# Patient Record
Sex: Female | Born: 1950
Health system: Southern US, Community
[De-identification: ages and names within clinical notes are randomized; demographics above are authoritative.]

## PROBLEM LIST (undated history)

## (undated) DIAGNOSIS — R112 Nausea with vomiting, unspecified: Secondary | ICD-10-CM

## (undated) DIAGNOSIS — N924 Excessive bleeding in the premenopausal period: Secondary | ICD-10-CM

## (undated) DIAGNOSIS — R0600 Dyspnea, unspecified: Secondary | ICD-10-CM

## (undated) DIAGNOSIS — E119 Type 2 diabetes mellitus without complications: Secondary | ICD-10-CM

## (undated) DIAGNOSIS — I10 Essential (primary) hypertension: Secondary | ICD-10-CM

## (undated) DIAGNOSIS — J45909 Unspecified asthma, uncomplicated: Secondary | ICD-10-CM

## (undated) DIAGNOSIS — R600 Localized edema: Secondary | ICD-10-CM

## (undated) DIAGNOSIS — K219 Gastro-esophageal reflux disease without esophagitis: Secondary | ICD-10-CM

## (undated) DIAGNOSIS — E782 Mixed hyperlipidemia: Secondary | ICD-10-CM

## (undated) DIAGNOSIS — G4730 Sleep apnea, unspecified: Secondary | ICD-10-CM

## (undated) DIAGNOSIS — M797 Fibromyalgia: Secondary | ICD-10-CM

## (undated) DIAGNOSIS — Z9889 Other specified postprocedural states: Secondary | ICD-10-CM

## (undated) DIAGNOSIS — R079 Chest pain, unspecified: Secondary | ICD-10-CM

## (undated) HISTORY — DX: Essential (primary) hypertension: I10

## (undated) HISTORY — DX: Excessive bleeding in the premenopausal period: N92.4

## (undated) HISTORY — DX: Fibromyalgia: M79.7

## (undated) HISTORY — PX: CHOLECYSTECTOMY: SHX55

## (undated) HISTORY — DX: Chest pain, unspecified: R07.9

## (undated) HISTORY — DX: Dyspnea, unspecified: R06.00

## (undated) HISTORY — PX: ABDOMINAL HYSTERECTOMY: SHX81

## (undated) HISTORY — DX: Mixed hyperlipidemia: E78.2

## (undated) HISTORY — PX: DILATION AND CURETTAGE OF UTERUS: SHX78

## (undated) HISTORY — DX: Gastro-esophageal reflux disease without esophagitis: K21.9

## (undated) HISTORY — PX: TONSILLECTOMY: SUR1361

## (undated) HISTORY — DX: Localized edema: R60.0

## (undated) HISTORY — DX: Unspecified asthma, uncomplicated: J45.909

## (undated) HISTORY — DX: Type 2 diabetes mellitus without complications: E11.9

---

## 2007-12-17 ENCOUNTER — Emergency Department (HOSPITAL_COMMUNITY): Admission: EM | Admit: 2007-12-17 | Discharge: 2007-12-17 | Payer: Self-pay | Admitting: Internal Medicine

## 2011-07-06 LAB — CREATININE, SERUM
Creatinine, Ser: 1.23 — ABNORMAL HIGH
GFR calc Af Amer: 55 — ABNORMAL LOW
GFR calc non Af Amer: 46 — ABNORMAL LOW

## 2014-12-31 ENCOUNTER — Emergency Department (INDEPENDENT_AMBULATORY_CARE_PROVIDER_SITE_OTHER): Payer: 59

## 2014-12-31 ENCOUNTER — Encounter (HOSPITAL_COMMUNITY): Payer: Self-pay | Admitting: Emergency Medicine

## 2014-12-31 ENCOUNTER — Emergency Department (HOSPITAL_COMMUNITY)
Admission: EM | Admit: 2014-12-31 | Discharge: 2014-12-31 | Disposition: A | Discharge status: Home / Self Care | Payer: 59 | Source: Home / Self Care | Attending: Emergency Medicine | Admitting: Emergency Medicine

## 2014-12-31 DIAGNOSIS — S52511A Displaced fracture of right radial styloid process, initial encounter for closed fracture: Secondary | ICD-10-CM

## 2014-12-31 HISTORY — DX: Essential (primary) hypertension: I10

## 2014-12-31 HISTORY — DX: Type 2 diabetes mellitus without complications: E11.9

## 2014-12-31 NOTE — Discharge Instructions (Signed)
You have a broken wrist. Take Tylenol or ibuprofen for pain. Follow-up with the orthopedic doctor later this week.

## 2014-12-31 NOTE — Progress Notes (Signed)
Orthopedic Tech Progress Note Patient Details:  Ebony Stevens Apr 07, 1951 [id number] Applied fiberglass sugar tong splint to RUE.  Pulses, sensation, motion intact before and after splinting.  Capillary refill less than 2 seconds before and after splinting.  Placed splinted RUE in arm sling. Ortho Devices Type of Ortho Device: Arm sling, Sugartong splint Ortho Device/Splint Location: RUE Ortho Device/Splint Interventions: Application   Darrol Poke 12/31/2014, 8:40 PM

## 2014-12-31 NOTE — ED Provider Notes (Signed)
CSN: [id number]     Arrival date & time 12/31/14  1733 History   First MD Initiated Contact with Patient 12/31/14 1905     Chief Complaint  Patient presents with  . Wrist Injury   (Consider location/radiation/quality/duration/timing/severity/associated sxs/prior Treatment) HPI She is a 64 year old woman here for evaluation of right wrist pain. She states she tripped and fell on Friday landing on her outstretched hands.  She states she sprained her right ankle and also fell on her right ribs. The wrist is the worst of the pain. She has been wearing brace over the weekend and has taken some extra strength Tylenol with minimal improvement. 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 There is swelling of the wrist.  Past Medical History  Diagnosis Date  . Hypertension   . Diabetes mellitus without complication    Past Surgical History  Procedure Laterality Date  . Cholecystectomy    . Abdominal hysterectomy    . Tonsillectomy     History reviewed. No pertinent family history. History  Substance Use Topics  . Smoking status: Never Smoker   . Smokeless tobacco: Never Used  . Alcohol Use: No   OB History    No data available     Review of Systems As in history of present illness Allergies  Review of patient's allergies indicates no known allergies.  Home Medications   Prior to Admission medications   Medication Sig Start Date End Date Taking? Authorizing Provider  glipiZIDE (GLUCOTROL) 5 MG tablet Take 5 mg by mouth 2 (two) times daily before a meal.   Yes Historical Provider, MD  lisinopril (PRINIVIL,ZESTRIL) 20 MG tablet Take 20 mg by mouth daily.   Yes Historical Provider, MD  metFORMIN (GLUMETZA) 1000 MG (MOD) 24 hr tablet Take 1,000 mg by mouth 2 (two) times daily with a meal.   Yes Historical Provider, MD  metoprolol succinate (TOPROL-XL) 50 MG 24 hr tablet Take 50 mg by mouth daily. Take with or immediately following  a meal.   Yes Historical Provider, MD   BP 176/68 mmHg  Pulse 58  Temp(Src) 98.5 F (36.9 C) (Oral)  Resp 15  SpO2 100% Physical Exam  Constitutional: She is oriented to person, place, and time. She appears well-developed and well-nourished. No distress.  Cardiovascular: Normal rate.   Pulmonary/Chest: Effort normal.  Musculoskeletal:  Right hand: She has bruising and swelling over the dorsal hand. She is also tender over the carpal bones. Brisk cap refill. Right wrist: She has some mild swelling on the radial side. She is exquisitely tender over the radial styloid.  Neurological: She is alert and oriented to person, place, and time.    ED Course  Procedures (including critical care time) Labs Review Labs Reviewed - No data to display  Imaging Review Dg Wrist Complete Right  12/31/2014   CLINICAL DATA:  Status post fall a few days ago with bruising of the wrist  EXAM: RIGHT WRIST - COMPLETE 3+ VIEW  COMPARISON:  None.  FINDINGS: There is mild displaced fracture of the distal radius. There is no dislocation.  IMPRESSION: Mild displaced fracture of distal radius.   Electronically Signed   By: Abelardo Diesel M.D.   On: 12/31/2014 19:46   Dg Hand Complete Right  12/31/2014   CLINICAL DATA:  Hand injury  EXAM: RIGHT HAND - COMPLETE 3+ VIEW  COMPARISON:  None.  FINDINGS: There is displaced fracture of the distal radius.  There is no dislocation. No other fracture is identified.  IMPRESSION: Displaced fracture of the distal radius.   Electronically Signed   By: Abelardo Diesel M.D.   On: 12/31/2014 19:52     MDM   1. Radial styloid fracture, right, closed, initial encounter    We'll have Ortho tech place sugar tong splint. She will follow-up with Dr. Lorin Mercy, orthopedic surgeon on call, later this week.    Melony Overly, MD 12/31/14 2000

## 2014-12-31 NOTE — ED Notes (Signed)
Pt fell on Friday and sustained several injuries including her wrist.  She is unable to use the wrist or lift anything.

## 2015-07-11 DIAGNOSIS — R079 Chest pain, unspecified: Secondary | ICD-10-CM

## 2015-07-11 DIAGNOSIS — R0600 Dyspnea, unspecified: Secondary | ICD-10-CM

## 2015-07-11 HISTORY — DX: Dyspnea, unspecified: R06.00

## 2015-07-11 HISTORY — DX: Chest pain, unspecified: R07.9

## 2016-03-27 DIAGNOSIS — E782 Mixed hyperlipidemia: Secondary | ICD-10-CM

## 2016-03-27 DIAGNOSIS — R0609 Other forms of dyspnea: Secondary | ICD-10-CM | POA: Insufficient documentation

## 2016-03-27 DIAGNOSIS — L259 Unspecified contact dermatitis, unspecified cause: Secondary | ICD-10-CM | POA: Insufficient documentation

## 2016-03-27 DIAGNOSIS — R600 Localized edema: Secondary | ICD-10-CM

## 2016-03-27 DIAGNOSIS — E119 Type 2 diabetes mellitus without complications: Secondary | ICD-10-CM | POA: Insufficient documentation

## 2016-03-27 DIAGNOSIS — F341 Dysthymic disorder: Secondary | ICD-10-CM | POA: Insufficient documentation

## 2016-03-27 DIAGNOSIS — E669 Obesity, unspecified: Secondary | ICD-10-CM

## 2016-03-27 DIAGNOSIS — R05 Cough: Secondary | ICD-10-CM | POA: Insufficient documentation

## 2016-03-27 DIAGNOSIS — I10 Essential (primary) hypertension: Secondary | ICD-10-CM | POA: Insufficient documentation

## 2016-03-27 DIAGNOSIS — R079 Chest pain, unspecified: Secondary | ICD-10-CM | POA: Insufficient documentation

## 2016-03-27 DIAGNOSIS — J3089 Other allergic rhinitis: Secondary | ICD-10-CM

## 2016-03-27 DIAGNOSIS — R0789 Other chest pain: Secondary | ICD-10-CM

## 2016-03-27 DIAGNOSIS — R0600 Dyspnea, unspecified: Secondary | ICD-10-CM

## 2016-03-27 DIAGNOSIS — F329 Major depressive disorder, single episode, unspecified: Secondary | ICD-10-CM | POA: Insufficient documentation

## 2016-03-27 DIAGNOSIS — G4700 Insomnia, unspecified: Secondary | ICD-10-CM | POA: Insufficient documentation

## 2016-03-27 DIAGNOSIS — M797 Fibromyalgia: Secondary | ICD-10-CM

## 2016-03-27 DIAGNOSIS — J209 Acute bronchitis, unspecified: Secondary | ICD-10-CM | POA: Insufficient documentation

## 2016-03-27 DIAGNOSIS — J309 Allergic rhinitis, unspecified: Secondary | ICD-10-CM | POA: Insufficient documentation

## 2016-03-27 DIAGNOSIS — R1010 Upper abdominal pain, unspecified: Secondary | ICD-10-CM | POA: Insufficient documentation

## 2016-03-27 DIAGNOSIS — R059 Cough, unspecified: Secondary | ICD-10-CM | POA: Insufficient documentation

## 2016-03-27 DIAGNOSIS — N959 Unspecified menopausal and perimenopausal disorder: Secondary | ICD-10-CM

## 2016-03-27 DIAGNOSIS — K219 Gastro-esophageal reflux disease without esophagitis: Secondary | ICD-10-CM | POA: Insufficient documentation

## 2016-03-27 DIAGNOSIS — J45909 Unspecified asthma, uncomplicated: Secondary | ICD-10-CM | POA: Insufficient documentation

## 2016-03-27 DIAGNOSIS — F411 Generalized anxiety disorder: Secondary | ICD-10-CM

## 2016-04-20 ENCOUNTER — Ambulatory Visit: Payer: Self-pay | Admitting: Internal Medicine

## 2016-04-24 ENCOUNTER — Ambulatory Visit: Payer: Self-pay | Admitting: Internal Medicine

## 2016-04-30 ENCOUNTER — Ambulatory Visit: Payer: Self-pay | Admitting: Cardiology

## 2016-06-05 ENCOUNTER — Ambulatory Visit: Payer: Self-pay | Admitting: Internal Medicine

## 2016-06-05 ENCOUNTER — Encounter (INDEPENDENT_AMBULATORY_CARE_PROVIDER_SITE_OTHER): Payer: Self-pay

## 2016-06-05 ENCOUNTER — Encounter: Payer: Self-pay | Admitting: Internal Medicine

## 2016-06-05 ENCOUNTER — Ambulatory Visit (INDEPENDENT_AMBULATORY_CARE_PROVIDER_SITE_OTHER): Payer: BLUE CROSS/BLUE SHIELD | Admitting: Internal Medicine

## 2016-06-05 VITALS — BP 128/70 | HR 62 | Ht 62.0 in | Wt 253.0 lb

## 2016-06-05 DIAGNOSIS — R0602 Shortness of breath: Secondary | ICD-10-CM | POA: Diagnosis not present

## 2016-06-05 DIAGNOSIS — Z79899 Other long term (current) drug therapy: Secondary | ICD-10-CM

## 2016-06-05 DIAGNOSIS — Z136 Encounter for screening for cardiovascular disorders: Secondary | ICD-10-CM

## 2016-06-05 NOTE — Patient Instructions (Signed)
Your physician recommends that you schedule a follow-up appointment in: to be determined after test   Your physician has requested that you have an echocardiogram. Echocardiography is a painless test that uses sound waves to create images of your heart. It provides your doctor with information about the size and shape of your heart and how well your heart's chambers and valves are working. This procedure takes approximately one hour. There are no restrictions for this procedure.     Thank you for choosing [facility] !

## 2016-06-05 NOTE — Progress Notes (Signed)
Cardiology Office Note   Date:  06/05/2016   ID:  Ebony Stevens, DOB [birth year], MRN [id number]  PCP:  Guy Begin, [place]  Cardiologist:   Dorris Carnes, MD   Pt referred by [facility] for edema   History of Present Illness: Ebony Stevens is a 65 y.o. female with a history of edema Pt says it began about 1 year ago   Occasional CP  Not associated with activity   Does get SOB with activity Watches salt intake  Seen in medicine clinic recently  Labs drawn  Discussed echo  Not done LDL is 97  HDL 58  Renal function normal  TSH and CBC not sent  A1C 7.6       Outpatient Medications Prior to Visit  Medication Sig Dispense Refill  . glipiZIDE (GLUCOTROL) 5 MG tablet Take 5 mg by mouth 2 (two) times daily before a meal.    . lisinopril (PRINIVIL,ZESTRIL) 20 MG tablet Take 20 mg by mouth daily.    . metFORMIN (GLUMETZA) 1000 MG (MOD) 24 hr tablet Take 1,000 mg by mouth 2 (two) times daily with a meal.    . metoprolol succinate (TOPROL-XL) 50 MG 24 hr tablet Take 50 mg by mouth daily. Take with or immediately following a meal.     No facility-administered medications prior to visit.      Allergies:   Review of patient's allergies indicates no known allergies.   Past Medical History:  Diagnosis Date  . Diabetes mellitus without complication ([place])   . Hypertension     Past Surgical History:  Procedure Laterality Date  . ABDOMINAL HYSTERECTOMY    . CHOLECYSTECTOMY    . TONSILLECTOMY       Social History:  The patient  reports that she has never smoked. She has never used smokeless tobacco. She reports that she does not drink alcohol or use drugs.   Family History:  The patient's family history includes Cancer in her sister; Diabetes in her brother.    ROS:  Please see the history of present illness. All other systems are reviewed and  Negative to the above problem except as noted.    PHYSICAL EXAM: VS:  BP 128/70 (BP Location: Left Arm)   Pulse 62   Ht 5\' 2"   (1.575 m)   Wt 253 lb (114.8 kg)   SpO2 94%   BMI 46.27 kg/m   GEN: Well nourished, well developed, in no acute distress  HEENT: normal  Neck: no JVD, carotid bruits, or masses Cardiac: RRR; no murmurs, rubs, or gallops,1+ edema  Respiratory:  clear to auscultation bilaterally, normal work of breathing GI: soft, nontender, nondistended, + BS  No hepatomegaly  MS: no deformity Moving all extremities   Skin: warm and dry, no rash Neuro:  Strength and sensation are intact Psych: euthymic mood, full affect   EKG:  EKG is ordered today.  SB 48 bpm  Sl ST depression, biphasic T waves II, III, AVF, V5 V6   Lipid Panel No results found for: CHOL, TRIG, HDL, CHOLHDL, VLDL, LDLCALC, LDLDIRECT    Wt Readings from Last 3 Encounters:  06/05/16 253 lb (114.8 kg)      ASSESSMENT AND PLAN: 1  Edema  Volume status on exam overall is not bad  I would get labs from [facility] (TSH and CBC)  I would also set up forecho  Watch salt   Activity as tolerated  2.  DM  A1C 7.6  Could be better  Discussed  diet  3.  HL  Lipids are not bad  Follow    F/U will be based on test results       Signed, Dorris Carnes, MD  06/05/2016 10:29 AM    [facility] [address], [place], [place]  [address] Phone: [phone]; Fax: [phone]

## 2016-06-08 ENCOUNTER — Encounter: Payer: Self-pay | Admitting: Internal Medicine

## 2016-06-18 ENCOUNTER — Ambulatory Visit (HOSPITAL_COMMUNITY)
Admission: RE | Admit: 2016-06-18 | Discharge: 2016-06-18 | Disposition: A | Discharge status: Home / Self Care | Payer: BLUE CROSS/BLUE SHIELD | Source: Ambulatory Visit | Attending: Internal Medicine | Admitting: Internal Medicine

## 2016-06-18 DIAGNOSIS — K219 Gastro-esophageal reflux disease without esophagitis: Secondary | ICD-10-CM | POA: Insufficient documentation

## 2016-06-18 DIAGNOSIS — E119 Type 2 diabetes mellitus without complications: Secondary | ICD-10-CM | POA: Diagnosis not present

## 2016-06-18 DIAGNOSIS — I119 Hypertensive heart disease without heart failure: Secondary | ICD-10-CM | POA: Diagnosis not present

## 2016-06-18 DIAGNOSIS — R0602 Shortness of breath: Secondary | ICD-10-CM | POA: Insufficient documentation

## 2016-06-18 DIAGNOSIS — E785 Hyperlipidemia, unspecified: Secondary | ICD-10-CM | POA: Insufficient documentation

## 2016-06-18 DIAGNOSIS — Z6841 Body Mass Index (BMI) 40.0 and over, adult: Secondary | ICD-10-CM | POA: Diagnosis not present

## 2016-06-18 DIAGNOSIS — R0600 Dyspnea, unspecified: Secondary | ICD-10-CM | POA: Diagnosis present

## 2016-06-18 LAB — ECHOCARDIOGRAM COMPLETE
CHL CUP MV DEC (S): 239
CHL CUP RV SYS PRESS: 24 mmHg
CHL CUP STROKE VOLUME: 38 mL
E decel time: 239 msec
EERAT: 15.24
FS: 41 % (ref 28–44)
IV/PV OW: 1.05
LA diam index: 1.82 cm/m2
LA vol A4C: 42.1 ml
LA vol index: 20.5 mL/m2
LASIZE: 42 mm
LAVOL: 47.4 mL
LDCA: 2.84 cm2
LEFT ATRIUM END SYS DIAM: 42 mm
LV TDI E'LATERAL: 7.94
LV sys vol index: 7 mL/m2
LV sys vol: 17 mL (ref 14–42)
LVDIAVOL: 54 mL (ref 46–106)
LVDIAVOLIN: 24 mL/m2
LVEEAVG: 15.24
LVEEMED: 15.24
LVELAT: 7.94 cm/s
LVOT SV: 92 mL
LVOT VTI: 32.3 cm
LVOT diameter: 19 mm
LVOT peak grad rest: 6 mmHg
LVOTPV: 124 cm/s
MV pk E vel: 121 m/s
MVPG: 6 mmHg
MVPKAVEL: 66 m/s
PW: 13 mm — AB (ref 0.6–1.1)
RV LATERAL S' VELOCITY: 11.9 cm/s
Reg peak vel: 227 cm/s
Simpson's disk: 69
TAPSE: 24.3 mm
TDI e' medial: 5.11
TR max vel: 227 cm/s

## 2016-06-18 NOTE — Progress Notes (Signed)
*  PRELIMINARY RESULTS* Echocardiogram 2D Echocardiogram has been performed.  Ebony Stevens 06/18/2016, 2:08 PM

## 2016-06-22 MED ORDER — FUROSEMIDE 40 MG PO TABS: 40.0000 mg | ORAL_TABLET | ORAL | 2 refills | Status: DC

## 2016-06-22 MED ORDER — POTASSIUM CHLORIDE ER 10 MEQ PO TBCR: 10.0000 meq | EXTENDED_RELEASE_TABLET | ORAL | 2 refills | Status: DC

## 2016-06-22 NOTE — Telephone Encounter (Signed)
Called pt., no answer- left message for pt to return my call. Sent in RX for lasix 40 mg & potassium 10 mg to take every other day. Also put in lab orders for labs to be done in two weeks.

## 2016-06-22 NOTE — Telephone Encounter (Signed)
-----   Message from Fay Records, MD sent at 06/20/2016  8:45 PM EDT ----- Echo shows normal pumping function of hte heart  No signif valve abnormalitiees INcreased filling pressuress I would recomm a trial of lasix 40 mg with 10 KCL every other day Check BMET and BNP in 2 wks

## 2016-06-23 ENCOUNTER — Telehealth: Payer: Self-pay

## 2016-06-23 NOTE — Telephone Encounter (Signed)
E-scribed increased lasix dose,mailed lab slips,lm for pt to call back

## 2016-06-23 NOTE — Telephone Encounter (Signed)
-----   Message from Fay Records, MD sent at 06/20/2016  8:45 PM EDT ----- Echo shows normal pumping function of hte heart  No signif valve abnormalitiees INcreased filling pressuress I would recomm a trial of lasix 40 mg with 10 KCL every other day Check BMET and BNP in 2 wks

## 2016-06-29 ENCOUNTER — Telehealth: Payer: Self-pay | Admitting: Internal Medicine

## 2016-06-29 ENCOUNTER — Other Ambulatory Visit: Payer: Self-pay

## 2016-06-29 NOTE — Telephone Encounter (Signed)
Sent My chart message from Dr Ross,e-scribed meds and place lab slips

## 2016-06-29 NOTE — Telephone Encounter (Signed)
Pt called returning Cathey's call

## 2016-06-30 NOTE — Telephone Encounter (Signed)
I left vm and told her her instructions from Dr Harrington Challenger were sent to [facility] Chart

## 2016-07-13 ENCOUNTER — Other Ambulatory Visit: Payer: Self-pay

## 2016-07-16 LAB — BRAIN NATRIURETIC PEPTIDE: BRAIN NATRIURETIC PEPTIDE: 46.7 pg/mL (ref <100)

## 2016-07-18 LAB — BASIC METABOLIC PANEL
BUN: 20 mg/dL (ref 7–25)
CALCIUM: 8.3 mg/dL — AB (ref 8.6–10.4)
CO2: 27 mmol/L (ref 20–31)
CREATININE: 0.86 mg/dL (ref 0.50–0.99)
Chloride: 104 mmol/L (ref 98–110)
Glucose, Bld: 96 mg/dL (ref 65–99)
Potassium: 4.1 mmol/L (ref 3.5–5.3)
SODIUM: 137 mmol/L (ref 135–146)

## 2016-07-20 ENCOUNTER — Telehealth: Payer: Self-pay

## 2016-07-20 NOTE — Telephone Encounter (Signed)
Given results of recent labs and per dr Harrington Challenger take lasix QOD,pt has refills

## 2016-07-28 ENCOUNTER — Encounter: Payer: Self-pay | Admitting: *Deleted

## 2016-08-03 ENCOUNTER — Telehealth: Payer: Self-pay | Admitting: Internal Medicine

## 2016-08-03 NOTE — Telephone Encounter (Signed)
The patient called back today to discuss her lab results due to a letter that was received.  BMP/BNP discussed with the patient from 07/15/16. She confirmed she is taking lasix 40 mg once every other day.

## 2017-05-28 ENCOUNTER — Other Ambulatory Visit: Payer: Self-pay | Admitting: Internal Medicine

## 2017-12-13 ENCOUNTER — Other Ambulatory Visit (HOSPITAL_COMMUNITY): Payer: Self-pay | Admitting: Internal Medicine

## 2017-12-13 DIAGNOSIS — IMO0001 Reserved for inherently not codable concepts without codable children: Secondary | ICD-10-CM

## 2017-12-13 DIAGNOSIS — K7689 Other specified diseases of liver: Secondary | ICD-10-CM

## 2017-12-16 ENCOUNTER — Other Ambulatory Visit (HOSPITAL_COMMUNITY): Payer: Self-pay | Admitting: Internal Medicine

## 2017-12-16 DIAGNOSIS — Z1231 Encounter for screening mammogram for malignant neoplasm of breast: Secondary | ICD-10-CM

## 2017-12-16 DIAGNOSIS — Z780 Asymptomatic menopausal state: Secondary | ICD-10-CM

## 2017-12-29 ENCOUNTER — Ambulatory Visit (HOSPITAL_COMMUNITY): Payer: Self-pay

## 2017-12-29 ENCOUNTER — Other Ambulatory Visit (HOSPITAL_COMMUNITY): Payer: Self-pay

## 2018-01-19 ENCOUNTER — Ambulatory Visit (HOSPITAL_COMMUNITY): Payer: BLUE CROSS/BLUE SHIELD

## 2018-02-03 ENCOUNTER — Ambulatory Visit (HOSPITAL_COMMUNITY)
Admission: RE | Admit: 2018-02-03 | Discharge: 2018-02-03 | Disposition: A | Discharge status: Home / Self Care | Payer: Medicare HMO | Source: Ambulatory Visit | Attending: Internal Medicine | Admitting: Internal Medicine

## 2018-02-03 ENCOUNTER — Encounter (HOSPITAL_COMMUNITY): Payer: Self-pay

## 2018-02-03 DIAGNOSIS — K7689 Other specified diseases of liver: Secondary | ICD-10-CM | POA: Insufficient documentation

## 2018-02-03 DIAGNOSIS — R932 Abnormal findings on diagnostic imaging of liver and biliary tract: Secondary | ICD-10-CM | POA: Diagnosis not present

## 2018-02-03 DIAGNOSIS — Z780 Asymptomatic menopausal state: Secondary | ICD-10-CM | POA: Insufficient documentation

## 2018-02-03 DIAGNOSIS — N2889 Other specified disorders of kidney and ureter: Secondary | ICD-10-CM | POA: Insufficient documentation

## 2018-02-03 DIAGNOSIS — M85851 Other specified disorders of bone density and structure, right thigh: Secondary | ICD-10-CM | POA: Insufficient documentation

## 2018-02-03 DIAGNOSIS — Z1231 Encounter for screening mammogram for malignant neoplasm of breast: Secondary | ICD-10-CM | POA: Insufficient documentation

## 2018-02-03 DIAGNOSIS — IMO0001 Reserved for inherently not codable concepts without codable children: Secondary | ICD-10-CM

## 2018-02-11 ENCOUNTER — Other Ambulatory Visit (HOSPITAL_COMMUNITY): Payer: BLUE CROSS/BLUE SHIELD

## 2018-02-11 ENCOUNTER — Other Ambulatory Visit (HOSPITAL_COMMUNITY): Payer: Self-pay

## 2018-02-24 ENCOUNTER — Other Ambulatory Visit (HOSPITAL_COMMUNITY): Payer: Self-pay | Admitting: Internal Medicine

## 2018-02-24 DIAGNOSIS — R160 Hepatomegaly, not elsewhere classified: Secondary | ICD-10-CM

## 2018-02-24 DIAGNOSIS — R935 Abnormal findings on diagnostic imaging of other abdominal regions, including retroperitoneum: Secondary | ICD-10-CM

## 2018-02-28 ENCOUNTER — Ambulatory Visit (HOSPITAL_COMMUNITY)
Admission: RE | Admit: 2018-02-28 | Discharge: 2018-02-28 | Disposition: A | Discharge status: Home / Self Care | Payer: Medicare HMO | Source: Ambulatory Visit | Attending: Internal Medicine | Admitting: Internal Medicine

## 2018-02-28 DIAGNOSIS — K760 Fatty (change of) liver, not elsewhere classified: Secondary | ICD-10-CM | POA: Diagnosis not present

## 2018-02-28 DIAGNOSIS — I700 Atherosclerosis of aorta: Secondary | ICD-10-CM | POA: Diagnosis not present

## 2018-02-28 DIAGNOSIS — D1771 Benign lipomatous neoplasm of kidney: Secondary | ICD-10-CM | POA: Insufficient documentation

## 2018-02-28 DIAGNOSIS — R160 Hepatomegaly, not elsewhere classified: Secondary | ICD-10-CM | POA: Insufficient documentation

## 2018-02-28 DIAGNOSIS — R911 Solitary pulmonary nodule: Secondary | ICD-10-CM | POA: Diagnosis not present

## 2018-02-28 DIAGNOSIS — R935 Abnormal findings on diagnostic imaging of other abdominal regions, including retroperitoneum: Secondary | ICD-10-CM | POA: Insufficient documentation

## 2018-02-28 DIAGNOSIS — K449 Diaphragmatic hernia without obstruction or gangrene: Secondary | ICD-10-CM | POA: Diagnosis not present

## 2018-02-28 LAB — POCT I-STAT CREATININE: Creatinine, Ser: 1.1 mg/dL — ABNORMAL HIGH (ref 0.44–1.00)

## 2018-02-28 MED ORDER — IOPAMIDOL (ISOVUE-300) INJECTION 61%
100.0000 mL | Freq: Once | INTRAVENOUS | Status: AC | PRN
  Administered 2018-02-28: 100 mL via INTRAVENOUS

## 2018-05-12 DIAGNOSIS — E119 Type 2 diabetes mellitus without complications: Secondary | ICD-10-CM | POA: Diagnosis not present

## 2018-05-12 DIAGNOSIS — I10 Essential (primary) hypertension: Secondary | ICD-10-CM | POA: Diagnosis not present

## 2018-05-12 DIAGNOSIS — Z0000 Encounter for general adult medical examination without abnormal findings: Secondary | ICD-10-CM | POA: Diagnosis not present

## 2018-05-12 DIAGNOSIS — E782 Mixed hyperlipidemia: Secondary | ICD-10-CM | POA: Diagnosis not present

## 2018-05-13 DIAGNOSIS — R197 Diarrhea, unspecified: Secondary | ICD-10-CM | POA: Diagnosis not present

## 2018-05-13 DIAGNOSIS — E1165 Type 2 diabetes mellitus with hyperglycemia: Secondary | ICD-10-CM | POA: Diagnosis not present

## 2018-05-13 DIAGNOSIS — N951 Menopausal and female climacteric states: Secondary | ICD-10-CM | POA: Diagnosis not present

## 2018-05-13 DIAGNOSIS — M545 Low back pain: Secondary | ICD-10-CM | POA: Diagnosis not present

## 2018-05-13 DIAGNOSIS — K219 Gastro-esophageal reflux disease without esophagitis: Secondary | ICD-10-CM | POA: Diagnosis not present

## 2018-05-13 DIAGNOSIS — K760 Fatty (change of) liver, not elsewhere classified: Secondary | ICD-10-CM | POA: Diagnosis not present

## 2018-05-13 DIAGNOSIS — M25551 Pain in right hip: Secondary | ICD-10-CM | POA: Diagnosis not present

## 2018-05-13 DIAGNOSIS — I10 Essential (primary) hypertension: Secondary | ICD-10-CM | POA: Diagnosis not present

## 2018-05-13 DIAGNOSIS — E782 Mixed hyperlipidemia: Secondary | ICD-10-CM | POA: Diagnosis not present

## 2018-05-30 DIAGNOSIS — L918 Other hypertrophic disorders of the skin: Secondary | ICD-10-CM | POA: Diagnosis not present

## 2018-06-20 DIAGNOSIS — Z6841 Body Mass Index (BMI) 40.0 and over, adult: Secondary | ICD-10-CM | POA: Diagnosis not present

## 2018-06-20 DIAGNOSIS — L918 Other hypertrophic disorders of the skin: Secondary | ICD-10-CM | POA: Diagnosis not present

## 2018-06-27 DIAGNOSIS — E119 Type 2 diabetes mellitus without complications: Secondary | ICD-10-CM | POA: Diagnosis not present

## 2018-06-27 DIAGNOSIS — I509 Heart failure, unspecified: Secondary | ICD-10-CM | POA: Diagnosis not present

## 2018-06-27 DIAGNOSIS — I10 Essential (primary) hypertension: Secondary | ICD-10-CM | POA: Diagnosis not present

## 2018-06-27 DIAGNOSIS — E782 Mixed hyperlipidemia: Secondary | ICD-10-CM | POA: Diagnosis not present

## 2018-06-27 DIAGNOSIS — K219 Gastro-esophageal reflux disease without esophagitis: Secondary | ICD-10-CM | POA: Diagnosis not present

## 2018-06-27 DIAGNOSIS — E1165 Type 2 diabetes mellitus with hyperglycemia: Secondary | ICD-10-CM | POA: Diagnosis not present

## 2018-06-28 ENCOUNTER — Other Ambulatory Visit (HOSPITAL_BASED_OUTPATIENT_CLINIC_OR_DEPARTMENT_OTHER): Payer: Self-pay

## 2018-06-28 DIAGNOSIS — G4733 Obstructive sleep apnea (adult) (pediatric): Secondary | ICD-10-CM

## 2018-06-30 ENCOUNTER — Ambulatory Visit: Payer: Medicare HMO | Attending: Internal Medicine | Admitting: Neurology

## 2018-06-30 DIAGNOSIS — Z79899 Other long term (current) drug therapy: Secondary | ICD-10-CM | POA: Insufficient documentation

## 2018-06-30 DIAGNOSIS — Z794 Long term (current) use of insulin: Secondary | ICD-10-CM | POA: Insufficient documentation

## 2018-06-30 DIAGNOSIS — G4733 Obstructive sleep apnea (adult) (pediatric): Secondary | ICD-10-CM | POA: Insufficient documentation

## 2018-07-02 NOTE — Procedures (Signed)
Leonardo A. Merlene Laughter, MD     [email or web address]             NOCTURNAL POLYSOMNOGRAPHY   LOCATION: [facility]   Patient Name: Ebony Stevens, Ebony Stevens Date: 06/30/2018 Gender: Female D.O.B: 1951-06-21 Age (years): 72 Referring Provider: Delphina Cahill Height (inches): 62 Interpreting Physician: Phillips Odor MD, ABSM Weight (lbs): 266 RPSGT: Peak, Robert BMI: 49 MRN: [id number] Neck Size: 19.00 CLINICAL INFORMATION Sleep Study Type: NPSG     Indication for sleep study: OSA     Epworth Sleepiness Score: 15     SLEEP STUDY TECHNIQUE As per the AASM Manual for the Scoring of Sleep and Associated Events v2.3 (April 2016) with a hypopnea requiring 4% desaturations.  The channels recorded and monitored were frontal, central and occipital EEG, electrooculogram (EOG), submentalis EMG (chin), nasal and oral airflow, thoracic and abdominal wall motion, anterior tibialis EMG, snore microphone, electrocardiogram, and pulse oximetry.  MEDICATIONS Medications self-administered by patient taken the night of the study : N/A  Current Outpatient Medications:  .  Aclidinium Bromide (TUDORZA PRESSAIR) 400 MCG/ACT AEPB, Inhale 1 puff into the lungs 2 (two) times daily., Disp: , Rfl:  .  Aclidinium Bromide 400 MCG/ACT AEPB, Inhale 1 puff into the lungs 2 (two) times daily., Disp: , Rfl:  .  albuterol (PROVENTIL HFA) 108 (90 Base) MCG/ACT inhaler, Inhale 2 puffs into the lungs every 6 (six) hours as needed for wheezing or shortness of breath., Disp: , Rfl:  .  albuterol (PROVENTIL HFA;VENTOLIN HFA) 108 (90 Base) MCG/ACT inhaler, Inhale 2 puffs into the lungs every 6 (six) hours as needed for wheezing or shortness of breath., Disp: , Rfl:  .  amLODipine (NORVASC) 5 MG tablet, Take 5 mg by mouth daily., Disp: , Rfl:  .  amLODipine (NORVASC) 5 MG tablet, Take 5 mg by mouth daily., Disp: , Rfl:  .  cetirizine (ZYRTEC) 10 MG tablet, Take 10 mg by mouth daily., Disp: , Rfl:  .   cetirizine (ZYRTEC) 10 MG tablet, Take 10 mg by mouth at bedtime., Disp: , Rfl:  .  estradiol (ESTRACE) 2 MG tablet, Take 2 mg by mouth daily., Disp: , Rfl:  .  estradiol (ESTRACE) 2 MG tablet, Take 2 mg by mouth daily., Disp: , Rfl:  .  furosemide (LASIX) 40 MG tablet, TAKE ONE TABLET BY MOUTH EVERY OTHER DAY, Disp: 15 tablet, Rfl: 2 .  glipiZIDE (GLUCOTROL) 10 MG tablet, Take 10 mg by mouth 2 (two) times daily before a meal., Disp: , Rfl:  .  glipiZIDE (GLUCOTROL) 5 MG tablet, Take 5 mg by mouth 2 (two) times daily before a meal., Disp: , Rfl:  .  Insulin Degludec (TRESIBA FLEXTOUCH) 200 UNIT/ML SOPN, Inject 45 Units into the skin., Disp: , Rfl:  .  insulin lispro (HUMALOG) 100 UNIT/ML injection, Inject 10 Units into the skin 3 (three) times daily before meals., Disp: , Rfl:  .  insulin lispro (HUMALOG) 100 UNIT/ML injection, Inject 10 Units into the skin 3 (three) times daily before meals., Disp: , Rfl:  .  lisinopril (PRINIVIL,ZESTRIL) 20 MG tablet, Take 20 mg by mouth daily., Disp: , Rfl:  .  lisinopril-hydrochlorothiazide (PRINZIDE,ZESTORETIC) 20-12.5 MG tablet, Take 1 tablet by mouth daily., Disp: , Rfl:  .  metFORMIN (GLUCOPHAGE) 1000 MG tablet, Take 1,000 mg by mouth 2 (two) times daily with a meal., Disp: , Rfl:  .  metFORMIN (GLUMETZA) 1000 MG (MOD) 24 hr tablet, Take 1,000 mg by mouth 2 (two) times daily with  a meal., Disp: , Rfl:  .  metoprolol (LOPRESSOR) 100 MG tablet, Take 100 mg by mouth daily., Disp: , Rfl:  .  metoprolol succinate (TOPROL-XL) 50 MG 24 hr tablet, Take 50 mg by mouth daily. Take with or immediately following a meal., Disp: , Rfl:  .  potassium chloride (K-DUR) 10 MEQ tablet, TAKE ONE TABLET BY MOUTH EVERY OTHER DAY, Disp: 15 tablet, Rfl: 2 .  pravastatin (PRAVACHOL) 40 MG tablet, Take 40 mg by mouth 2 (two) times daily., Disp: , Rfl:  .  pravastatin (PRAVACHOL) 40 MG tablet, Take 40 mg by mouth daily. 2 tabs daily, Disp: , Rfl:  .  ranitidine (ZANTAC) 300 MG  tablet, Take 300 mg by mouth at bedtime., Disp: , Rfl:  .  ranitidine (ZANTAC) 300 MG tablet, Take 300 mg by mouth at bedtime., Disp: , Rfl:  .  traMADol (ULTRAM) 50 MG tablet, Take by mouth every 6 (six) hours as needed., Disp: , Rfl:  .  traMADol (ULTRAM) 50 MG tablet, Take by mouth 4 (four) times daily., Disp: , Rfl:      SLEEP ARCHITECTURE The study was initiated at 9:43:41 PM and ended at 4:54:47 AM.  Sleep onset time was 67.1 minutes and the sleep efficiency was 47.0%%. The total sleep time was 202.5 minutes.  Stage REM latency was N/A minutes.  The patient spent 16.8%% of the night in stage N1 sleep, 75.8%% in stage N2 sleep, 7.4%% in stage N3 and 0% in REM.  Alpha intrusion was absent.  Supine sleep was 0.00%.  RESPIRATORY PARAMETERS The overall apnea/hypopnea index (AHI) was 43.3 per hour. There were 64 total apneas, including 60 obstructive, 3 central and 1 mixed apneas. There were 82 hypopneas and 81 RERAs.  The AHI during Stage REM sleep was N/A per hour.  AHI while supine was N/A per hour.  The mean oxygen saturation was 92.2%. The minimum SpO2 during sleep was 81.0%.  loud snoring was noted during this study.  CARDIAC DATA The 2 lead EKG demonstrated sinus rhythm. The mean heart rate was 46.4 beats per minute. Other EKG findings include: None. LEG MOVEMENT DATA The total PLMS were 0 with a resulting PLMS index of 0.0. Associated arousal with leg movement index was 0.0.  IMPRESSIONS 1. Severe obstructive sleep apnea is noted. A formal CPAP study is suggested.  2. Absent REM sleep is noted.   Delano Metz, MD Diplomate, American Board of Sleep Medicine.  ELECTRONICALLY SIGNED ON:  07/02/2018, 6:58 PM [place] PH: [phone]   FX: [phone] [place]

## 2018-07-05 ENCOUNTER — Encounter: Payer: Self-pay | Admitting: Internal Medicine

## 2018-07-25 DIAGNOSIS — G4733 Obstructive sleep apnea (adult) (pediatric): Secondary | ICD-10-CM | POA: Diagnosis not present

## 2018-07-25 DIAGNOSIS — E1140 Type 2 diabetes mellitus with diabetic neuropathy, unspecified: Secondary | ICD-10-CM | POA: Diagnosis not present

## 2018-07-25 DIAGNOSIS — E118 Type 2 diabetes mellitus with unspecified complications: Secondary | ICD-10-CM | POA: Diagnosis not present

## 2018-07-25 DIAGNOSIS — E782 Mixed hyperlipidemia: Secondary | ICD-10-CM | POA: Diagnosis not present

## 2018-08-01 DIAGNOSIS — I10 Essential (primary) hypertension: Secondary | ICD-10-CM | POA: Diagnosis not present

## 2018-08-01 DIAGNOSIS — I509 Heart failure, unspecified: Secondary | ICD-10-CM | POA: Diagnosis not present

## 2018-08-01 DIAGNOSIS — K219 Gastro-esophageal reflux disease without esophagitis: Secondary | ICD-10-CM | POA: Diagnosis not present

## 2018-08-01 DIAGNOSIS — E782 Mixed hyperlipidemia: Secondary | ICD-10-CM | POA: Diagnosis not present

## 2018-08-01 DIAGNOSIS — E1165 Type 2 diabetes mellitus with hyperglycemia: Secondary | ICD-10-CM | POA: Diagnosis not present

## 2018-08-03 ENCOUNTER — Other Ambulatory Visit (HOSPITAL_BASED_OUTPATIENT_CLINIC_OR_DEPARTMENT_OTHER): Payer: Self-pay

## 2018-08-03 DIAGNOSIS — G4733 Obstructive sleep apnea (adult) (pediatric): Secondary | ICD-10-CM

## 2018-08-09 ENCOUNTER — Ambulatory Visit: Payer: Medicare HMO | Attending: Neurology | Admitting: Neurology

## 2018-08-09 DIAGNOSIS — G4733 Obstructive sleep apnea (adult) (pediatric): Secondary | ICD-10-CM

## 2018-08-13 NOTE — Procedures (Signed)
[place] A. Merlene Laughter, MD     [email or web address]             NOCTURNAL POLYSOMNOGRAPHY   LOCATION: [facility]   Patient Name: Ebony Stevens, Ebony Stevens Date: 08/09/2018 Gender: Female D.O.B: [birth year] Age (years): 41 Referring Provider: Phillips Odor MD, ABSM Height (inches): 62 Interpreting Physician: Phillips Odor MD, ABSM Weight (lbs): 266 RPSGT: Rosebud Poles BMI: 49 MRN: [id number] Neck Size: 18.50 CLINICAL INFORMATION The patient is referred for a CPAP titration to treat sleep apnea.  Date of NPSG, Split Night or HST:  SLEEP STUDY TECHNIQUE As per the AASM Manual for the Scoring of Sleep and Associated Events v2.3 (April 2016) with a hypopnea requiring 4% desaturations.  The channels recorded and monitored were frontal, central and occipital EEG, electrooculogram (EOG), submentalis EMG (chin), nasal and oral airflow, thoracic and abdominal wall motion, anterior tibialis EMG, snore microphone, electrocardiogram, and pulse oximetry. Continuous positive airway pressure (CPAP) was initiated at the beginning of the study and titrated to treat sleep-disordered breathing.  MEDICATIONS Medications self-administered by patient taken the night of the study : N/A  Current Outpatient Medications:  .  Aclidinium Bromide (TUDORZA PRESSAIR) 400 MCG/ACT AEPB, Inhale 1 puff into the lungs 2 (two) times daily., Disp: , Rfl:  .  Aclidinium Bromide 400 MCG/ACT AEPB, Inhale 1 puff into the lungs 2 (two) times daily., Disp: , Rfl:  .  albuterol (PROVENTIL HFA) 108 (90 Base) MCG/ACT inhaler, Inhale 2 puffs into the lungs every 6 (six) hours as needed for wheezing or shortness of breath., Disp: , Rfl:  .  albuterol (PROVENTIL HFA;VENTOLIN HFA) 108 (90 Base) MCG/ACT inhaler, Inhale 2 puffs into the lungs every 6 (six) hours as needed for wheezing or shortness of breath., Disp: , Rfl:  .  amLODipine (NORVASC) 5 MG tablet, Take 5 mg by mouth daily., Disp: , Rfl:  .   amLODipine (NORVASC) 5 MG tablet, Take 5 mg by mouth daily., Disp: , Rfl:  .  cetirizine (ZYRTEC) 10 MG tablet, Take 10 mg by mouth daily., Disp: , Rfl:  .  cetirizine (ZYRTEC) 10 MG tablet, Take 10 mg by mouth at bedtime., Disp: , Rfl:  .  estradiol (ESTRACE) 2 MG tablet, Take 2 mg by mouth daily., Disp: , Rfl:  .  estradiol (ESTRACE) 2 MG tablet, Take 2 mg by mouth daily., Disp: , Rfl:  .  furosemide (LASIX) 40 MG tablet, TAKE ONE TABLET BY MOUTH EVERY OTHER DAY, Disp: 15 tablet, Rfl: 2 .  glipiZIDE (GLUCOTROL) 10 MG tablet, Take 10 mg by mouth 2 (two) times daily before a meal., Disp: , Rfl:  .  glipiZIDE (GLUCOTROL) 5 MG tablet, Take 5 mg by mouth 2 (two) times daily before a meal., Disp: , Rfl:  .  Insulin Degludec (TRESIBA FLEXTOUCH) 200 UNIT/ML SOPN, Inject 45 Units into the skin., Disp: , Rfl:  .  insulin lispro (HUMALOG) 100 UNIT/ML injection, Inject 10 Units into the skin 3 (three) times daily before meals., Disp: , Rfl:  .  insulin lispro (HUMALOG) 100 UNIT/ML injection, Inject 10 Units into the skin 3 (three) times daily before meals., Disp: , Rfl:  .  lisinopril (PRINIVIL,ZESTRIL) 20 MG tablet, Take 20 mg by mouth daily., Disp: , Rfl:  .  lisinopril-hydrochlorothiazide (PRINZIDE,ZESTORETIC) 20-12.5 MG tablet, Take 1 tablet by mouth daily., Disp: , Rfl:  .  metFORMIN (GLUCOPHAGE) 1000 MG tablet, Take 1,000 mg by mouth 2 (two) times daily with a meal., Disp: , Rfl:  .  metFORMIN (GLUMETZA) 1000 MG (MOD) 24 hr tablet, Take 1,000 mg by mouth 2 (two) times daily with a meal., Disp: , Rfl:  .  metoprolol (LOPRESSOR) 100 MG tablet, Take 100 mg by mouth daily., Disp: , Rfl:  .  metoprolol succinate (TOPROL-XL) 50 MG 24 hr tablet, Take 50 mg by mouth daily. Take with or immediately following a meal., Disp: , Rfl:  .  potassium chloride (K-DUR) 10 MEQ tablet, TAKE ONE TABLET BY MOUTH EVERY OTHER DAY, Disp: 15 tablet, Rfl: 2 .  pravastatin (PRAVACHOL) 40 MG tablet, Take 40 mg by mouth 2 (two)  times daily., Disp: , Rfl:  .  pravastatin (PRAVACHOL) 40 MG tablet, Take 40 mg by mouth daily. 2 tabs daily, Disp: , Rfl:  .  ranitidine (ZANTAC) 300 MG tablet, Take 300 mg by mouth at bedtime., Disp: , Rfl:  .  ranitidine (ZANTAC) 300 MG tablet, Take 300 mg by mouth at bedtime., Disp: , Rfl:  .  traMADol (ULTRAM) 50 MG tablet, Take by mouth every 6 (six) hours as needed., Disp: , Rfl:  .  traMADol (ULTRAM) 50 MG tablet, Take by mouth 4 (four) times daily., Disp: , Rfl:   TECHNICIAN COMMENTS Comments added by technician: Patient had difficulty initiating sleep. Paitent tolerated CPAP very well. Moderate to loud snoring noticed during first part of therapy titration. CPAP therapy began at 4 cm of H20 and increased to 17 cm of H20, because of events noticed in REM-Supine stages. Suboptimal pressure obtained in supine position, although 17 cm of H20 seemed to control events and snoring episodes in lateral positions Comments added by scorer: N/A  RESPIRATORY PARAMETERS Optimal PAP Pressure (cm): 17 AHI at Optimal Pressure (/hr): 6.5 Overall Minimal O2 (%): 85.0 Supine % at Optimal Pressure (%): 19 Minimal O2 at Optimal Pressure (%): 88.0    SLEEP ARCHITECTURE The study was initiated at 10:22:10 PM and ended at 5:22:42 AM.  Sleep onset time was 76.8 minutes and the sleep efficiency was 75.0%%. The total sleep time was 315.3 minutes.  The patient spent 1.3%% of the night in stage N1 sleep, 34.2%% in stage N2 sleep, 44.7%% in stage N3 and 19.8% in REM.Stage REM latency was 131.5 minutes  Wake after sleep onset was 28.5. Alpha intrusion was absent. Supine sleep was 39.33%.  CARDIAC DATA The 2 lead EKG demonstrated sinus rhythm. The mean heart rate was 46.5 beats per minute. Other EKG findings include: None.   LEG MOVEMENT DATA The total Periodic Limb Movements of Sleep (PLMS) were 0. The PLMS index was 0.0. A PLMS index of <15 is considered normal in adults.  IMPRESSIONS The optimal CPAP is  17 cm of water.    Delano Metz, MD Diplomate, American Board of Sleep Medicine.  ELECTRONICALLY SIGNED ON:  08/13/2018, 1:42 PM [place] PH: [phone]   FX: [phone] [place]

## 2018-08-16 DIAGNOSIS — I10 Essential (primary) hypertension: Secondary | ICD-10-CM | POA: Diagnosis not present

## 2018-08-16 DIAGNOSIS — E1165 Type 2 diabetes mellitus with hyperglycemia: Secondary | ICD-10-CM | POA: Diagnosis not present

## 2018-08-16 DIAGNOSIS — E119 Type 2 diabetes mellitus without complications: Secondary | ICD-10-CM | POA: Diagnosis not present

## 2018-08-16 DIAGNOSIS — E782 Mixed hyperlipidemia: Secondary | ICD-10-CM | POA: Diagnosis not present

## 2018-08-16 DIAGNOSIS — I509 Heart failure, unspecified: Secondary | ICD-10-CM | POA: Diagnosis not present

## 2018-08-16 DIAGNOSIS — K219 Gastro-esophageal reflux disease without esophagitis: Secondary | ICD-10-CM | POA: Diagnosis not present

## 2018-08-19 DIAGNOSIS — L918 Other hypertrophic disorders of the skin: Secondary | ICD-10-CM | POA: Diagnosis not present

## 2018-08-19 DIAGNOSIS — M545 Low back pain: Secondary | ICD-10-CM | POA: Diagnosis not present

## 2018-08-19 DIAGNOSIS — Z0000 Encounter for general adult medical examination without abnormal findings: Secondary | ICD-10-CM | POA: Diagnosis not present

## 2018-08-19 DIAGNOSIS — K219 Gastro-esophageal reflux disease without esophagitis: Secondary | ICD-10-CM | POA: Diagnosis not present

## 2018-08-19 DIAGNOSIS — R197 Diarrhea, unspecified: Secondary | ICD-10-CM | POA: Diagnosis not present

## 2018-08-19 DIAGNOSIS — E1165 Type 2 diabetes mellitus with hyperglycemia: Secondary | ICD-10-CM | POA: Diagnosis not present

## 2018-08-19 DIAGNOSIS — M25551 Pain in right hip: Secondary | ICD-10-CM | POA: Diagnosis not present

## 2018-08-19 DIAGNOSIS — K760 Fatty (change of) liver, not elsewhere classified: Secondary | ICD-10-CM | POA: Diagnosis not present

## 2018-08-26 DIAGNOSIS — N951 Menopausal and female climacteric states: Secondary | ICD-10-CM | POA: Diagnosis not present

## 2018-08-26 DIAGNOSIS — E782 Mixed hyperlipidemia: Secondary | ICD-10-CM | POA: Diagnosis not present

## 2018-08-26 DIAGNOSIS — K760 Fatty (change of) liver, not elsewhere classified: Secondary | ICD-10-CM | POA: Diagnosis not present

## 2018-08-26 DIAGNOSIS — I5032 Chronic diastolic (congestive) heart failure: Secondary | ICD-10-CM | POA: Diagnosis not present

## 2018-08-26 DIAGNOSIS — K219 Gastro-esophageal reflux disease without esophagitis: Secondary | ICD-10-CM | POA: Diagnosis not present

## 2018-08-26 DIAGNOSIS — E1169 Type 2 diabetes mellitus with other specified complication: Secondary | ICD-10-CM | POA: Diagnosis not present

## 2018-08-26 DIAGNOSIS — M25551 Pain in right hip: Secondary | ICD-10-CM | POA: Diagnosis not present

## 2018-08-26 DIAGNOSIS — I10 Essential (primary) hypertension: Secondary | ICD-10-CM | POA: Diagnosis not present

## 2018-08-26 DIAGNOSIS — M545 Low back pain: Secondary | ICD-10-CM | POA: Diagnosis not present

## 2018-09-21 DIAGNOSIS — E782 Mixed hyperlipidemia: Secondary | ICD-10-CM | POA: Diagnosis not present

## 2018-09-21 DIAGNOSIS — E1140 Type 2 diabetes mellitus with diabetic neuropathy, unspecified: Secondary | ICD-10-CM | POA: Diagnosis not present

## 2018-09-21 DIAGNOSIS — G4733 Obstructive sleep apnea (adult) (pediatric): Secondary | ICD-10-CM | POA: Diagnosis not present

## 2018-09-28 ENCOUNTER — Ambulatory Visit: Payer: Medicare HMO | Admitting: Gastroenterology

## 2018-09-28 ENCOUNTER — Other Ambulatory Visit: Payer: Self-pay | Admitting: *Deleted

## 2018-09-28 ENCOUNTER — Encounter: Payer: Self-pay | Admitting: *Deleted

## 2018-09-28 ENCOUNTER — Encounter: Payer: Self-pay | Admitting: Gastroenterology

## 2018-09-28 VITALS — BP 138/61 | HR 51 | Temp 97.1°F | Ht 61.5 in | Wt 271.8 lb

## 2018-09-28 DIAGNOSIS — K529 Noninfective gastroenteritis and colitis, unspecified: Secondary | ICD-10-CM

## 2018-09-28 DIAGNOSIS — R160 Hepatomegaly, not elsewhere classified: Secondary | ICD-10-CM | POA: Diagnosis not present

## 2018-09-28 MED ORDER — PEG 3350-KCL-NA BICARB-NACL 420 G PO SOLR: 4000.0000 mL | Freq: Once | ORAL | 0 refills | Status: AC

## 2018-09-28 NOTE — Assessment & Plan Note (Signed)
History of liver issues dating back to 2000.  No records available at this time.  She describes having either a liver biopsy or part of a liver mass removed at time of cholecystectomy years ago.  She has chronic right upper quadrant pain.  Recent CT imaging as outlined showing hepatomegaly, new or increased calcifications with new pneumobilia possibly evolution of a remote insult-possibly related to prior cholecystectomy.  We are trying to obtain prior records given how old they are it may not be possible.  Further recommendations to follow.

## 2018-09-28 NOTE — Assessment & Plan Note (Signed)
67 year old female reporting chronic diarrhea occurring for years.  She had her gallbladder removed in 2000, she is a diabetic on metformin.  Differential includes bile salt diarrhea, diarrhea related to metformin, celiac disease, microscopic colitis,IBS,  less likely IBD or malignancy.  We will screen for celiac disease.  She did note some improvement in her diarrhea when her metformin dose was reduced couple of months ago.  Offered her colonoscopy with deep sedation in the near future. Consider random colon biopsies.    I have discussed the risks, alternatives, benefits with regards to but not limited to the risk of reaction to medication, bleeding, infection, perforation and the patient is agreeable to proceed. Written consent to be obtained.

## 2018-09-28 NOTE — Progress Notes (Signed)
Primary Care Physician:  Celene Squibb, MD  Primary Gastroenterologist:  Garfield Cornea, MD   Chief Complaint  Patient presents with  . Diarrhea    going on for years    HPI:  Ebony Stevens is a 67 y.o. female here at the request of Dr. Nevada Crane for further evaluation of chronic diarrhea.   Diarrhea for years. Prior to that would have constipation. Diarrhea persistent for more than 10 years. If coughs or sneezes passes stools sometimes. At least 4-5 times of diarrhea per day. In morning more solid stool then gets looser. Sometimes nocturnal bowel movement. Diarrhea a little better in 07/2018 when decreased metformin from 1000mg  to 500mg  bid. Complains of postprandial fecal urgency.  No melena, brbpr. No previous colonoscopy.    Gallbladder surgery in 2000, took some of liver at the same time (?biopsy), then went to [facility].  Stopped going because they were not doing anything. "It was like my liver had cirrhosis but it really didn't". States she had liver mass that only showed up on MRI. Around that time her weight fluctuated from 130 to 180 pounds.  2005 diagnosed with diabetes.    Recently diagnosed with sleep apnea, waiting on her CPAP.   Clinically she has had some heartburn since coming off ranitidine. Manageable with dietary changes. Would like an alternative otc med to take. Advised pepcid AC. Denies melena, brbpr. Has had chronic right upper abd pain and around to the right flank even before her gb was removed. Not necessarily related to meals.    In May she had CT abdomen pelvis with and without contrast which showed moderate hepatic steatosis, hepatomegaly at 20 cm, lateral segment left liver lobe atrophy present on remote 2009 chest CT.  New or increased calcifications with new pneumobilia.  Likely evolution of a remote insult-possibly related to prior cholecystectomy.  Left lower lobe pulmonary nodule, consider noncontrast chest CT in 12 months if patient high risk.  Current Outpatient  Medications  Medication Sig Dispense Refill  . albuterol (PROVENTIL HFA) 108 (90 Base) MCG/ACT inhaler Inhale 2 puffs into the lungs every 6 (six) hours as needed for wheezing or shortness of breath.    Marland Kitchen amLODipine (NORVASC) 5 MG tablet Take 5 mg by mouth daily.    Marland Kitchen atorvastatin (LIPITOR) 80 MG tablet Take 80 mg by mouth daily.  5  . cetirizine (ZYRTEC) 10 MG tablet Take 10 mg by mouth daily.    Marland Kitchen estradiol (ESTRACE) 2 MG tablet Take 2 mg by mouth daily.    . furosemide (LASIX) 40 MG tablet TAKE ONE TABLET BY MOUTH EVERY OTHER DAY (Patient taking differently: Take 40 mg by mouth daily. ) 15 tablet 2  . glipiZIDE (GLUCOTROL) 10 MG tablet Take 10 mg by mouth 2 (two) times daily before a meal.    . lisinopril-hydrochlorothiazide (PRINZIDE,ZESTORETIC) 20-12.5 MG tablet Take 1 tablet by mouth daily.    . metFORMIN (GLUCOPHAGE) 500 MG tablet Take 500 mg by mouth 2 (two) times daily with a meal.    . metoprolol (LOPRESSOR) 100 MG tablet Take 100 mg by mouth daily.    Marland Kitchen OVER THE COUNTER MEDICATION Reli on subcutaneous daily (slow acting). Takes 50 units at bedtime. For fast acting she takes 10 units with meals.    . potassium chloride (K-DUR) 10 MEQ tablet TAKE ONE TABLET BY MOUTH EVERY OTHER DAY (Patient taking differently: Take 10 mEq by mouth daily. ) 15 tablet 2  . polyethylene glycol-electrolytes (NULYTELY/GOLYTELY) 420 g solution Take  4,000 mLs by mouth once for 1 dose. 4000 mL 0   No current facility-administered medications for this visit.     Allergies as of 09/28/2018  . (No Known Allergies)    Past Medical History:  Diagnosis Date  . Asthma   . Chest pain 07/11/2015  . Diabetes mellitus without complication ([place])   . Dyspnea 07/11/2015  . Excessive bleeding in the premenopausal period   . Fibromyalgia   . GERD (gastroesophageal reflux disease)   . HTN (hypertension)   . Hypertension   . Localized edema   . Mixed hyperlipidemia   . Type 2 diabetes mellitus ([place])     Past  Surgical History:  Procedure Laterality Date  . ABDOMINAL HYSTERECTOMY     1975 partial hysterectomy,  1978 "complete hysterctomy and retack bladder". states it didn't take.   . CHOLECYSTECTOMY    . TONSILLECTOMY      Family History  Problem Relation Age of Onset  . Cancer Sister        ovarian  . Diabetes Brother   . Colon cancer Neg Hx     Social History   Socioeconomic History  . Marital status: Married    Spouse name: Not on file  . Number of children: Not on file  . Years of education: Not on file  . Highest education level: Not on file  Occupational History  . Not on file  Social Needs  . Financial resource strain: Not on file  . Food insecurity:    Worry: Not on file    Inability: Not on file  . Transportation needs:    Medical: Not on file    Non-medical: Not on file  Tobacco Use  . Smoking status: Never Smoker  . Smokeless tobacco: Never Used  Substance and Sexual Activity  . Alcohol use: No  . Drug use: No  . Sexual activity: Not Currently  Lifestyle  . Physical activity:    Days per week: Not on file    Minutes per session: Not on file  . Stress: Not on file  Relationships  . Social connections:    Talks on phone: Not on file    Gets together: Not on file    Attends religious service: Not on file    Active member of club or organization: Not on file    Attends meetings of clubs or organizations: Not on file    Relationship status: Not on file  . Intimate partner violence:    Fear of current or ex partner: Not on file    Emotionally abused: Not on file    Physically abused: Not on file    Forced sexual activity: Not on file  Other Topics Concern  . Not on file  Social History Narrative   ** Merged History Encounter **          ROS:  General: Negative for anorexia, weight loss, fever, chills, fatigue, weakness. Eyes: Negative for vision changes.  ENT: Negative for hoarseness, difficulty swallowing , nasal congestion. CV: Negative for  chest pain, angina, palpitations, dyspnea on exertion, peripheral edema.  Respiratory: Negative for dyspnea at rest, dyspnea on exertion, cough, sputum, wheezing.  GI: See history of present illness. GU:  Negative for dysuria, hematuria, urinary incontinence, urinary frequency, nocturnal urination.  MS: Negative for joint pain, low back pain.  Derm: Negative for rash or itching.  Neuro: Negative for weakness, abnormal sensation, seizure, frequent headaches, memory loss, confusion.  Psych: Negative for anxiety, depression, suicidal ideation,  hallucinations.  Endo: Negative for unusual weight change.  Heme: Negative for bruising or bleeding. Allergy: Negative for rash or hives.    Physical Examination:  BP 138/61   Pulse (!) 51   Temp (!) 97.1 F (36.2 C) (Oral)   Ht 5' 1.5" (1.562 m)   Wt 271 lb 12.8 oz (123.3 kg)   BMI 50.52 kg/m    General: Well-nourished, well-developed in no acute distress.  Head: Normocephalic, atraumatic.   Eyes: Conjunctiva pink, no icterus. Mouth: Oropharyngeal mucosa moist and pink , no lesions erythema or exudate. Neck: Supple without thyromegaly, masses, or lymphadenopathy.  Lungs: Clear to auscultation bilaterally.  Heart: Regular rate and rhythm, no murmurs rubs or gallops.  Abdomen: Bowel sounds are normal, nontender, nondistended, no hepatosplenomegaly or masses, no abdominal bruits or    hernia , no rebound or guarding.  Exam limited by body habitus Rectal: not performed Extremities: No lower extremity edema. No clubbing or deformities.  Neuro: Alert and oriented x 4 , grossly normal neurologically.  Skin: Warm and dry, no rash or jaundice.   Psych: Alert and cooperative, normal mood and affect.     Imaging Studies: No results found.

## 2018-09-28 NOTE — Patient Instructions (Signed)
Colonoscopy as scheduled. See separate instructions.   Go to [facility] for labs this week.   I will request copy of old records for review.

## 2018-09-29 ENCOUNTER — Telehealth: Payer: Self-pay | Admitting: *Deleted

## 2018-09-29 LAB — COMPREHENSIVE METABOLIC PANEL
ALBUMIN: 4.2 g/dL (ref 3.6–4.8)
ALT: 22 IU/L (ref 0–32)
AST: 28 IU/L (ref 0–40)
Albumin/Globulin Ratio: 1.7 (ref 1.2–2.2)
Alkaline Phosphatase: 101 IU/L (ref 39–117)
BILIRUBIN TOTAL: 0.4 mg/dL (ref 0.0–1.2)
BUN / CREAT RATIO: 25 (ref 12–28)
BUN: 26 mg/dL (ref 8–27)
CHLORIDE: 99 mmol/L (ref 96–106)
CO2: 26 mmol/L (ref 20–29)
Calcium: 9.4 mg/dL (ref 8.7–10.3)
Creatinine, Ser: 1.03 mg/dL — ABNORMAL HIGH (ref 0.57–1.00)
GFR calc Af Amer: 66 mL/min/{1.73_m2} (ref >59)
GFR calc non Af Amer: 57 mL/min/{1.73_m2} — ABNORMAL LOW (ref >59)
GLOBULIN, TOTAL: 2.5 g/dL (ref 1.5–4.5)
Glucose: 111 mg/dL — ABNORMAL HIGH (ref 65–99)
POTASSIUM: 4.4 mmol/L (ref 3.5–5.2)
SODIUM: 138 mmol/L (ref 134–144)
Total Protein: 6.7 g/dL (ref 6.0–8.5)

## 2018-09-29 LAB — CBC WITH DIFFERENTIAL/PLATELET
BASOS ABS: 0 10*3/uL (ref 0.0–0.2)
Basos: 0 %
EOS (ABSOLUTE): 0.1 10*3/uL (ref 0.0–0.4)
EOS: 2 %
HEMATOCRIT: 39.8 % (ref 34.0–46.6)
Hemoglobin: 13.8 g/dL (ref 11.1–15.9)
IMMATURE GRANULOCYTES: 0 %
Immature Grans (Abs): 0 10*3/uL (ref 0.0–0.1)
LYMPHS ABS: 1.8 10*3/uL (ref 0.7–3.1)
Lymphs: 26 %
MCH: 30.7 pg (ref 26.6–33.0)
MCHC: 34.7 g/dL (ref 31.5–35.7)
MCV: 89 fL (ref 79–97)
MONOS ABS: 0.4 10*3/uL (ref 0.1–0.9)
Monocytes: 6 %
NEUTROS PCT: 66 %
Neutrophils Absolute: 4.4 10*3/uL (ref 1.4–7.0)
PLATELETS: 203 10*3/uL (ref 150–450)
RBC: 4.49 x10E6/uL (ref 3.77–5.28)
RDW: 13 % (ref 12.3–15.4)
WBC: 6.7 10*3/uL (ref 3.4–10.8)

## 2018-09-29 LAB — IGA: IgA/Immunoglobulin A, Serum: 193 mg/dL (ref 87–352)

## 2018-09-29 LAB — TISSUE TRANSGLUTAMINASE, IGA

## 2018-09-29 LAB — PROTIME-INR
INR: 1 (ref 0.8–1.2)
PROTHROMBIN TIME: 10.6 s (ref 9.1–12.0)

## 2018-09-29 NOTE — Progress Notes (Signed)
cc'd to pcp 

## 2018-09-29 NOTE — Telephone Encounter (Signed)
Pre-op schedueld for 11/03/18 at 1:45pm. Letter mailed. Pt aware.

## 2018-10-03 ENCOUNTER — Telehealth: Payer: Self-pay | Admitting: Internal Medicine

## 2018-10-03 NOTE — Progress Notes (Signed)
PT is aware.

## 2018-10-03 NOTE — Telephone Encounter (Signed)
See result note on labs. Pt is aware.

## 2018-10-03 NOTE — Telephone Encounter (Signed)
PATIENT CALLED AND SAID SOMEONE CALLED HER FROM THIS OFFICE.  PLEASE GIVE HER A CALL BACK

## 2018-10-10 DIAGNOSIS — E782 Mixed hyperlipidemia: Secondary | ICD-10-CM | POA: Diagnosis not present

## 2018-10-10 DIAGNOSIS — K219 Gastro-esophageal reflux disease without esophagitis: Secondary | ICD-10-CM | POA: Diagnosis not present

## 2018-10-10 DIAGNOSIS — I10 Essential (primary) hypertension: Secondary | ICD-10-CM | POA: Diagnosis not present

## 2018-10-10 DIAGNOSIS — E1165 Type 2 diabetes mellitus with hyperglycemia: Secondary | ICD-10-CM | POA: Diagnosis not present

## 2018-10-12 DIAGNOSIS — K7460 Unspecified cirrhosis of liver: Secondary | ICD-10-CM

## 2018-10-12 HISTORY — DX: Unspecified cirrhosis of liver: K74.60

## 2018-10-31 NOTE — Patient Instructions (Signed)
ALLEAN MONTFORT  10/31/2018     @PREFPERIOPPHARMACY @   Your procedure is scheduled on  11/10/2018.  Report to [facility] at  700   A.M.  Call this number if you have problems the morning of surgery:  [phone]   Remember:  Follow the diet and prep instructions given to you by Dr Roseanne Kaufman office.                     Take these medicines the morning of surgery with A SIP OF WATER  Amlodipine, zyrtec, lisinopril. Take 1/2 of your usual night time insulin the night before your procedure. DO NOT take any medications for diabetes the morning of your procedure.    Do not wear jewelry, make-up or nail polish.  Do not wear lotions, powders, or perfumes, or deodorant.  Do not shave 48 hours prior to surgery.  Men may shave face and neck.  Do not bring valuables to the hospital.  [facility] is not responsible for any belongings or valuables.  Contacts, dentures or bridgework may not be worn into surgery.  Leave your suitcase in the car.  After surgery it may be brought to your room.  For patients admitted to the hospital, discharge time will be determined by your treatment team.  Patients discharged the day of surgery will not be allowed to drive home.   Name and phone number of your driver:   family Special instructions:  None  Please read over the following fact sheets that you were given. Anesthesia Post-op Instructions and Care and Recovery After Surgery       Colonoscopy, Adult A colonoscopy is an exam to look at the large intestine. It is done to check for problems, such as:  Lumps (tumors).  Growths (polyps).  Swelling (inflammation).  Bleeding. What happens before the procedure? Eating and drinking Follow instructions from your doctor about eating and drinking. These instructions may include:  A few days before the procedure - follow a low-fiber diet. ? Avoid nuts. ? Avoid seeds. ? Avoid dried fruit. ? Avoid raw fruits. ? Avoid  vegetables.  1-3 days before the procedure - follow a clear liquid diet. Avoid liquids that have red or purple dye. Drink only clear liquids, such as: ? Clear broth or bouillon. ? Black coffee or tea. ? Clear juice. ? Clear soft drinks or sports drinks. ? Gelatin dessert. ? Popsicles.  On the day of the procedure - do not eat or drink anything during the 2 hours before the procedure. Up to 2 hours before the procedure, you may continue to drink clear liquids, such as water or clear fruit juice.  Bowel prep If you were prescribed an oral bowel prep:  Take it as told by your doctor. Starting the day before your procedure, you will need to drink a lot of liquid. The liquid will cause you to poop (have bowel movements) until your poop is almost clear or light green.  To clean out your colon, you may also be given: ? Laxative medicines. ? Instructions about how to use an enema.  If your skin or butt gets irritated from diarrhea, you may: ? Wipe the area with wipes that have medicine in them, such as adult wet wipes with aloe and vitamin E. ? Put something on your skin that soothes the area, such as petroleum jelly.  If you throw up (vomit) while drinking the bowel prep, take  a break for up to 60 minutes. Then begin the bowel prep again. If you keep throwing up and you cannot take the bowel prep without throwing up, call your doctor. General instructions  Ask your doctor about: ? Changing or stopping your normal medicines. This is important if you take iron pills, diabetes medicines, or blood thinners. ? Taking medicines such as aspirin and ibuprofen. These medicines can thin your blood. Do not take these medicines unless your doctor tells you to take them.  Plan to have someone take you home from the hospital or clinic. What happens during the procedure?   An IV tube may be put into one of your veins.  You will be given medicine to help you relax (sedative).  To reduce your risk of  infection: ? Your doctors will wash their hands. ? Your anal area will be washed with soap.  You will be asked to lie on your side with your knees bent.  Your doctor will get a long, thin, flexible tube ready. The tube will have a camera and a light on the end.  The tube will be put into your anus.  The tube will be gently put into your large intestine.  Air will be delivered into your large intestine to keep it open. You may feel some pressure or cramping.  The camera will be used to take photos.  A small tissue sample may be removed for testing (biopsy).  If small growths are found, your doctor may remove them and have them checked for cancer.  The tube that was put into your anus will be slowly removed. The procedure may vary among doctors and hospitals. What happens after the procedure?  Your doctor will check on you often until the medicines you were given have worn off.  Do not drive for 24 hours after the procedure.  You may have a small amount of blood in your poop.  You may pass gas.  You may have mild cramps or bloating in your belly (abdomen).  It is up to you to get the results of your procedure. Ask your doctor, or the department performing the procedure, when your results will be ready. Summary  A colonoscopy is an exam to look at the large intestine.  Follow instructions from your doctor about eating and drinking before the procedure.  If you were prescribed an oral bowel prep to clean out your colon, take it as told by your doctor.  Your doctor will check on you often until the medicines you were given have worn off.  Plan to have someone take you home from the hospital or clinic. This information is not intended to replace advice given to you by your health care provider. Make sure you discuss any questions you have with your health care provider. Document Released: 10/31/2010 Document Revised: 07/28/2017 Document Reviewed: 12/10/2015 Elsevier  Interactive Patient Education  2019 [employer]  Colonoscopy, Adult, Care After This sheet gives you information about how to care for yourself after your procedure. Your health care provider may also give you more specific instructions. If you have problems or questions, contact your health care provider. What can I expect after the procedure? After the procedure, it is common to have:  A small amount of blood in your stool for 24 hours after the procedure.  Some gas.  Mild abdominal cramping or bloating. Follow these instructions at home: General instructions  For the first 24 hours after the procedure: ? Do not drive or  use machinery. ? Do not sign important documents. ? Do not drink alcohol. ? Do your regular daily activities at a slower pace than normal. ? Eat soft, easy-to-digest foods.  Take over-the-counter or prescription medicines only as told by your health care provider. Relieving cramping and bloating   Try walking around when you have cramps or feel bloated.  Apply heat to your abdomen as told by your health care provider. Use a heat source that your health care provider recommends, such as a moist heat pack or a heating pad. ? Place a towel between your skin and the heat source. ? Leave the heat on for 20-30 minutes. ? Remove the heat if your skin turns bright red. This is especially important if you are unable to feel pain, heat, or cold. You may have a greater risk of getting burned. Eating and drinking   Drink enough fluid to keep your urine pale yellow.  Resume your normal diet as instructed by your health care provider. Avoid heavy or fried foods that are hard to digest.  Avoid drinking alcohol for as long as instructed by your health care provider. Contact a health care provider if:  You have blood in your stool 2-3 days after the procedure. Get help right away if:  You have more than a small spotting of blood in your stool.  You pass large blood  clots in your stool.  Your abdomen is swollen.  You have nausea or vomiting.  You have a fever.  You have increasing abdominal pain that is not relieved with medicine. Summary  After the procedure, it is common to have a small amount of blood in your stool. You may also have mild abdominal cramping and bloating.  For the first 24 hours after the procedure, do not drive or use machinery, sign important documents, or drink alcohol.  Contact your health care provider if you have a lot of blood in your stool, nausea or vomiting, a fever, or increased abdominal pain. This information is not intended to replace advice given to you by your health care provider. Make sure you discuss any questions you have with your health care provider. Document Released: 05/12/2004 Document Revised: 07/21/2017 Document Reviewed: 12/10/2015 Elsevier Interactive Patient Education  2019 [place] Anesthesia is a term that refers to techniques, procedures, and medicines that help a person stay safe and comfortable during a medical procedure. Monitored anesthesia care, or sedation, is one type of anesthesia. Your anesthesia specialist may recommend sedation if you will be having a procedure that does not require you to be unconscious, such as:  Cataract surgery.  A dental procedure.  A biopsy.  A colonoscopy. During the procedure, you may receive a medicine to help you relax (sedative). There are three levels of sedation:  Mild sedation. At this level, you may feel awake and relaxed. You will be able to follow directions.  Moderate sedation. At this level, you will be sleepy. You may not remember the procedure.  Deep sedation. At this level, you will be asleep. You will not remember the procedure. The more medicine you are given, the deeper your level of sedation will be. Depending on how you respond to the procedure, the anesthesia specialist may change your level of  sedation or the type of anesthesia to fit your needs. An anesthesia specialist will monitor you closely during the procedure. Let your health care provider know about:  Any allergies you have.  All medicines you are taking,  including vitamins, herbs, eye drops, creams, and over-the-counter medicines.  Any use of steroids (by mouth or as a cream).  Any problems you or family members have had with sedatives and anesthetic medicines.  Any blood disorders you have.  Any surgeries you have had.  Any medical conditions you have, such as sleep apnea.  Whether you are pregnant or may be pregnant.  Any use of cigarettes, alcohol, or street drugs. What are the risks? Generally, this is a safe procedure. However, problems may occur, including:  Getting too much medicine (oversedation).  Nausea.  Allergic reaction to medicines.  Trouble breathing. If this happens, a breathing tube may be used to help with breathing. It will be removed when you are awake and breathing on your own.  Heart trouble.  Lung trouble. Before the procedure Staying hydrated Follow instructions from your health care provider about hydration, which may include:  Up to 2 hours before the procedure - you may continue to drink clear liquids, such as water, clear fruit juice, black coffee, and plain tea. Eating and drinking restrictions Follow instructions from your health care provider about eating and drinking, which may include:  8 hours before the procedure - stop eating heavy meals or foods such as meat, fried foods, or fatty foods.  6 hours before the procedure - stop eating light meals or foods, such as toast or cereal.  6 hours before the procedure - stop drinking milk or drinks that contain milk.  2 hours before the procedure - stop drinking clear liquids. Medicines Ask your health care provider about:  Changing or stopping your regular medicines. This is especially important if you are taking  diabetes medicines or blood thinners.  Taking medicines such as aspirin and ibuprofen. These medicines can thin your blood. Do not take these medicines before your procedure if your health care provider instructs you not to. Tests and exams  You will have a physical exam.  You may have blood tests done to show: ? How well your kidneys and liver are working. ? How well your blood can clot. General instructions  Plan to have someone take you home from the hospital or clinic.  If you will be going home right after the procedure, plan to have someone with you for 24 hours.  What happens during the procedure?  Your blood pressure, heart rate, breathing, level of pain and overall condition will be monitored.  An IV tube will be inserted into one of your veins.  Your anesthesia specialist will give you medicines as needed to keep you comfortable during the procedure. This may mean changing the level of sedation.  The procedure will be performed. After the procedure  Your blood pressure, heart rate, breathing rate, and blood oxygen level will be monitored until the medicines you were given have worn off.  Do not drive for 24 hours if you received a sedative.  You may: ? Feel sleepy, clumsy, or nauseous. ? Feel forgetful about what happened after the procedure. ? Have a sore throat if you had a breathing tube during the procedure. ? Vomit. This information is not intended to replace advice given to you by your health care provider. Make sure you discuss any questions you have with your health care provider. Document Released: 06/24/2005 Document Revised: 03/06/2016 Document Reviewed: 01/19/2016 Elsevier Interactive Patient Education  2019 [place], Care After These instructions provide you with information about caring for yourself after your procedure. Your health care provider may  also give you more specific instructions. Your treatment has been  planned according to current medical practices, but problems sometimes occur. Call your health care provider if you have any problems or questions after your procedure. What can I expect after the procedure? After your procedure, you may:  Feel sleepy for several hours.  Feel clumsy and have poor balance for several hours.  Feel forgetful about what happened after the procedure.  Have poor judgment for several hours.  Feel nauseous or vomit.  Have a sore throat if you had a breathing tube during the procedure. Follow these instructions at home: For at least 24 hours after the procedure:      Have a responsible adult stay with you. It is important to have someone help care for you until you are awake and alert.  Rest as needed.  Do not: ? Participate in activities in which you could fall or become injured. ? Drive. ? Use heavy machinery. ? Drink alcohol. ? Take sleeping pills or medicines that cause drowsiness. ? Make important decisions or sign legal documents. ? Take care of children on your own. Eating and drinking  Follow the diet that is recommended by your health care provider.  If you vomit, drink water, juice, or soup when you can drink without vomiting.  Make sure you have little or no nausea before eating solid foods. General instructions  Take over-the-counter and prescription medicines only as told by your health care provider.  If you have sleep apnea, surgery and certain medicines can increase your risk for breathing problems. Follow instructions from your health care provider about wearing your sleep device: ? Anytime you are sleeping, including during daytime naps. ? While taking prescription pain medicines, sleeping medicines, or medicines that make you drowsy.  If you smoke, do not smoke without supervision.  Keep all follow-up visits as told by your health care provider. This is important. Contact a health care provider if:  You keep feeling  nauseous or you keep vomiting.  You feel light-headed.  You develop a rash.  You have a fever. Get help right away if:  You have trouble breathing. Summary  For several hours after your procedure, you may feel sleepy and have poor judgment.  Have a responsible adult stay with you for at least 24 hours or until you are awake and alert. This information is not intended to replace advice given to you by your health care provider. Make sure you discuss any questions you have with your health care provider. Document Released: 01/19/2016 Document Revised: 05/14/2017 Document Reviewed: 01/19/2016 Elsevier Interactive Patient Education  2019 [employer].

## 2018-11-03 ENCOUNTER — Encounter (HOSPITAL_COMMUNITY): Payer: Self-pay

## 2018-11-03 ENCOUNTER — Ambulatory Visit (HOSPITAL_COMMUNITY)
Admission: RE | Admit: 2018-11-03 | Discharge: 2018-11-03 | Disposition: A | Discharge status: Home / Self Care | Payer: Medicare HMO | Source: Ambulatory Visit | Attending: Internal Medicine | Admitting: Internal Medicine

## 2018-11-03 ENCOUNTER — Other Ambulatory Visit: Payer: Self-pay

## 2018-11-03 DIAGNOSIS — Z01818 Encounter for other preprocedural examination: Secondary | ICD-10-CM | POA: Insufficient documentation

## 2018-11-03 LAB — BASIC METABOLIC PANEL
Anion gap: 11 (ref 5–15)
BUN: 17 mg/dL (ref 8–23)
CO2: 23 mmol/L (ref 22–32)
Calcium: 8.5 mg/dL — ABNORMAL LOW (ref 8.9–10.3)
Chloride: 101 mmol/L (ref 98–111)
Creatinine, Ser: 0.99 mg/dL (ref 0.44–1.00)
GFR calc Af Amer: >60 mL/min (ref >60)
GFR calc non Af Amer: 60 mL/min — ABNORMAL LOW (ref >60)
GLUCOSE: 161 mg/dL — AB (ref 70–99)
POTASSIUM: 3.8 mmol/L (ref 3.5–5.1)
Sodium: 135 mmol/L (ref 135–145)

## 2018-11-10 ENCOUNTER — Encounter (HOSPITAL_COMMUNITY): Payer: Self-pay | Admitting: *Deleted

## 2018-11-10 ENCOUNTER — Ambulatory Visit (HOSPITAL_COMMUNITY): Payer: Medicare HMO | Admitting: Anesthesiology

## 2018-11-10 ENCOUNTER — Ambulatory Visit (HOSPITAL_COMMUNITY)
Admission: RE | Admit: 2018-11-10 | Discharge: 2018-11-10 | Disposition: A | Discharge status: Home / Self Care | Payer: Medicare HMO | Attending: Internal Medicine | Admitting: Internal Medicine

## 2018-11-10 ENCOUNTER — Encounter (HOSPITAL_COMMUNITY)
Admission: RE | Disposition: A | Discharge status: Home / Self Care | Payer: Self-pay | Source: Home / Self Care | Attending: Internal Medicine

## 2018-11-10 DIAGNOSIS — J45909 Unspecified asthma, uncomplicated: Secondary | ICD-10-CM | POA: Diagnosis not present

## 2018-11-10 DIAGNOSIS — F419 Anxiety disorder, unspecified: Secondary | ICD-10-CM | POA: Diagnosis not present

## 2018-11-10 DIAGNOSIS — Z79899 Other long term (current) drug therapy: Secondary | ICD-10-CM | POA: Diagnosis not present

## 2018-11-10 DIAGNOSIS — D124 Benign neoplasm of descending colon: Secondary | ICD-10-CM

## 2018-11-10 DIAGNOSIS — K5730 Diverticulosis of large intestine without perforation or abscess without bleeding: Secondary | ICD-10-CM

## 2018-11-10 DIAGNOSIS — M797 Fibromyalgia: Secondary | ICD-10-CM | POA: Insufficient documentation

## 2018-11-10 DIAGNOSIS — Z7982 Long term (current) use of aspirin: Secondary | ICD-10-CM | POA: Diagnosis not present

## 2018-11-10 DIAGNOSIS — E782 Mixed hyperlipidemia: Secondary | ICD-10-CM | POA: Insufficient documentation

## 2018-11-10 DIAGNOSIS — I509 Heart failure, unspecified: Secondary | ICD-10-CM | POA: Insufficient documentation

## 2018-11-10 DIAGNOSIS — Z791 Long term (current) use of non-steroidal anti-inflammatories (NSAID): Secondary | ICD-10-CM | POA: Diagnosis not present

## 2018-11-10 DIAGNOSIS — F329 Major depressive disorder, single episode, unspecified: Secondary | ICD-10-CM | POA: Diagnosis not present

## 2018-11-10 DIAGNOSIS — K635 Polyp of colon: Secondary | ICD-10-CM | POA: Diagnosis not present

## 2018-11-10 DIAGNOSIS — E119 Type 2 diabetes mellitus without complications: Secondary | ICD-10-CM | POA: Insufficient documentation

## 2018-11-10 DIAGNOSIS — Z794 Long term (current) use of insulin: Secondary | ICD-10-CM | POA: Diagnosis not present

## 2018-11-10 DIAGNOSIS — K529 Noninfective gastroenteritis and colitis, unspecified: Secondary | ICD-10-CM | POA: Diagnosis not present

## 2018-11-10 DIAGNOSIS — K219 Gastro-esophageal reflux disease without esophagitis: Secondary | ICD-10-CM | POA: Insufficient documentation

## 2018-11-10 DIAGNOSIS — I110 Hypertensive heart disease with heart failure: Secondary | ICD-10-CM | POA: Insufficient documentation

## 2018-11-10 DIAGNOSIS — I10 Essential (primary) hypertension: Secondary | ICD-10-CM | POA: Diagnosis not present

## 2018-11-10 HISTORY — PX: POLYPECTOMY: SHX5525

## 2018-11-10 HISTORY — PX: COLONOSCOPY WITH PROPOFOL: SHX5780

## 2018-11-10 HISTORY — PX: BIOPSY: SHX5522

## 2018-11-10 LAB — GLUCOSE, CAPILLARY
Glucose-Capillary: 198 mg/dL — ABNORMAL HIGH (ref 70–99)
Glucose-Capillary: 163 mg/dL — ABNORMAL HIGH (ref 70–99)

## 2018-11-10 SURGERY — COLONOSCOPY WITH PROPOFOL
Anesthesia: Monitor Anesthesia Care

## 2018-11-10 MED ORDER — HYDROCODONE-ACETAMINOPHEN 7.5-325 MG PO TABS: 1.0000 | ORAL_TABLET | Freq: Once | ORAL | Status: DC | PRN

## 2018-11-10 MED ORDER — PROPOFOL 10 MG/ML IV BOLUS
INTRAVENOUS | Status: DC | PRN
  Administered 2018-11-10: 20 mg via INTRAVENOUS

## 2018-11-10 MED ORDER — MEPERIDINE HCL 100 MG/ML IJ SOLN: 6.2500 mg | INTRAMUSCULAR | Status: DC | PRN

## 2018-11-10 MED ORDER — PROMETHAZINE HCL 25 MG/ML IJ SOLN: 6.2500 mg | INTRAMUSCULAR | Status: DC | PRN

## 2018-11-10 MED ORDER — HYDROMORPHONE HCL 1 MG/ML IJ SOLN: 0.2500 mg | INTRAMUSCULAR | Status: DC | PRN

## 2018-11-10 MED ORDER — LACTATED RINGERS IV SOLN: INTRAVENOUS | Status: DC

## 2018-11-10 MED ORDER — PROPOFOL 500 MG/50ML IV EMUL
INTRAVENOUS | Status: DC | PRN
  Administered 2018-11-10: 08:00:00 via INTRAVENOUS
  Administered 2018-11-10: 150 ug/kg/min via INTRAVENOUS

## 2018-11-10 MED ORDER — CHLORHEXIDINE GLUCONATE CLOTH 2 % EX PADS: 6.0000 | MEDICATED_PAD | Freq: Once | CUTANEOUS | Status: DC

## 2018-11-10 MED ORDER — LACTATED RINGERS IV SOLN
INTRAVENOUS | Status: DC
  Administered 2018-11-10: 1000 mL via INTRAVENOUS

## 2018-11-10 NOTE — Transfer of Care (Signed)
Immediate Anesthesia Transfer of Care Note  Patient: Ebony Stevens  Procedure(s) Performed: COLONOSCOPY WITH PROPOFOL (N/A ) BIOPSY POLYPECTOMY  Patient Location: PACU  Anesthesia Type:MAC  Level of Consciousness: awake, alert , oriented and patient cooperative  Airway & Oxygen Therapy: Patient Spontanous Breathing  Post-op Assessment: Report given to RN and Post -op Vital signs reviewed and stable  Post vital signs: Reviewed and stable  Last Vitals:  Vitals Value Taken Time  BP    Temp    Pulse    Resp    SpO2      Last Pain:  Vitals:   11/10/18 0759  TempSrc:   PainSc: 0-No pain      Patients Stated Pain Goal: 6 (36/06/77 0340)  Complications: No apparent anesthesia complications

## 2018-11-10 NOTE — Anesthesia Procedure Notes (Signed)
Procedure Name: [place] Performed by: Andree Elk Jaree Trinka A, CRNA Pre-anesthesia Checklist: Patient identified, Emergency Drugs available, Suction available, Timeout performed and Patient being monitored Patient Re-evaluated:Patient Re-evaluated prior to induction Oxygen Delivery Method: Non-rebreather mask

## 2018-11-10 NOTE — H&P (Signed)
@LOGO @   Primary Care Physician:  Celene Squibb, MD Primary Gastroenterologist:  Dr. Gala Romney  Pre-Procedure History & Physical: HPI:  Ebony Stevens is a 68 y.o. female here for first ever colonoscopy.  Has chronic diarrhea that is not been evaluated.  Past Medical History:  Diagnosis Date  . Asthma   . Chest pain 07/11/2015  . Diabetes mellitus without complication ([place])   . Dyspnea 07/11/2015  . Excessive bleeding in the premenopausal period   . Fibromyalgia   . GERD (gastroesophageal reflux disease)   . HTN (hypertension)   . Hypertension   . Localized edema   . Mixed hyperlipidemia   . Type 2 diabetes mellitus ([place])     Past Surgical History:  Procedure Laterality Date  . ABDOMINAL HYSTERECTOMY     1975 partial hysterectomy,  1978 "complete hysterctomy and retack bladder". states it didn't take.   . CHOLECYSTECTOMY    . TONSILLECTOMY      Prior to Admission medications   Medication Sig Start Date End Date Taking? Authorizing Provider  amLODipine (NORVASC) 5 MG tablet Take 5 mg by mouth daily.   Yes [provider]  aspirin EC 325 MG tablet Take 325 mg by mouth daily.   Yes [provider]  atorvastatin (LIPITOR) 80 MG tablet Take 80 mg by mouth daily. 08/01/18  Yes [provider]  cetirizine (ZYRTEC) 10 MG tablet Take 10 mg by mouth daily.   Yes [provider]  estradiol (ESTRACE) 2 MG tablet Take 2 mg by mouth daily.   Yes [provider]  furosemide (LASIX) 40 MG tablet TAKE ONE TABLET BY MOUTH EVERY OTHER DAY Patient taking differently: Take 40 mg by mouth daily.  05/28/17  Yes Fay Records, MD  glipiZIDE (GLUCOTROL) 10 MG tablet Take 10 mg by mouth 2 (two) times daily before a meal.   Yes [provider]  insulin NPH Human (HUMULIN N,NOVOLIN N) 100 UNIT/ML injection Inject 10 Units into the skin 3 (three) times daily. Before Meals   Yes [provider]  insulin NPH-regular Human (NOVOLIN 70/30 RELION)  (70-30) 100 UNIT/ML injection Inject 50 Units into the skin at bedtime.   Yes [provider]  lisinopril-hydrochlorothiazide (PRINZIDE,ZESTORETIC) 20-12.5 MG tablet Take 1 tablet by mouth 2 (two) times daily.    Yes [provider]  metFORMIN (GLUCOPHAGE-XR) 500 MG 24 hr tablet Take 500 mg by mouth 2 (two) times daily.   Yes [provider]  metoprolol succinate (TOPROL-XL) 100 MG 24 hr tablet Take 100 mg by mouth daily. Take with or immediately following a meal.   Yes [provider]  naproxen (NAPROSYN) 500 MG tablet Take 500 mg by mouth every morning.   Yes [provider]  potassium chloride (K-DUR) 10 MEQ tablet TAKE ONE TABLET BY MOUTH EVERY OTHER DAY Patient taking differently: Take 10 mEq by mouth daily.  05/28/17  Yes Fay Records, MD    Allergies as of 09/28/2018  . (No Known Allergies)    Family History  Problem Relation Age of Onset  . Cancer Sister        ovarian  . Diabetes Brother   . Colon cancer Neg Hx     Social History   Socioeconomic History  . Marital status: Married    Spouse name: Not on file  . Number of children: Not on file  . Years of education: Not on file  . Highest education level: Not on file  Occupational History  .  Not on file  Social Needs  . Financial resource strain: Not on file  . Food insecurity:    Worry: Not on file    Inability: Not on file  . Transportation needs:    Medical: Not on file    Non-medical: Not on file  Tobacco Use  . Smoking status: Never Smoker  . Smokeless tobacco: Never Used  Substance and Sexual Activity  . Alcohol use: No  . Drug use: No  . Sexual activity: Not Currently  Lifestyle  . Physical activity:    Days per week: Not on file    Minutes per session: Not on file  . Stress: Not on file  Relationships  . Social connections:    Talks on phone: Not on file    Gets together: Not on file    Attends religious service: Not on file    Active member of club or  organization: Not on file    Attends meetings of clubs or organizations: Not on file    Relationship status: Not on file  . Intimate partner violence:    Fear of current or ex partner: Not on file    Emotionally abused: Not on file    Physically abused: Not on file    Forced sexual activity: Not on file  Other Topics Concern  . Not on file  Social History Narrative   ** Merged History Encounter **        Review of Systems: See HPI, otherwise negative ROS  Physical Exam: BP (!) 134/45   Temp 98.1 F (36.7 C) (Oral)   Resp 20   SpO2 96%  General:   Alert,  Well-developed, well-nourished, pleasant and cooperative in NAD Nose:  No deformity, discharge,  or lesions. Mouth:  No deformity or lesions. Neck:  Supple; no masses or thyromegaly. No significant cervical adenopathy. Lungs:  Clear throughout to auscultation.   No wheezes, crackles, or rhonchi. No acute distress. Heart:  Regular rate and rhythm; no murmurs, clicks, rubs,  or gallops. Abdomen: Non-distended, normal bowel sounds.  Soft and nontender without appreciable mass or hepatosplenomegaly.  Pulses:  Normal pulses noted. Extremities:  Without clubbing or edema.  Impression/Plan: Patient here for first ever colonoscopy.  Chronic diarrhea needs further evaluation.  The risks, benefits, limitations, alternatives and imponderables have been reviewed with the patient. Questions have been answered. All parties are agreeable.      Notice: This dictation was prepared with Dragon dictation along with smaller phrase technology. Any transcriptional errors that result from this process are unintentional and may not be corrected upon review.

## 2018-11-10 NOTE — Anesthesia Postprocedure Evaluation (Signed)
Anesthesia Post Note  Patient: Ebony Stevens  Procedure(s) Performed: COLONOSCOPY WITH PROPOFOL (N/A ) BIOPSY POLYPECTOMY  Patient location during evaluation: Short Stay Anesthesia Type: MAC Level of consciousness: awake and alert and patient cooperative Vital Signs Assessment: post-procedure vital signs reviewed and stable Respiratory status: spontaneous breathing Cardiovascular status: stable Postop Assessment: no apparent nausea or vomiting Anesthetic complications: no     Last Vitals:  Vitals:   11/10/18 0825 11/10/18 0830  BP: (!) 118/43 (!) 121/46  Resp: 18 18  Temp: 36.6 C   SpO2: 95% 98%    Last Pain:  Vitals:   11/10/18 0858  TempSrc:   PainSc: 0-No pain                 Kalesha Irving

## 2018-11-10 NOTE — Anesthesia Preprocedure Evaluation (Signed)
Anesthesia Evaluation    Airway Mallampati: II       Dental  (+) Teeth Intact   Pulmonary shortness of breath and with exertion, asthma ,     + decreased breath sounds      Cardiovascular hypertension, +CHF   Rhythm:regular     Neuro/Psych Anxiety Depression  Neuromuscular disease    GI/Hepatic GERD  ,  Endo/Other  diabetes  Renal/GU      Musculoskeletal   Abdominal   Peds  Hematology   Anesthesia Other Findings Morbid obesity Physically deconditioned 12 lead with SB mid 30s  Reproductive/Obstetrics                             Anesthesia Physical Anesthesia Plan  ASA: IV  Anesthesia Plan: MAC   Post-op Pain Management:    Induction:   PONV Risk Score and Plan:   Airway Management Planned:   Additional Equipment:   Intra-op Plan:   Post-operative Plan:   Informed Consent: I have reviewed the patients History and Physical, chart, labs and discussed the procedure including the risks, benefits and alternatives for the proposed anesthesia with the patient or authorized representative who has indicated his/her understanding and acceptance.       Plan Discussed with: Anesthesiologist  Anesthesia Plan Comments:         Anesthesia Quick Evaluation

## 2018-11-10 NOTE — Op Note (Signed)
[facility] Patient Name: Ebony Stevens Procedure Date: 11/10/2018 7:54 AM MRN: [id number] Date of Birth: 1950-10-18 Attending MD: Norvel Richards , MD CSN: [id number] Age: 68 Admit Type: Outpatient Procedure:                Colonoscopy Indications:              Chronic diarrhea Providers:                Norvel Richards, MD, Jeanann Lewandowsky. Sharon Seller, RN,                            Gerome Sam, RN, Aram Candela Referring MD:              Medicines:                Propofol per Anesthesia Complications:            No immediate complications. Estimated Blood Loss:     Estimated blood loss was minimal. Procedure:                Pre-Anesthesia Assessment:                           - Prior to the procedure, a History and Physical                            was performed, and patient medications and                            allergies were reviewed. The patient's tolerance of                            previous anesthesia was also reviewed. The risks                            and benefits of the procedure and the sedation                            options and risks were discussed with the patient.                            All questions were answered, and informed consent                            was obtained. Prior Anticoagulants: The patient has                            taken no previous anticoagulant or antiplatelet                            agents. ASA Grade Assessment: II - A patient with                            mild systemic disease. After reviewing the risks  and benefits, the patient was deemed in                            satisfactory condition to undergo the procedure.                           After obtaining informed consent, the colonoscope                            was passed under direct vision. Throughout the                            procedure, the patient's blood pressure, pulse, and                            oxygen  saturations were monitored continuously. The                            CF-[id number] ([id number]) scope was introduced through                            the and advanced to the 5 cm into the ileum. The                            colonoscopy was performed without difficulty. The                            patient tolerated the procedure well. The quality                            of the bowel preparation was adequate. The entire                            colon was well visualized. The terminal ileum,                            ileocecal valve, appendiceal orifice, and rectum                            were photographed. Scope In: 8:07:54 AM Scope Out: 8:20:19 AM Scope Withdrawal Time: 0 hours 10 minutes 35 seconds  Total Procedure Duration: 0 hours 12 minutes 25 seconds  Findings:      The perianal and digital rectal examinations were normal.      Scattered medium-mouthed diverticula were found in the sigmoid colon and       descending colon.      A 5 mm polyp was found in the descending colon. The polyp was       semi-pedunculated. The polyp was removed with a cold snare. Resection       and retrieval were complete. Estimated blood loss was minimal.      The exam was otherwise without abnormality on direct and retroflexion       views. Distal 5 cm of TI appeared normal. Impression:               -  Diverticulosis in the sigmoid colon and in the                            descending colon.                           - One 5 mm polyp in the descending colon, removed                            with a cold snare. Resected and retrieved. Status                            post segmental biopsy.                           - The examination was otherwise normal on direct                            and retroflexion views. Moderate Sedation:      Moderate (conscious) sedation was personally administered by an       anesthesia professional. The following parameters were monitored: oxygen        saturation, heart rate, blood pressure, respiratory rate, EKG, adequacy       of pulmonary ventilation, and response to care. Recommendation:            Procedure Code(s):        --- Professional ---                           [phone], Colonoscopy, flexible; with removal of                            tumor(s), polyp(s), or other lesion(s) by snare                            technique Diagnosis Code(s):        --- Professional ---                           D12.4, Benign neoplasm of descending colon                           K52.9, Noninfective gastroenteritis and colitis,                            unspecified                           K57.30, Diverticulosis of large intestine without                            perforation or abscess without bleeding CPT copyright 2018 American Medical Association. All rights reserved. The codes documented in this report are preliminary and upon coder review may  be revised to meet current compliance requirements. Cristopher Estimable. Rourk, MD Norvel Richards, MD 11/10/2018 8:28:33 AM This report has been signed electronically. Number of Addenda: 0

## 2018-11-10 NOTE — Discharge Instructions (Signed)
Colonoscopy Discharge Instructions  Read the instructions outlined below and refer to this sheet in the next few weeks. These discharge instructions provide you with general information on caring for yourself after you leave the hospital. Your doctor may also give you specific instructions. While your treatment has been planned according to the most current medical practices available, unavoidable complications occasionally occur. If you have any problems or questions after discharge, call Dr. Gala Romney at [phone]. ACTIVITY  You may resume your regular activity, but move at a slower pace for the next 24 hours.   Take frequent rest periods for the next 24 hours.   Walking will help get rid of the air and reduce the bloated feeling in your belly (abdomen).   No driving for 24 hours (because of the medicine (anesthesia) used during the test).    Do not sign any important legal documents or operate any machinery for 24 hours (because of the anesthesia used during the test).  NUTRITION  Drink plenty of fluids.   You may resume your normal diet as instructed by your doctor.   Begin with a light meal and progress to your normal diet. Heavy or fried foods are harder to digest and may make you feel sick to your stomach (nauseated).   Avoid alcoholic beverages for 24 hours or as instructed.  MEDICATIONS  You may resume your normal medications unless your doctor tells you otherwise.  WHAT YOU CAN EXPECT TODAY  Some feelings of bloating in the abdomen.   Passage of more gas than usual.   Spotting of blood in your stool or on the toilet paper.  IF YOU HAD POLYPS REMOVED DURING THE COLONOSCOPY:  No aspirin products for 7 days or as instructed.   No alcohol for 7 days or as instructed.   Eat a soft diet for the next 24 hours.  FINDING OUT THE RESULTS OF YOUR TEST Not all test results are available during your visit. If your test results are not back during the visit, make an appointment  with your caregiver to find out the results. Do not assume everything is normal if you have not heard from your caregiver or the medical facility. It is important for you to follow up on all of your test results.  SEEK IMMEDIATE MEDICAL ATTENTION IF:  You have more than a spotting of blood in your stool.   Your belly is swollen (abdominal distention).   You are nauseated or vomiting.   You have a temperature over 101.   You have abdominal pain or discomfort that is severe or gets worse throughout the day.   Diverticulosis and colon polyp information provided  Further recommendations to follow pending review of pathology report     Diverticulosis  Diverticulosis is a condition that develops when small pouches (diverticula) form in the wall of the large intestine (colon). The colon is where water is absorbed and stool is formed. The pouches form when the inside layer of the colon pushes through weak spots in the outer layers of the colon. You may have a few pouches or many of them. What are the causes? The cause of this condition is not known. What increases the risk? The following factors may make you more likely to develop this condition:  Being older than age 71. Your risk for this condition increases with age. Diverticulosis is rare among people younger than age 28. By age 53, many people have it.  Eating a low-fiber diet.  Having frequent constipation.  Being overweight.  Not getting enough exercise.  Smoking.  Taking over-the-counter pain medicines, like aspirin and ibuprofen.  Having a family history of diverticulosis. What are the signs or symptoms? In most people, there are no symptoms of this condition. If you do have symptoms, they may include:  Bloating.  Cramps in the abdomen.  Constipation or diarrhea.  Pain in the lower left side of the abdomen. How is this diagnosed? This condition is most often diagnosed during an exam for other colon problems.  Because diverticulosis usually has no symptoms, it often cannot be diagnosed independently. This condition may be diagnosed by:  Using a flexible scope to examine the colon (colonoscopy).  Taking an X-ray of the colon after dye has been put into the colon (barium enema).  Doing a CT scan. How is this treated? You may not need treatment for this condition if you have never developed an infection related to diverticulosis. If you have had an infection before, treatment may include:  Eating a high-fiber diet. This may include eating more fruits, vegetables, and grains.  Taking a fiber supplement.  Taking a live bacteria supplement (probiotic).  Taking medicine to relax your colon.  Taking antibiotic medicines. Follow these instructions at home:  Drink 6-8 glasses of water or more each day to prevent constipation.  Try not to strain when you have a bowel movement.  If you have had an infection before: ? Eat more fiber as directed by your health care provider or your diet and nutrition specialist (dietitian). ? Take a fiber supplement or probiotic, if your health care provider approves.  Take over-the-counter and prescription medicines only as told by your health care provider.  If you were prescribed an antibiotic, take it as told by your health care provider. Do not stop taking the antibiotic even if you start to feel better.  Keep all follow-up visits as told by your health care provider. This is important. Contact a health care provider if:  You have pain in your abdomen.  You have bloating.  You have cramps.  You have not had a bowel movement in 3 days. Get help right away if:  Your pain gets worse.  Your bloating becomes very bad.  You have a fever or chills, and your symptoms suddenly get worse.  You vomit.  You have bowel movements that are bloody or black.  You have bleeding from your rectum. Summary  Diverticulosis is a condition that develops when small  pouches (diverticula) form in the wall of the large intestine (colon).  You may have a few pouches or many of them.  This condition is most often diagnosed during an exam for other colon problems.  If you have had an infection related to diverticulosis, treatment may include increasing the fiber in your diet, taking supplements, or taking medicines. This information is not intended to replace advice given to you by your health care provider. Make sure you discuss any questions you have with your health care provider. Document Released: 06/25/2004 Document Revised: 08/17/2016 Document Reviewed: 08/17/2016 Elsevier Interactive Patient Education  2019 [place].    Colon Polyps  Polyps are tissue growths inside the body. Polyps can grow in many places, including the large intestine (colon). A polyp may be a round bump or a mushroom-shaped growth. You could have one polyp or several. Most colon polyps are noncancerous (benign). However, some colon polyps can become cancerous over time. Finding and removing the polyps early can help prevent this. What are  the causes? The exact cause of colon polyps is not known. What increases the risk? You are more likely to develop this condition if you:  Have a family history of colon cancer or colon polyps.  Are older than 26 or older than 45 if you are African American.  Have inflammatory bowel disease, such as ulcerative colitis or Crohn's disease.  Have certain hereditary conditions, such as: ? Familial adenomatous polyposis. ? Lynch syndrome. ? Turcot syndrome. ? Peutz-Jeghers syndrome.  Are overweight.  Smoke cigarettes.  Do not get enough exercise.  Drink too much alcohol.  Eat a diet that is high in fat and red meat and low in fiber.  Had childhood cancer that was treated with abdominal radiation. What are the signs or symptoms? Most polyps do not cause symptoms. If you have symptoms, they may include:  Blood coming from  your rectum when having a bowel movement.  Blood in your stool. The stool may look dark red or black.  Abdominal pain.  A change in bowel habits, such as constipation or diarrhea. How is this diagnosed? This condition is diagnosed with a colonoscopy. This is a procedure in which a lighted, flexible scope is inserted into the anus and then passed into the colon to examine the area. Polyps are sometimes found when a colonoscopy is done as part of routine cancer screening tests. How is this treated? Treatment for this condition involves removing any polyps that are found. Most polyps can be removed during a colonoscopy. Those polyps will then be tested for cancer. Additional treatment may be needed depending on the results of testing. Follow these instructions at home: Lifestyle  Maintain a healthy weight, or lose weight if recommended by your health care provider.  Exercise every day or as told by your health care provider.  Do not use any products that contain nicotine or tobacco, such as cigarettes and e-cigarettes. If you need help quitting, ask your health care provider.  If you drink alcohol, limit how much you have: ? 0-1 drink a day for women. ? 0-2 drinks a day for men.  Be aware of how much alcohol is in your drink. In the [place] one drink equals one 12 oz bottle of beer (355 mL), one 5 oz glass of wine (148 mL), or one 1 oz shot of hard liquor (44 mL). Eating and drinking   Eat foods that are high in fiber, such as fruits, vegetables, and whole grains.  Eat foods that are high in calcium and vitamin D, such as milk, cheese, yogurt, eggs, liver, fish, and broccoli.  Limit foods that are high in fat, such as fried foods and desserts.  Limit the amount of red meat and processed meat you eat, such as hot dogs, sausage, bacon, and lunch meats. General instructions  Keep all follow-up visits as told by your health care provider. This is important. ? This includes having  regularly scheduled colonoscopies. ? Talk to your health care provider about when you need a colonoscopy. Contact a health care provider if:  You have new or worsening bleeding during a bowel movement.  You have new or increased blood in your stool.  You have a change in bowel habits.  You lose weight for no known reason. Summary  Polyps are tissue growths inside the body. Polyps can grow in many places, including the colon.  Most colon polyps are noncancerous (benign), but some can become cancerous over time.  This condition is diagnosed with a colonoscopy.  Treatment for this condition involves removing any polyps that are found. Most polyps can be removed during a colonoscopy. This information is not intended to replace advice given to you by your health care provider. Make sure you discuss any questions you have with your health care provider. Document Released: 06/24/2004 Document Revised: 01/13/2018 Document Reviewed: 01/13/2018 Elsevier Interactive Patient Education  2019 [place], Care After These instructions provide you with information about caring for yourself after your procedure. Your health care provider may also give you more specific instructions. Your treatment has been planned according to current medical practices, but problems sometimes occur. Call your health care provider if you have any problems or questions after your procedure. What can I expect after the procedure? After your procedure, you may:  Feel sleepy for several hours.  Feel clumsy and have poor balance for several hours.  Feel forgetful about what happened after the procedure.  Have poor judgment for several hours.  Feel nauseous or vomit.  Have a sore throat if you had a breathing tube during the procedure. Follow these instructions at home: For at least 24 hours after the procedure:      Have a responsible adult stay with you. It is important to  have someone help care for you until you are awake and alert.  Rest as needed.  Do not: ? Participate in activities in which you could fall or become injured. ? Drive. ? Use heavy machinery. ? Drink alcohol. ? Take sleeping pills or medicines that cause drowsiness. ? Make important decisions or sign legal documents. ? Take care of children on your own. Eating and drinking  Follow the diet that is recommended by your health care provider.  If you vomit, drink water, juice, or soup when you can drink without vomiting.  Make sure you have little or no nausea before eating solid foods. General instructions  Take over-the-counter and prescription medicines only as told by your health care provider.  If you have sleep apnea, surgery and certain medicines can increase your risk for breathing problems. Follow instructions from your health care provider about wearing your sleep device: ? Anytime you are sleeping, including during daytime naps. ? While taking prescription pain medicines, sleeping medicines, or medicines that make you drowsy.  If you smoke, do not smoke without supervision.  Keep all follow-up visits as told by your health care provider. This is important. Contact a health care provider if:  You keep feeling nauseous or you keep vomiting.  You feel light-headed.  You develop a rash.  You have a fever. Get help right away if:  You have trouble breathing. Summary  For several hours after your procedure, you may feel sleepy and have poor judgment.  Have a responsible adult stay with you for at least 24 hours or until you are awake and alert. This information is not intended to replace advice given to you by your health care provider. Make sure you discuss any questions you have with your health care provider. Document Released: 01/19/2016 Document Revised: 05/14/2017 Document Reviewed: 01/19/2016 Elsevier Interactive Patient Education  2019 [employer].

## 2018-11-14 ENCOUNTER — Telehealth: Payer: Self-pay

## 2018-11-14 ENCOUNTER — Encounter: Payer: Self-pay | Admitting: Internal Medicine

## 2018-11-14 NOTE — Telephone Encounter (Signed)
Per RMR-  Send letter to patient.  Send copy of letter with path to referring provider and PCP.    Would offer OV with extender if not already scheduled in reference to chronic diarrhea if needed.

## 2018-11-15 ENCOUNTER — Encounter: Payer: Self-pay | Admitting: Internal Medicine

## 2018-11-16 ENCOUNTER — Encounter: Payer: Self-pay | Admitting: Internal Medicine

## 2018-11-16 DIAGNOSIS — G4733 Obstructive sleep apnea (adult) (pediatric): Secondary | ICD-10-CM | POA: Diagnosis not present

## 2018-11-16 DIAGNOSIS — E1140 Type 2 diabetes mellitus with diabetic neuropathy, unspecified: Secondary | ICD-10-CM | POA: Diagnosis not present

## 2018-11-16 NOTE — Telephone Encounter (Signed)
OV made °

## 2018-11-20 IMAGING — CT CT ABDOMEN WO/W CM
3 of 10 series · 10 of 46 positions shown, 16 images · IV contrast (Isovue)
Comparison: Abdominal ultrasound of 02/03/2018. Chest CT
12/17/2007.

CLINICAL DATA: Right-sided abdominal pain for 1 year. History of
endometriosis.

EXAM:
CT ABDOMEN WITHOUT AND WITH CONTRAST
TECHNIQUE: Multidetector CT imaging of the abdomen was performed following the
standard protocol before and following the bolus administration of
intravenous contrast.
CONTRAST:  100mL [id number] IOPAMIDOL ([id number]) INJECTION 61%

[Series 3: axial arterial · axial · arterial · 0.79mm/px · 1 of 92 slices shown]
[im 14/92  soft-tissue]
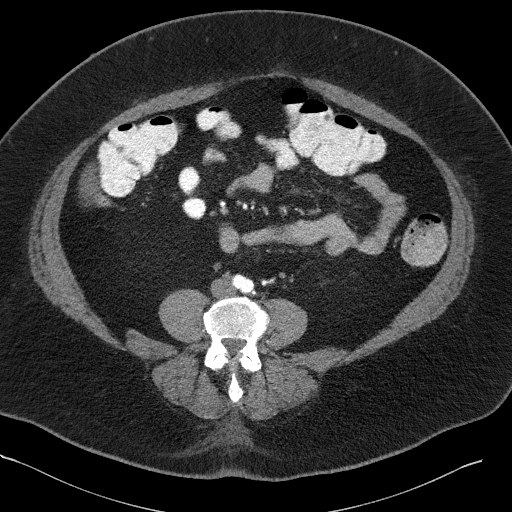

[Series 4: axial venous · axial · portal-venous · 0.79mm/px · z∈[-276,-81]mm · 6 of 92 slices shown, 11 images]
[im 14/92  soft-tissue]
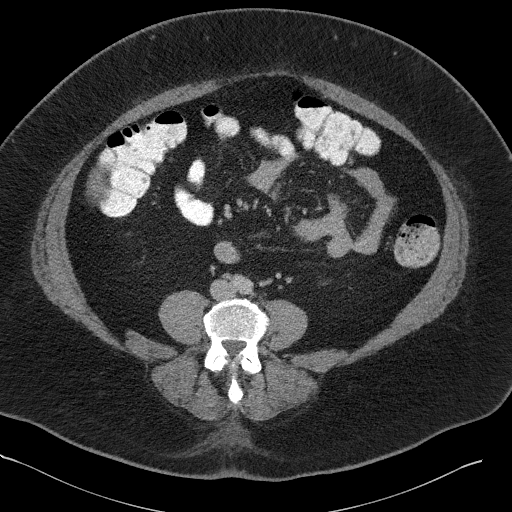
[im 14/92  bone]
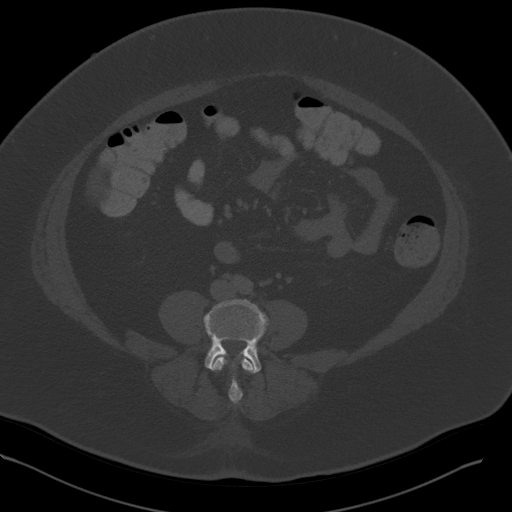
[im 27/92  soft-tissue]
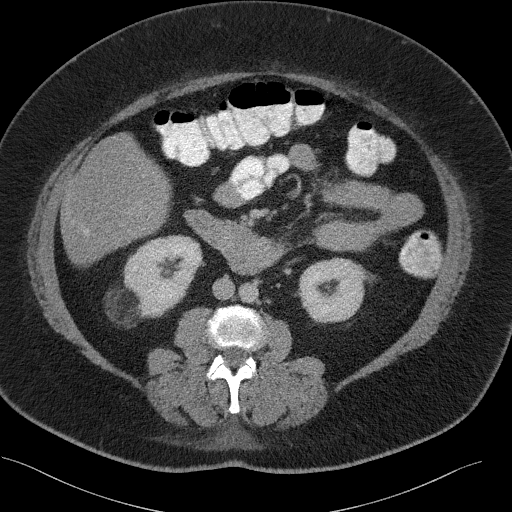
[im 40/92  soft-tissue]
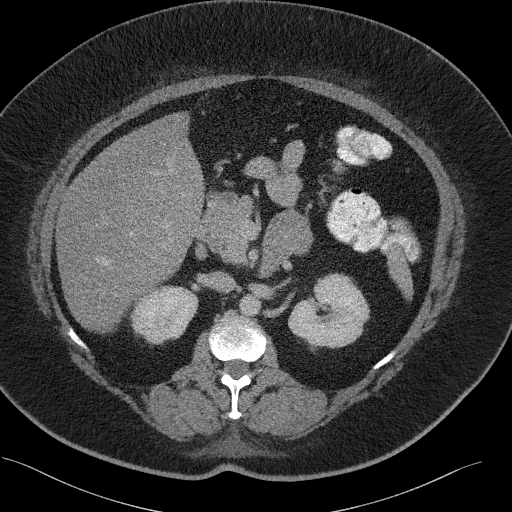
[im 40/92  lung]
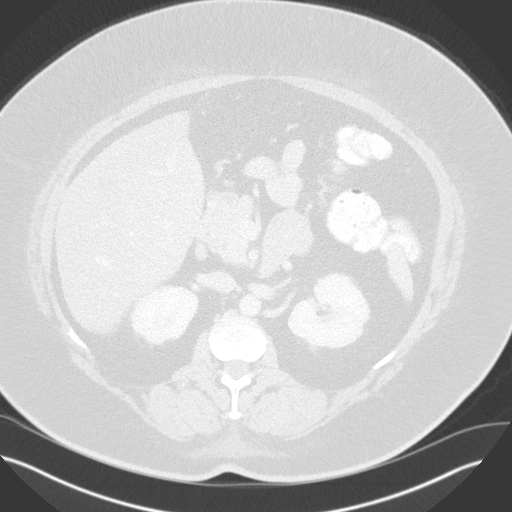
[im 53/92  soft-tissue]
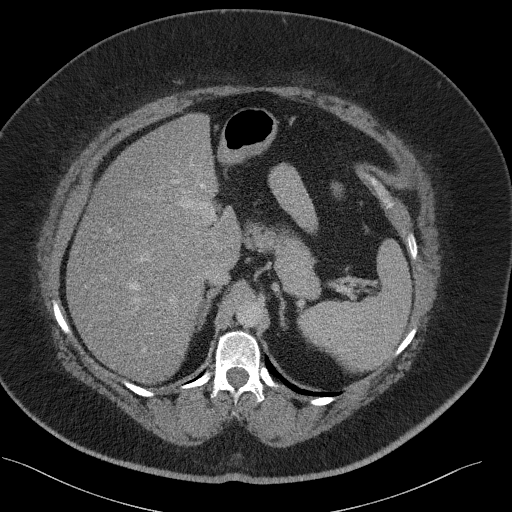
[im 53/92  lung]
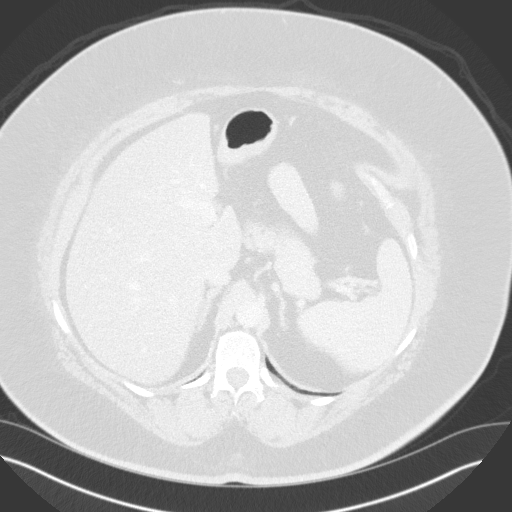
[im 66/92  soft-tissue]
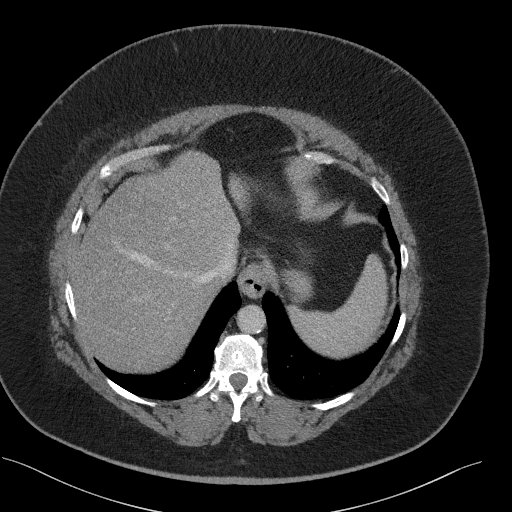
[im 66/92  lung]
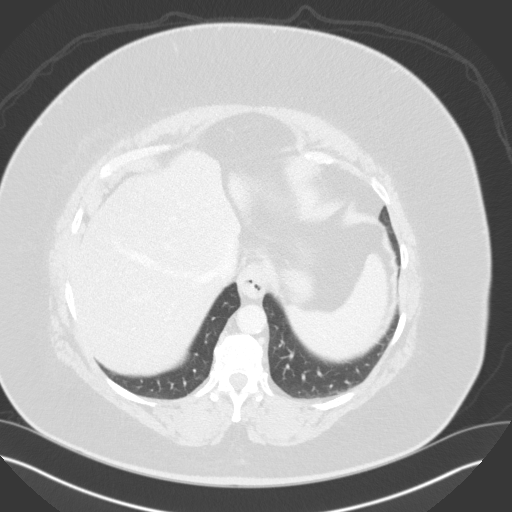
[im 79/92  soft-tissue]
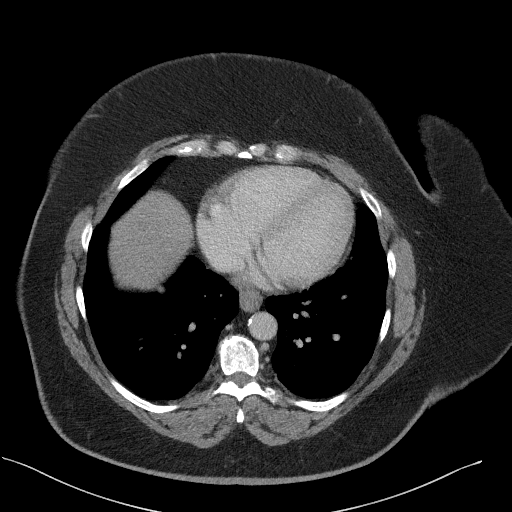
[im 79/92  lung]
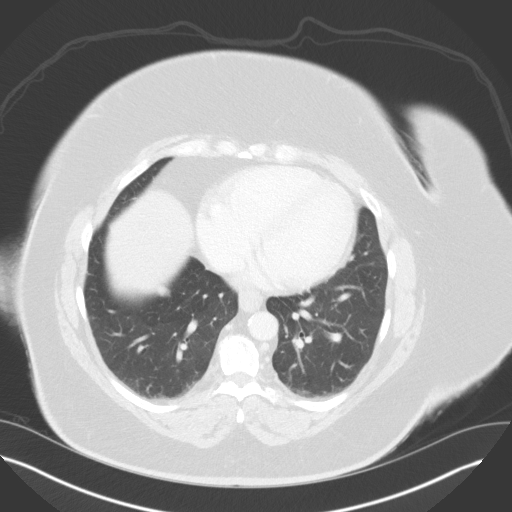

[Series 11: coronal arterial · coronal · arterial · 0.54mm/px · 3 of 101 slices shown, 4 images]
[im 26/101  soft-tissue]
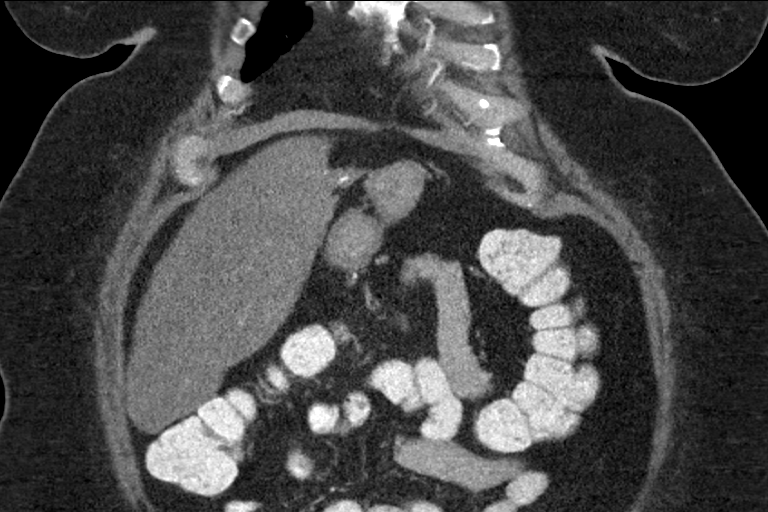
[im 51/101  soft-tissue]
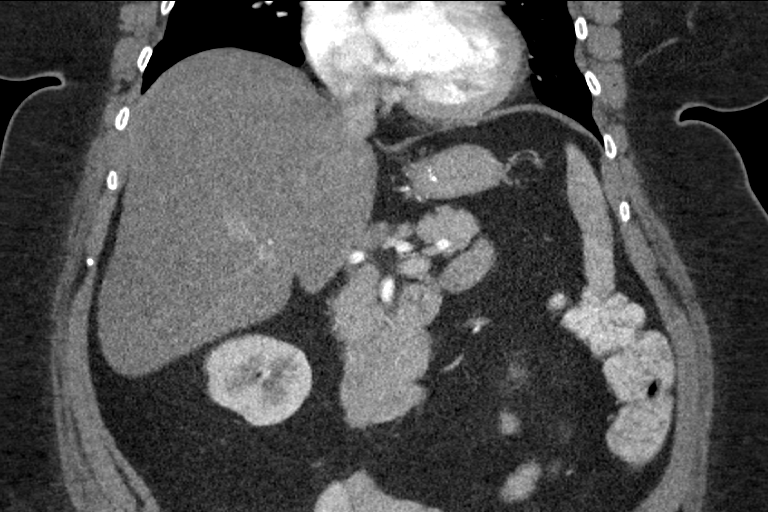
[im 51/101  bone]
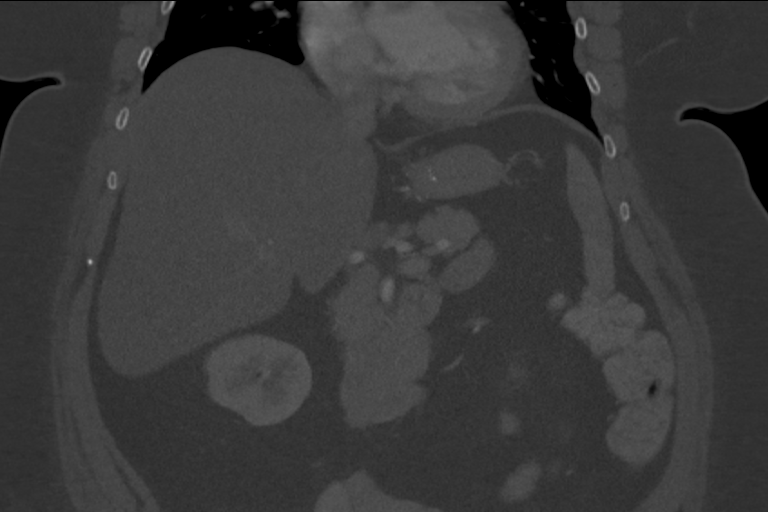
[im 76/101  soft-tissue]
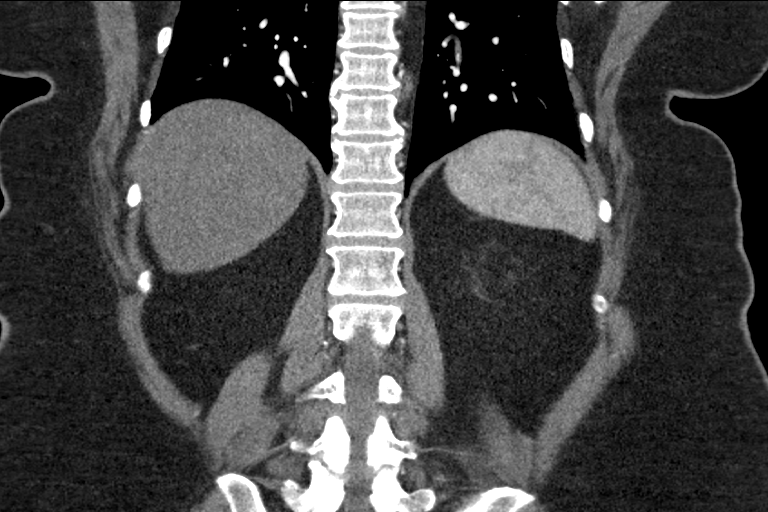

[10 of 46 positions shown; findings below may reference images not displayed]

FINDINGS: Lower chest: 4 mm left lower lobe pulmonary nodule on image [DATE].
Not readily apparent on the 4225 chest CT. Normal heart size without
pericardial or pleural effusion.

Hepatobiliary: Moderate hepatic steatosis. No hypervascular liver
lesion. Hepatomegaly at 20 cm craniocaudal.

There is lateral segment left liver lobe atrophy which was present
on the remote 4225 chest CT. New or increased calcifications with
new pneumobilia. Example image 33/2.

Pancreas: Normal, without mass or ductal dilatation.

Spleen: Normal in size, without focal abnormality.

Adrenals/Urinary Tract: Normal adrenal glands. No renal calculi or
hydronephrosis. Interpolar right renal angiomyolipoma measures 3.3 x
2.9 cm on image 66/4. No complicating hemorrhage.

Stomach/Bowel: Normal remainder of the stomach. Normal abdominal
bowel loops.

Vascular/Lymphatic: Aortic atherosclerosis. No retroperitoneal or
retrocrural adenopathy.

Other: No ascites.

Musculoskeletal: Lower thoracic spondylosis.
IMPRESSION: 1. Hepatic steatosis and hepatomegaly. Area of chronic volume loss
within the lateral segment left liver lobe. New foci of
calcification and pneumobilia within. Likely evolution of a remote
insult-possibly related to prior cholecystectomy.
2. Right lower lobe angiomyolipoma.
3. Left lower lobe pulmonary nodule. No follow-up needed if patient
is low-risk. Non-contrast chest CT can be considered in 12 months if
patient is high-risk. This recommendation follows the consensus
statement: Guidelines for Management of Incidental Pulmonary Nodules
Detected on CT Images: From the [HOSPITAL] 1503; Radiology
1503; [DATE].
4.  Aortic Atherosclerosis ([id number]).
5.  Tiny hiatal hernia.

## 2018-11-22 DIAGNOSIS — E782 Mixed hyperlipidemia: Secondary | ICD-10-CM | POA: Diagnosis not present

## 2018-11-22 DIAGNOSIS — K219 Gastro-esophageal reflux disease without esophagitis: Secondary | ICD-10-CM | POA: Diagnosis not present

## 2018-11-22 DIAGNOSIS — E1165 Type 2 diabetes mellitus with hyperglycemia: Secondary | ICD-10-CM | POA: Diagnosis not present

## 2018-11-22 DIAGNOSIS — I10 Essential (primary) hypertension: Secondary | ICD-10-CM | POA: Diagnosis not present

## 2018-11-24 ENCOUNTER — Encounter (HOSPITAL_COMMUNITY): Payer: Self-pay | Admitting: Internal Medicine

## 2018-11-25 ENCOUNTER — Telehealth: Payer: Self-pay | Admitting: Internal Medicine

## 2018-11-25 NOTE — Telephone Encounter (Signed)
New message  Pt c/o medication issue:  1. Name of Medication:   lisinopril-hydrochlorothiazide (PRINZIDE,ZESTORETIC) 20-12.5 MG tablet   furosemide (LASIX) 40 MG tablet     2. How are you currently taking this medication (dosage and times per day)? Two times daily  3. Are you having a reaction (difficulty breathing--STAT)? No   4. What is your medication issue? Per Cordelia Pen Dr. Juel Burrow office patient wants to know why she is having to take lisinopril twice daily.  Patient is taking furosemide and HCTZ does she need both?

## 2018-11-25 NOTE — Telephone Encounter (Signed)
Spoke with Dr. Juel Burrow office. Adv that patient was seen once in [place] off 05/2016 and not since. She will let Dr. Nevada Crane know.

## 2018-11-25 NOTE — Telephone Encounter (Signed)
Pt last seen with Dr. Harrington Challenger was 06/05/16. I will send this message to Dr. Alan Ripper nurse for further investigation. Pt was recently in the hospital.

## 2018-12-07 DIAGNOSIS — L918 Other hypertrophic disorders of the skin: Secondary | ICD-10-CM | POA: Diagnosis not present

## 2018-12-12 ENCOUNTER — Telehealth: Payer: Self-pay | Admitting: Gastroenterology

## 2018-12-12 NOTE — Telephone Encounter (Signed)
Received records from [facility]    May 2002 GI hepatology liver clinic note Assessment and plan: 68 year old female with history of nonalcoholic steatohepatitis, lesion found on MRI probable focal fat versus neoplasm.  Repeat MRI to evaluate this mass planned.  Labs May 2002: AST 76, ALT 79, total bilirubin 0.7, alkaline phosphatase 110  Clinic note dated 08/04/2000: 2 areas of abnormal signal intensity on MRI of the liver.  Gallium scan done to follow this up which showed increased uptake at the hepatic vein confluence.  This was possibly metastasis versus hepatocellular carcinoma.  They were unable to see the second lesion on the gallium scan.  Affected to be increased fat in the liver, plans to follow closely with MRI to ensure stability.  ERCP 07/22/2000 performed due to abnormal LFTs and enhancing liver lesion on MRI.  Showed atrophic segments 2 and 3 of the liver with ductal proliferation.  Liver clinic note dated 11/05/1999: Per office note, patient underwent cholecystectomy, during the procedure they noticed nodularity and fatty appearance of the liver, biopsies performed, consistent with fatty infiltrates, steatohepatitis.  There was some question as to possible toxic effect.  Biopsies were taken in the right and left lobes of the liver and these 2 biopsies showed different levels of disease activity.  Do not have access to liver biopsy results.  We requested records from [facility] regarding patient's prior cholecystectomy/liver biopsy, they were able to locate records from 2007 to present and could not find any.  Her procedure was done prior to this.  PLEASE LET PT KNOW WE REVIEWED AVAILABLE RECORDS. KEEP OV IN 01/2019.

## 2018-12-12 NOTE — Telephone Encounter (Signed)
Lmom, waiting on a return call.  

## 2018-12-13 NOTE — Telephone Encounter (Signed)
Pt returned call and is aware that [facility] received her records. Pt will f/u 01/2019.

## 2018-12-13 NOTE — Telephone Encounter (Signed)
Lmom, waiting on a return call.  

## 2018-12-28 DIAGNOSIS — E1165 Type 2 diabetes mellitus with hyperglycemia: Secondary | ICD-10-CM | POA: Diagnosis not present

## 2018-12-28 DIAGNOSIS — I10 Essential (primary) hypertension: Secondary | ICD-10-CM | POA: Diagnosis not present

## 2018-12-28 DIAGNOSIS — I509 Heart failure, unspecified: Secondary | ICD-10-CM | POA: Diagnosis not present

## 2018-12-28 DIAGNOSIS — K760 Fatty (change of) liver, not elsewhere classified: Secondary | ICD-10-CM | POA: Diagnosis not present

## 2018-12-28 DIAGNOSIS — Z6841 Body Mass Index (BMI) 40.0 and over, adult: Secondary | ICD-10-CM | POA: Diagnosis not present

## 2018-12-28 DIAGNOSIS — E782 Mixed hyperlipidemia: Secondary | ICD-10-CM | POA: Diagnosis not present

## 2018-12-28 DIAGNOSIS — K219 Gastro-esophageal reflux disease without esophagitis: Secondary | ICD-10-CM | POA: Diagnosis not present

## 2018-12-28 DIAGNOSIS — L918 Other hypertrophic disorders of the skin: Secondary | ICD-10-CM | POA: Diagnosis not present

## 2018-12-30 DIAGNOSIS — E782 Mixed hyperlipidemia: Secondary | ICD-10-CM | POA: Diagnosis not present

## 2018-12-30 DIAGNOSIS — N951 Menopausal and female climacteric states: Secondary | ICD-10-CM | POA: Diagnosis not present

## 2018-12-30 DIAGNOSIS — K219 Gastro-esophageal reflux disease without esophagitis: Secondary | ICD-10-CM | POA: Diagnosis not present

## 2018-12-30 DIAGNOSIS — N183 Chronic kidney disease, stage 3 (moderate): Secondary | ICD-10-CM | POA: Diagnosis not present

## 2018-12-30 DIAGNOSIS — K760 Fatty (change of) liver, not elsewhere classified: Secondary | ICD-10-CM | POA: Diagnosis not present

## 2018-12-30 DIAGNOSIS — I10 Essential (primary) hypertension: Secondary | ICD-10-CM | POA: Diagnosis not present

## 2018-12-30 DIAGNOSIS — E1165 Type 2 diabetes mellitus with hyperglycemia: Secondary | ICD-10-CM | POA: Diagnosis not present

## 2018-12-30 DIAGNOSIS — E1122 Type 2 diabetes mellitus with diabetic chronic kidney disease: Secondary | ICD-10-CM | POA: Diagnosis not present

## 2018-12-30 DIAGNOSIS — I5032 Chronic diastolic (congestive) heart failure: Secondary | ICD-10-CM | POA: Diagnosis not present

## 2019-01-12 DIAGNOSIS — K219 Gastro-esophageal reflux disease without esophagitis: Secondary | ICD-10-CM | POA: Diagnosis not present

## 2019-01-12 DIAGNOSIS — I10 Essential (primary) hypertension: Secondary | ICD-10-CM | POA: Diagnosis not present

## 2019-01-12 DIAGNOSIS — E782 Mixed hyperlipidemia: Secondary | ICD-10-CM | POA: Diagnosis not present

## 2019-01-12 DIAGNOSIS — E1165 Type 2 diabetes mellitus with hyperglycemia: Secondary | ICD-10-CM | POA: Diagnosis not present

## 2019-01-18 ENCOUNTER — Other Ambulatory Visit: Payer: Self-pay

## 2019-01-18 ENCOUNTER — Ambulatory Visit (INDEPENDENT_AMBULATORY_CARE_PROVIDER_SITE_OTHER): Payer: Medicare HMO | Admitting: Nurse Practitioner

## 2019-01-18 ENCOUNTER — Encounter: Payer: Self-pay | Admitting: Nurse Practitioner

## 2019-01-18 DIAGNOSIS — R160 Hepatomegaly, not elsewhere classified: Secondary | ICD-10-CM | POA: Diagnosis not present

## 2019-01-18 DIAGNOSIS — K529 Noninfective gastroenteritis and colitis, unspecified: Secondary | ICD-10-CM

## 2019-01-18 DIAGNOSIS — K219 Gastro-esophageal reflux disease without esophagitis: Secondary | ICD-10-CM

## 2019-01-18 MED ORDER — DICYCLOMINE HCL 10 MG PO CAPS: 10.0000 mg | ORAL_CAPSULE | Freq: Three times a day (TID) | ORAL | 3 refills | Status: DC | PRN

## 2019-01-18 NOTE — Progress Notes (Signed)
Referring Provider: Celene Squibb, MD Primary Care Physician:  Celene Squibb, MD Primary GI:  Dr. Gala Romney  NOTE: Service was provided via telemedicine and was requested by the patient due to COVID-19 pandemic.  Method of visit: Telephone  Patient Location: Home  Provider Location: Office  Reason for Phone Visit: Post-procedure follow-up  The patient was consented to phone follow-up via telephone encounter including billing of the encounter (yes/no): Yes  Persons present on the phone encounter, with roles: None  Total time (minutes) spent on medical discussion: 26 minutes  Chief Complaint  Patient presents with  . Diarrhea  . Abdominal Pain    usually occurs then hab bm later    HPI:   Ebony Stevens is a 68 y.o. female who presents for virtual visit regarding: Procedure follow-up.  The patient was last seen in our office 09/28/2018 for chronic diarrhea and hepatomegaly.  At that time noted diarrhea for years and before that constipation.  Diarrhea had been ongoing for more than 10 years, occasional fecal incontinence with sneezing, having 4-5 diarrhea stools a day.  It tends to begin solid and become more loose.  Sometimes nocturnal bowel movement.  Decreasing metformin from 1000 mg to 500 mg did help some.  Noted previous gallbladder surgery in 2000 and a "took some of my liver at the same time" query biopsy, followed up with [facility].  She stopped going because they were not doing anything.  States she had a liver mass that only showed up on MRI.  Was previously on ranitidine and came off of that but requesting alternative over-the-counter medication and she was advised for Pepcid AC.  At her last visit she was not having any hematochezia or melena.  Chronic right upper quadrant abdominal pain around the flank prior to cholecystectomy, not related to meals.  CT of the abdomen and pelvis in May 2019 showed moderate hepatic steatosis and hepatomegaly.  Likely evolution of remote insult  possibly related to prior cholecystectomy.  Left lower lobe pulmonary nodule recommended consider chest CT in 12 months of high risk.  Diarrhea felt to include differentials of bile salt diarrhea, diarrhea related metformin, celiac disease, microscopic colitis, IBS, less likely IBD or malignancy.  Recommended celiac screening.  Also recommended colonoscopy with possible random colon biopsies.  Labs completed 09/28/2018 which overall looked well despite mild elevation of creatinine.  Celiac screen negative.  Colonoscopy completed 11/10/2018 which found scattered medium mouth diverticula in the sigmoid colon and descending colon, single 5 mm polyp in the descending colon which was semi-pedunculated and resected/retrieved, otherwise normal. Surgical pathology found the polyp to be hyperplastic and random colon biopsies to be colonic mucosa without significant histopathological changes. Recommended repeat colonoscopy in 10 years.  Previously requested records from [facility] were received.  Outlined as per below.  Today she states she's doing ok overall. She is taking Pepcid AC for indigestion which helps. Still with diarrhea which occurs 3-4 times a day (often has a normal/soft/small bowel movement in the morning). Has been having lower abdominal pain, described as variable; not associated with urgent need to have a bowel movement. Sometimes the pain resolves on its own, sometimes it stops after a bowel movement. Denies hematochezia, melena. She previous had Metformin cut in half to 500 mg daily, hich helped the frequency of her diarrhea. Hgb a1c worsened so her Metformin was increased back to 1,000 mg daily and has not had recurrent worsening. GERD is acceptable to her at this time. Denies  URI or flu-like symptoms. Does have an intermittent cough but has known seasonal allergies and feels this is causing her cough.  Received records from [facility]   May 2002 GI hepatology liver clinic note Assessment and plan:  68 year old female with history of nonalcoholic steatohepatitis, lesion found on MRI probable focal fat versus neoplasm.  Repeat MRI to evaluate this mass planned.  Labs May 2002: AST 76, ALT 79, total bilirubin 0.7, alkaline phosphatase 110  Clinic note dated 08/04/2000: 2 areas of abnormal signal intensity on MRI of the liver.  Gallium scan done to follow this up which showed increased uptake at the hepatic vein confluence.  This was possibly metastasis versus hepatocellular carcinoma.  They were unable to see the second lesion on the gallium scan.  Affected to be increased fat in the liver, plans to follow closely with MRI to ensure stability.  ERCP 07/22/2000 performed due to abnormal LFTs and enhancing liver lesion on MRI.  Showed atrophic segments 2 and 3 of the liver with ductal proliferation.  Liver clinic note dated 11/05/1999: Per office note, patient underwent cholecystectomy, during the procedure they noticed nodularity and fatty appearance of the liver, biopsies performed, consistent with fatty infiltrates, steatohepatitis.  There was some question as to possible toxic effect.  Biopsies were taken in the right and left lobes of the liver and these 2 biopsies showed different levels of disease activity.  Do not have access to liver biopsy results.  We requested records from [facility] regarding patient's prior cholecystectomy/liver biopsy, they were able to locate records from 2007 to present and could not find any.  Her procedure was done prior to this.  Past Medical History:  Diagnosis Date  . Asthma   . Chest pain 07/11/2015  . Diabetes mellitus without complication ([place])   . Dyspnea 07/11/2015  . Excessive bleeding in the premenopausal period   . Fibromyalgia   . GERD (gastroesophageal reflux disease)   . HTN (hypertension)   . Hypertension   . Localized edema   . Mixed hyperlipidemia   . Type 2 diabetes mellitus ([place])     Past Surgical History:  Procedure  Laterality Date  . ABDOMINAL HYSTERECTOMY     1975 partial hysterectomy,  1978 "complete hysterctomy and retack bladder". states it didn't take.   Marland Kitchen BIOPSY  11/10/2018   Procedure: BIOPSY;  Surgeon: Daneil Dolin, MD;  Location: [facility];  Service: Endoscopy;;  colon   . CHOLECYSTECTOMY    . COLONOSCOPY WITH PROPOFOL N/A 11/10/2018   Procedure: COLONOSCOPY WITH PROPOFOL;  Surgeon: Daneil Dolin, MD;  Location: [facility];  Service: Endoscopy;  Laterality: N/A;  8:45am  . POLYPECTOMY  11/10/2018   Procedure: POLYPECTOMY;  Surgeon: Daneil Dolin, MD;  Location: [facility];  Service: Endoscopy;;  colon   . TONSILLECTOMY      Current Outpatient Medications  Medication Sig Dispense Refill  . amLODipine (NORVASC) 5 MG tablet Take 5 mg by mouth daily.    Marland Kitchen aspirin EC 325 MG tablet Take 325 mg by mouth daily.    Marland Kitchen atorvastatin (LIPITOR) 80 MG tablet Take 80 mg by mouth daily.  5  . cetirizine (ZYRTEC) 10 MG tablet Take 10 mg by mouth daily.    Marland Kitchen estradiol (ESTRACE) 2 MG tablet Take 2 mg by mouth daily.    . furosemide (LASIX) 40 MG tablet TAKE ONE TABLET BY MOUTH EVERY OTHER DAY (Patient taking differently: Take 40 mg by mouth daily. ) 15 tablet  2  . glipiZIDE (GLUCOTROL) 10 MG tablet Take 10 mg by mouth 2 (two) times daily before a meal.    . insulin NPH Human (HUMULIN N,NOVOLIN N) 100 UNIT/ML injection Inject 10 Units into the skin 3 (three) times daily. Before Meals    . insulin NPH-regular Human (NOVOLIN 70/30 RELION) (70-30) 100 UNIT/ML injection Inject 60 Units into the skin at bedtime.     Marland Kitchen lisinopril (PRINIVIL,ZESTRIL) 40 MG tablet Take 1 tablet by mouth daily.    . metFORMIN (GLUCOPHAGE) 1000 MG tablet Take 1,000 mg by mouth 2 (two) times daily with a meal.    . metoprolol succinate (TOPROL-XL) 100 MG 24 hr tablet Take 100 mg by mouth daily. Take with or immediately following a meal.    . potassium chloride (K-DUR) 10 MEQ tablet TAKE ONE TABLET BY MOUTH EVERY OTHER DAY  (Patient taking differently: Take 10 mEq by mouth daily. ) 15 tablet 2   No current facility-administered medications for this visit.     Allergies as of 01/18/2019  . (No Known Allergies)    Family History  Problem Relation Age of Onset  . Cancer Sister        ovarian  . Diabetes Brother   . Colon cancer Neg Hx     Social History   Socioeconomic History  . Marital status: Married    Spouse name: Not on file  . Number of children: Not on file  . Years of education: Not on file  . Highest education level: Not on file  Occupational History  . Not on file  Social Needs  . Financial resource strain: Not on file  . Food insecurity:    Worry: Not on file    Inability: Not on file  . Transportation needs:    Medical: Not on file    Non-medical: Not on file  Tobacco Use  . Smoking status: Never Smoker  . Smokeless tobacco: Never Used  Substance and Sexual Activity  . Alcohol use: No  . Drug use: No  . Sexual activity: Not Currently  Lifestyle  . Physical activity:    Days per week: Not on file    Minutes per session: Not on file  . Stress: Not on file  Relationships  . Social connections:    Talks on phone: Not on file    Gets together: Not on file    Attends religious service: Not on file    Active member of club or organization: Not on file    Attends meetings of clubs or organizations: Not on file    Relationship status: Not on file  Other Topics Concern  . Not on file  Social History Narrative   ** Merged History Encounter **        Review of Systems: General: Negative for anorexia, weight loss, fever, chills, fatigue, weakness. ENT: Negative for hoarseness, difficulty swallowing. CV: Negative for chest pain, angina, palpitations, peripheral edema.  Respiratory: Negative for dyspnea at rest, sputum, wheezing. Admits chronic cough and intermittent chronic shortness of breath (due to allergies) which are no worse than baseline. GI: See history of present  illness. Endo: Negative for unusual weight change.  Heme: Negative for bruising or bleeding. Allergy: Negative for rash or hives.  Physical Exam: Note: limited exam due to virtual visit General:   Alert and oriented. Pleasant and cooperative.  Ears:  Normal auditory acuity. Skin:  Intact without facial significant lesions or rashes. Neurologic:  Alert and oriented x4 Psych:  Alert  and cooperative. Normal mood and affect.

## 2019-01-18 NOTE — Patient Instructions (Signed)
Your health issues we discussed today were:   Chronic diarrhea: 1. I have sent in Bentyl 10 mg to your pharmacy.  Take this for sing in the morning to see if it helps prevent diarrhea later in the day 2. You can take Bentyl up to 3 times a day, as needed for diarrhea or abdominal cramping 3. Call us for any severe or worsening symptoms  GERD (heartburn): 1. Continue taking Pepcid AC 2. Notify us if you have worsening symptoms and need other treatment options  Liver abnormality: 1. As we discussed, your liver appears to have "fatty liver" which is common in diabetes 2. There is no sign of significant scarring or cirrhosis 3. We will recheck your liver labs at your follow-up office visit 4. We will forward records from [facility] that we received to your primary care doctor  Overall I recommend:  1. Return for follow-up in 4 months 2. Call us if you have any questions or concerns   Because of recent events of COVID-19 ("Coronavirus"), follow CDC recommendations:  1. Wash your hand frequently 2. Avoid touching your face 3. Stay away from people who are sick 4. If you have symptoms such as fever, cough, shortness of breath then call your healthcare provider for further guidance 5. If you are sick, STAY AT HOME unless otherwise directed by your healthcare provider. 6. Follow directions from state and national officials regarding staying safe   At [facility] we value your feedback. You may receive a survey about your visit today. Please share your experience as we strive to create trusting relationships with our patients to provide genuine, compassionate, quality care.  We appreciate your understanding and patience as we review any laboratory studies, imaging, and other diagnostic tests that are ordered as we care for you. Our office policy is 5 business days for review of these results, and any emergent or urgent results are addressed in a timely manner for your best interest.  If you do not hear from our office in 1 week, please contact us.   We also encourage the use of MyChart, which contains your medical information for your review as well. If you are not enrolled in this feature, an access code is on this after visit summary for your convenience. Thank you for allowing [place] to be involved in your care.  It was great to see you today!  I hope you have a great day!!

## 2019-01-18 NOTE — Assessment & Plan Note (Signed)
Overall she feels GERD symptoms are well managed on Pepcid AC as needed.  Declines any other GERD treatment at this time.  Continue to monitor, notify of any worsening symptoms.  Follow-up in 4 months.

## 2019-01-18 NOTE — Assessment & Plan Note (Signed)
Records received from [place].  Labs have been normal.  Updated CT reassuring in 2019.  At this point we can recheck her liver labs at her follow-up office visit as they were just checked 3 months ago.  We will fax copies of [facility] records to her primary care provider.  Follow-up in 4 months.

## 2019-01-18 NOTE — Addendum Note (Signed)
Addended by: Gordy Levan, ERIC A on: 01/18/2019 02:38 PM   Modules accepted: Orders

## 2019-01-18 NOTE — Assessment & Plan Note (Signed)
Chronic diarrhea which had some improvement when she had her metformin dose decreased to 500 mg.  It is now been re-increased to 1000 mg due to elevated A1c.  She has not had re-worsening of her diarrhea.  Colonoscopy is reassuring.  Given abdominal discomfort somewhat but not definitively associated with her loose stools differentials that remain include IBS diarrhea type and bile salt diarrhea.  At this point I will trial her on Bentyl 10 mg 3 times a day as needed.  I recommended she take 1 first thing when she wakes up to see if this helps prevent diarrhea further and today.  We will have her follow-up in 4 months to see how she is doing.  She can call us with any worsening symptoms before then.

## 2019-01-19 ENCOUNTER — Encounter: Payer: Self-pay | Admitting: Internal Medicine

## 2019-02-06 ENCOUNTER — Telehealth: Payer: Self-pay

## 2019-02-06 NOTE — Telephone Encounter (Signed)
Pt called. She was prescribed Bentyl on 01/18/19 over the phone by [facility]. Pt  D/c Bentyl because the diarrhea got worse when she took it. Pt wasn't able to make it to the bathroom in time before she had a bowel movement. Since pt has been off of medication last week, she is having loose stool twice daily. When Bentyl was taken, she was 4-5 times on the days it was taken. Pt is wondering if she should restart the medication. Pt isn't currently taking any otc antidiarrhea medication since the diarrhea isn't as bad.   [phone]

## 2019-02-07 NOTE — Telephone Encounter (Signed)
Noted. Pt is going to pick up some Imodium today since she is headed to the store. Pt also has the Bentyle on hand if needed. She will call back if the diarrhea worsens and she wants to change medication.

## 2019-02-07 NOTE — Telephone Encounter (Signed)
If she's satisfied with twice a day we can leave well enough alone.  She can try Imodium OTC if she wants to try and cut that back.  Otherwise there are other Rx options.  Let me know if any questions or problems.

## 2019-02-09 DIAGNOSIS — Z0000 Encounter for general adult medical examination without abnormal findings: Secondary | ICD-10-CM | POA: Diagnosis not present

## 2019-02-17 DIAGNOSIS — E782 Mixed hyperlipidemia: Secondary | ICD-10-CM | POA: Diagnosis not present

## 2019-02-17 DIAGNOSIS — K219 Gastro-esophageal reflux disease without esophagitis: Secondary | ICD-10-CM | POA: Diagnosis not present

## 2019-02-17 DIAGNOSIS — E1165 Type 2 diabetes mellitus with hyperglycemia: Secondary | ICD-10-CM | POA: Diagnosis not present

## 2019-02-17 DIAGNOSIS — I10 Essential (primary) hypertension: Secondary | ICD-10-CM | POA: Diagnosis not present

## 2019-03-21 ENCOUNTER — Other Ambulatory Visit (HOSPITAL_COMMUNITY): Payer: Self-pay | Admitting: Internal Medicine

## 2019-03-21 DIAGNOSIS — Z1231 Encounter for screening mammogram for malignant neoplasm of breast: Secondary | ICD-10-CM

## 2019-03-22 DIAGNOSIS — I10 Essential (primary) hypertension: Secondary | ICD-10-CM | POA: Diagnosis not present

## 2019-03-22 DIAGNOSIS — K219 Gastro-esophageal reflux disease without esophagitis: Secondary | ICD-10-CM | POA: Diagnosis not present

## 2019-03-22 DIAGNOSIS — E782 Mixed hyperlipidemia: Secondary | ICD-10-CM | POA: Diagnosis not present

## 2019-03-22 DIAGNOSIS — E1165 Type 2 diabetes mellitus with hyperglycemia: Secondary | ICD-10-CM | POA: Diagnosis not present

## 2019-04-17 DIAGNOSIS — E1165 Type 2 diabetes mellitus with hyperglycemia: Secondary | ICD-10-CM | POA: Diagnosis not present

## 2019-04-17 DIAGNOSIS — E782 Mixed hyperlipidemia: Secondary | ICD-10-CM | POA: Diagnosis not present

## 2019-04-17 DIAGNOSIS — I10 Essential (primary) hypertension: Secondary | ICD-10-CM | POA: Diagnosis not present

## 2019-04-17 DIAGNOSIS — K219 Gastro-esophageal reflux disease without esophagitis: Secondary | ICD-10-CM | POA: Diagnosis not present

## 2019-05-23 ENCOUNTER — Encounter: Payer: Self-pay | Admitting: Nurse Practitioner

## 2019-05-23 ENCOUNTER — Ambulatory Visit: Payer: Medicare HMO | Admitting: Nurse Practitioner

## 2019-05-23 ENCOUNTER — Encounter: Payer: Self-pay | Admitting: *Deleted

## 2019-05-23 ENCOUNTER — Other Ambulatory Visit: Payer: Self-pay

## 2019-05-23 VITALS — BP 150/66 | HR 55 | Temp 96.6°F | Ht 62.0 in | Wt 269.8 lb

## 2019-05-23 DIAGNOSIS — R160 Hepatomegaly, not elsewhere classified: Secondary | ICD-10-CM

## 2019-05-23 DIAGNOSIS — K529 Noninfective gastroenteritis and colitis, unspecified: Secondary | ICD-10-CM | POA: Diagnosis not present

## 2019-05-23 DIAGNOSIS — K219 Gastro-esophageal reflux disease without esophagitis: Secondary | ICD-10-CM | POA: Diagnosis not present

## 2019-05-23 NOTE — Progress Notes (Signed)
Referring Provider: Celene Squibb, MD Primary Care Physician:  Celene Squibb, MD Primary GI:  Dr. Gala Romney  Chief Complaint  Patient presents with  . Gastroesophageal Reflux  . Diarrhea    HPI:   Ebony Stevens is a 68 y.o. female who presents for follow-up on GERD.  Patient was last seen in our office 01/18/2019 for GERD, chronic diarrhea, hepatomegaly.  Her last visit was a virtual office visit due to COVID-19/coronavirus pandemic it was a post procedure follow-up.  See having diarrhea ongoing for 10 years with fecal incontinence with sneezing and 4-5 stools a day which tends to start solid/formed and become more loose.  Sometimes has a nocturnal bowel movement and metformin has already been decreased from 1000 mg to 500 mg which helped.  His cholecystectomy "took some of my liver at the same time" prior polyps he followed up with the [place].  She stopped going because they were not doing anything for her (per the patient).  States she had a liver mass that previously only showed up on MRI.  Chronic right upper quadrant pain.  CT of the abdomen and pelvis in May 2019 with moderate hepatic steatosis and hepatomegaly which was deemed likely evolution of remote insult possibly related to prior cholecystectomy.  Previous appointment 11/10/2018 which found diverticula in the sigmoid and descending colon and a semi-pedunculated polyp found to be hyperplastic.  Random colon biopsies without histopathological changes.  Recommended repeat in 10 years.  See office note dated 01/18/2023 outline of records from [facility].  Essentially the patient had abnormal spots on her liver MRI with a gallium scan follow-up found 1 to be possibly metastasis versus hepatocellular carcinoma and second lesion not identified on gallium scan.  Generally felt to be increased fat in the liver with plans to follow closely with MRI to ensure stability (this was in 2001).  ERCP due to abnormal LFTs and liver lesion showed atrophic segments 2 and  3 of the liver with ductal proliferation.  Status post cholecystectomy in 2001 with abnormal appearing fatty/nodular liver status post biopsy and some question as to possible toxic effect.  Biopsies of the left and right lobe showed different levels of disease activity.  We attempted to get the operative report and biopsy results but they were prior to 2007 at [facility] and records cannot be found.  At her last visit Pepcid AC was helping her GERD symptoms.  Still with diarrhea 3-4 times a day that often has a normal/soft/small bowel movement in the morning.  Some lower abdominal pain not associated with urgent need for a bowel movement and typically resolves on its own, occasionally stops after a bowel movement.  Although her metformin dose was previously decreased it was increased again due to increasing hemoglobin A1c.  GERD acceptable to her at that time.  No other GI complaints.  Recommended Bentyl 10 mg in the morning to see if this helps and repeat dose up to 3 times a day total, continue Pepcid AC, follow-up liver labs at appointment, follow-up in 4 months.  Patient called our office about 3 weeks after her last visit noting that diarrhea worsened on Bentyl.  Off Bentyl having loose stools twice a day.  Recommended she consider Imodium if 2 stools a day or 2 months.  She agreed to pick up and trial Imodium, call back for any further problems.  No further communication from the patient.  Today she states she's doing ok overall. Not taking  Bentyl currently (see above, caused worse diarrhea for her).  She is taking an OTC anti-diarrhea medication, but doesn't know what it is; it's "for diarrhea and gas." She doesn't take it often because if she does she will get constipated. On average she is taking it about one to two times a month. Currently having diarrhea after every meal, stating "I don't have as much diarrhea." If she has a bowel movement before eating, her stools are more  normal (indicates [place]). Has not tried taking antidiarrhea with meals to prevent post-prandial loose stools. Has lower abdominal pain described as campy, sharp, and dull; typically if she has to go to the bathroom, typically resolves with a bowel movement. Has had a cholecystectomy in 2000. States when they took that out she was told she has non-alcoholic liver cirrhosis. GERD is doing ok, has only needed Pepcid a couple times in the past month; she has only taken it when waking in the middle of the night with reflux. Last meal typically 6 hours before bed. Denies N/V, fever, chills, unintentional weight loss, hematochezia, melena. Denies URI or flu-like symptoms. Denies loss of sense of taste or smell. Denies chest pain, dyspnea, dizziness, lightheadedness, syncope, near syncope. Denies any other upper or lower GI symptoms.  Past Medical History:  Diagnosis Date  . Asthma   . Chest pain 07/11/2015  . Diabetes mellitus without complication ([place])   . Dyspnea 07/11/2015  . Excessive bleeding in the premenopausal period   . Fibromyalgia   . GERD (gastroesophageal reflux disease)   . HTN (hypertension)   . Hypertension   . Localized edema   . Mixed hyperlipidemia   . Type 2 diabetes mellitus ([place])     Past Surgical History:  Procedure Laterality Date  . ABDOMINAL HYSTERECTOMY     1975 partial hysterectomy,  1978 "complete hysterctomy and retack bladder". states it didn't take.   Marland Kitchen BIOPSY  11/10/2018   Procedure: BIOPSY;  Surgeon: Daneil Dolin, MD;  Location: [facility];  Service: Endoscopy;;  colon   . CHOLECYSTECTOMY    . COLONOSCOPY WITH PROPOFOL N/A 11/10/2018   Procedure: COLONOSCOPY WITH PROPOFOL;  Surgeon: Daneil Dolin, MD;  Location: [facility];  Service: Endoscopy;  Laterality: N/A;  8:45am  . POLYPECTOMY  11/10/2018   Procedure: POLYPECTOMY;  Surgeon: Daneil Dolin, MD;  Location: [facility];  Service: Endoscopy;;  colon   . TONSILLECTOMY      Current  Outpatient Medications  Medication Sig Dispense Refill  . amLODipine (NORVASC) 5 MG tablet Take 5 mg by mouth daily.    Marland Kitchen aspirin EC 325 MG tablet Take 325 mg by mouth daily.    Marland Kitchen atorvastatin (LIPITOR) 80 MG tablet Take 80 mg by mouth daily.  5  . cetirizine (ZYRTEC) 10 MG tablet Take 10 mg by mouth daily.    Marland Kitchen estradiol (ESTRACE) 1 MG tablet Take 1 tablet by mouth at bedtime.    . furosemide (LASIX) 40 MG tablet TAKE ONE TABLET BY MOUTH EVERY OTHER DAY (Patient taking differently: Take 40 mg by mouth daily. ) 15 tablet 2  . glipiZIDE (GLUCOTROL) 10 MG tablet Take 10 mg by mouth 2 (two) times daily before a meal.    . insulin NPH Human (HUMULIN N,NOVOLIN N) 100 UNIT/ML injection Inject 10 Units into the skin 3 (three) times daily. Before Meals (states only takes if eats a big meal)    . insulin NPH-regular Human (NOVOLIN 70/30 RELION) (70-30) 100 UNIT/ML  injection Inject 60 Units into the skin at bedtime.     Marland Kitchen lisinopril (PRINIVIL,ZESTRIL) 40 MG tablet Take 1 tablet by mouth daily.    . Loperamide HCl (ANTI-DIARRHEAL PO) Take by mouth as needed.    . metFORMIN (GLUCOPHAGE) 1000 MG tablet Take 1,000 mg by mouth 2 (two) times daily with a meal.    . metoprolol succinate (TOPROL-XL) 100 MG 24 hr tablet Take 100 mg by mouth daily. Take with or immediately following a meal.    . potassium chloride (K-DUR) 10 MEQ tablet TAKE ONE TABLET BY MOUTH EVERY OTHER DAY (Patient taking differently: Take 10 mEq by mouth daily. ) 15 tablet 2  . dicyclomine (BENTYL) 10 MG capsule Take 1 capsule (10 mg total) by mouth 3 (three) times daily as needed (for diarrhea or abdominal pain). (Patient not taking: Reported on 05/23/2019) 30 capsule 3  . estradiol (ESTRACE) 2 MG tablet Take 2 mg by mouth daily.     No current facility-administered medications for this visit.     Allergies as of 05/23/2019  . (No Known Allergies)    Family History  Problem Relation Age of Onset  . Cancer Sister        ovarian  .  Diabetes Brother   . Colon cancer Neg Hx     Social History   Socioeconomic History  . Marital status: Married    Spouse name: Not on file  . Number of children: Not on file  . Years of education: Not on file  . Highest education level: Not on file  Occupational History  . Not on file  Social Needs  . Financial resource strain: Not on file  . Food insecurity    Worry: Not on file    Inability: Not on file  . Transportation needs    Medical: Not on file    Non-medical: Not on file  Tobacco Use  . Smoking status: Never Smoker  . Smokeless tobacco: Never Used  Substance and Sexual Activity  . Alcohol use: No  . Drug use: No  . Sexual activity: Not Currently  Lifestyle  . Physical activity    Days per week: Not on file    Minutes per session: Not on file  . Stress: Not on file  Relationships  . Social Herbalist on phone: Not on file    Gets together: Not on file    Attends religious service: Not on file    Active member of club or organization: Not on file    Attends meetings of clubs or organizations: Not on file    Relationship status: Not on file  Other Topics Concern  . Not on file  Social History Narrative   ** Merged History Encounter **        Review of Systems: General: Negative for anorexia, weight loss, fever, chills, fatigue, weakness. ENT: Negative for hoarseness, difficulty swallowing. CV: Negative for chest pain, angina, palpitations, peripheral edema.  Respiratory: Negative for dyspnea at rest, cough, sputum, wheezing.  GI: See history of present illness. Endo: Negative for unusual weight change.  Heme: Negative for bruising or bleeding. Allergy: Negative for rash or hives.   Physical Exam: BP (!) 150/66   Pulse (!) 55   Temp (!) 96.6 F (35.9 C) (Temporal)   Ht 5\' 2"  (1.575 m)   Wt 269 lb 12.8 oz (122.4 kg)   BMI 49.35 kg/m  General:   Alert and oriented. Pleasant and cooperative. Well-nourished and well-developed.  Eyes:   Without icterus, sclera clear and conjunctiva pink.  Ears:  Normal auditory acuity. Cardiovascular:  S1, S2 present without murmurs appreciated. Extremities without clubbing or edema. Respiratory:  Clear to auscultation bilaterally. No wheezes, rales, or rhonchi. No distress.  Gastrointestinal:  +BS, soft, non-tender and non-distended. No HSM noted. No guarding or rebound. No masses appreciated.  Rectal:  Deferred  Musculoskalatal:  Symmetrical without gross deformities. Neurologic:  Alert and oriented x4;  grossly normal neurologically. Psych:  Alert and cooperative. Normal mood and affect. Heme/Lymph/Immune: No excessive bruising noted.    05/23/2019 9:33 AM   Disclaimer: This note was dictated with voice recognition software. Similar sounding words can inadvertently be transcribed and may not be corrected upon review.

## 2019-05-23 NOTE — Patient Instructions (Addendum)
Your health issues we discussed today were:   GERD (reflux/heartburn): 1. Continue to take your Pepcid Complete as needed 2. Avoid foods that tend to make your symptoms worse 3. Avoid eating within 4 hours of laying down for bed 4. Call us if you have any severe worsening symptoms  Diarrhea: 1. As discussed you can try taking the diarrhea medicine with meals to see if this helps prevent diarrhea after meals 2. If you develop constipation, you can cut back on or stop the diarrhea medicine 3. Call for any severe worsening symptoms  Previously abnormal liver on imaging: 1. We will check a special ultrasound to look for significant scarring of your liver 2. Further recommendations will follow your ultrasound  Overall I recommend:  1. Continue your other current medications 2. Call us if you have any questions or concerns 3. Follow-up in 4 months.   Because of recent events of COVID-19 ("Coronavirus"), follow CDC recommendations:  Wash your hand frequently Avoid touching your face Stay away from people who are sick If you have symptoms such as fever, cough, shortness of breath then call your healthcare provider for further guidance If you are sick, STAY AT HOME unless otherwise directed by your healthcare provider. Follow directions from state and national officials regarding staying safe   At [facility] we value your feedback. You may receive a survey about your visit today. Please share your experience as we strive to create trusting relationships with our patients to provide genuine, compassionate, quality care.  We appreciate your understanding and patience as we review any laboratory studies, imaging, and other diagnostic tests that are ordered as we care for you. Our office policy is 5 business days for review of these results, and any emergent or urgent results are addressed in a timely manner for your best interest. If you do not hear from our office in 1 week,  please contact us.   We also encourage the use of MyChart, which contains your medical information for your review as well. If you are not enrolled in this feature, an access code is on this after visit summary for your convenience. Thank you for allowing [place] to be involved in your care.  It was great to see you today!  I hope you have a great summer!!

## 2019-05-23 NOTE — Assessment & Plan Note (Signed)
Please abnormalities of her liver including during surgery requiring 2 liver biopsies.  Surgical report and biopsies are not available to [place].  At one point the patient stated she had significant liver masses.  Today she states she was told that she had nonalcoholic cirrhosis.  CT imaging in our system within the past couple years shows more likely fatty liver disease.  In order to help clear up the picture on whether or not she has significant scarring of her liver we will plan for right upper quadrant ultrasound elastography.  Follow-up in 4 months.  Call for any worsening symptoms.  Further recommendations to follow.

## 2019-05-23 NOTE — Assessment & Plan Note (Signed)
GERD overall well managed.  Is only required Pepcid Complete about once a month.  Recommend she continue her GERD regimen and notify us of any worsening symptoms.

## 2019-05-23 NOTE — Assessment & Plan Note (Signed)
Chronic diarrhea.  She was previously recommended to take Bentyl but this made her diarrhea worse, per the patient.  She is now on an over-the-counter antidiarrhea/anti-gas medication, but cannot remember the name.  States she only takes it when her diarrhea is significant.  She does have postprandial loose stools.  I have recommended she try the antidiarrheal with meals to help prevent postprandial diarrhea but she is concerned about possible constipation.  She states she will try this and let us know if it works.  I informed her if she is comfortable with her stools as they are open she can opt to not take the medication unless it becomes severe.  If she does develop constipation while taking antidiarrheal with meals she can always stop.  Follow-up in 4 months.

## 2019-05-23 NOTE — Progress Notes (Signed)
cc'ed to pcp °

## 2019-05-30 ENCOUNTER — Ambulatory Visit (HOSPITAL_COMMUNITY)
Admission: RE | Admit: 2019-05-30 | Discharge: 2019-05-30 | Disposition: A | Discharge status: Home / Self Care | Payer: Medicare HMO | Source: Ambulatory Visit | Attending: Nurse Practitioner | Admitting: Nurse Practitioner

## 2019-05-30 ENCOUNTER — Other Ambulatory Visit: Payer: Self-pay

## 2019-05-30 DIAGNOSIS — K219 Gastro-esophageal reflux disease without esophagitis: Secondary | ICD-10-CM | POA: Insufficient documentation

## 2019-05-30 DIAGNOSIS — K760 Fatty (change of) liver, not elsewhere classified: Secondary | ICD-10-CM | POA: Diagnosis not present

## 2019-05-30 DIAGNOSIS — K529 Noninfective gastroenteritis and colitis, unspecified: Secondary | ICD-10-CM | POA: Diagnosis not present

## 2019-05-30 DIAGNOSIS — R160 Hepatomegaly, not elsewhere classified: Secondary | ICD-10-CM | POA: Diagnosis not present

## 2019-06-22 DIAGNOSIS — E1165 Type 2 diabetes mellitus with hyperglycemia: Secondary | ICD-10-CM | POA: Diagnosis not present

## 2019-06-22 DIAGNOSIS — K219 Gastro-esophageal reflux disease without esophagitis: Secondary | ICD-10-CM | POA: Diagnosis not present

## 2019-06-22 DIAGNOSIS — E782 Mixed hyperlipidemia: Secondary | ICD-10-CM | POA: Diagnosis not present

## 2019-06-22 DIAGNOSIS — I10 Essential (primary) hypertension: Secondary | ICD-10-CM | POA: Diagnosis not present

## 2019-07-24 DIAGNOSIS — G4733 Obstructive sleep apnea (adult) (pediatric): Secondary | ICD-10-CM | POA: Diagnosis not present

## 2019-07-27 DIAGNOSIS — G4733 Obstructive sleep apnea (adult) (pediatric): Secondary | ICD-10-CM | POA: Diagnosis not present

## 2019-08-25 DIAGNOSIS — N183 Chronic kidney disease, stage 3 unspecified: Secondary | ICD-10-CM | POA: Diagnosis not present

## 2019-08-25 DIAGNOSIS — E1122 Type 2 diabetes mellitus with diabetic chronic kidney disease: Secondary | ICD-10-CM | POA: Diagnosis not present

## 2019-08-25 DIAGNOSIS — E785 Hyperlipidemia, unspecified: Secondary | ICD-10-CM | POA: Diagnosis not present

## 2019-08-25 DIAGNOSIS — E1142 Type 2 diabetes mellitus with diabetic polyneuropathy: Secondary | ICD-10-CM | POA: Diagnosis not present

## 2019-08-25 DIAGNOSIS — E1165 Type 2 diabetes mellitus with hyperglycemia: Secondary | ICD-10-CM | POA: Diagnosis not present

## 2019-08-25 DIAGNOSIS — I10 Essential (primary) hypertension: Secondary | ICD-10-CM | POA: Diagnosis not present

## 2019-08-25 DIAGNOSIS — E782 Mixed hyperlipidemia: Secondary | ICD-10-CM | POA: Diagnosis not present

## 2019-08-25 DIAGNOSIS — G4733 Obstructive sleep apnea (adult) (pediatric): Secondary | ICD-10-CM | POA: Diagnosis not present

## 2019-08-27 DIAGNOSIS — G4733 Obstructive sleep apnea (adult) (pediatric): Secondary | ICD-10-CM | POA: Diagnosis not present

## 2019-08-31 DIAGNOSIS — E1165 Type 2 diabetes mellitus with hyperglycemia: Secondary | ICD-10-CM | POA: Diagnosis not present

## 2019-08-31 DIAGNOSIS — E782 Mixed hyperlipidemia: Secondary | ICD-10-CM | POA: Diagnosis not present

## 2019-08-31 DIAGNOSIS — K219 Gastro-esophageal reflux disease without esophagitis: Secondary | ICD-10-CM | POA: Diagnosis not present

## 2019-08-31 DIAGNOSIS — I10 Essential (primary) hypertension: Secondary | ICD-10-CM | POA: Diagnosis not present

## 2019-09-01 DIAGNOSIS — I10 Essential (primary) hypertension: Secondary | ICD-10-CM | POA: Diagnosis not present

## 2019-09-01 DIAGNOSIS — I129 Hypertensive chronic kidney disease with stage 1 through stage 4 chronic kidney disease, or unspecified chronic kidney disease: Secondary | ICD-10-CM | POA: Diagnosis not present

## 2019-09-01 DIAGNOSIS — E1122 Type 2 diabetes mellitus with diabetic chronic kidney disease: Secondary | ICD-10-CM | POA: Diagnosis not present

## 2019-09-01 DIAGNOSIS — I5032 Chronic diastolic (congestive) heart failure: Secondary | ICD-10-CM | POA: Diagnosis not present

## 2019-09-01 DIAGNOSIS — K219 Gastro-esophageal reflux disease without esophagitis: Secondary | ICD-10-CM | POA: Diagnosis not present

## 2019-09-01 DIAGNOSIS — E782 Mixed hyperlipidemia: Secondary | ICD-10-CM | POA: Diagnosis not present

## 2019-09-01 DIAGNOSIS — K760 Fatty (change of) liver, not elsewhere classified: Secondary | ICD-10-CM | POA: Diagnosis not present

## 2019-09-01 DIAGNOSIS — N1831 Chronic kidney disease, stage 3a: Secondary | ICD-10-CM | POA: Diagnosis not present

## 2019-09-01 DIAGNOSIS — E1165 Type 2 diabetes mellitus with hyperglycemia: Secondary | ICD-10-CM | POA: Diagnosis not present

## 2019-09-13 DIAGNOSIS — D447 Neoplasm of uncertain behavior of aortic body and other paraganglia: Secondary | ICD-10-CM | POA: Diagnosis not present

## 2019-09-13 DIAGNOSIS — L308 Other specified dermatitis: Secondary | ICD-10-CM | POA: Diagnosis not present

## 2019-09-13 DIAGNOSIS — L820 Inflamed seborrheic keratosis: Secondary | ICD-10-CM | POA: Diagnosis not present

## 2019-09-13 DIAGNOSIS — D235 Other benign neoplasm of skin of trunk: Secondary | ICD-10-CM | POA: Diagnosis not present

## 2019-09-13 DIAGNOSIS — L918 Other hypertrophic disorders of the skin: Secondary | ICD-10-CM | POA: Diagnosis not present

## 2019-09-15 DIAGNOSIS — E782 Mixed hyperlipidemia: Secondary | ICD-10-CM | POA: Diagnosis not present

## 2019-09-15 DIAGNOSIS — E1165 Type 2 diabetes mellitus with hyperglycemia: Secondary | ICD-10-CM | POA: Diagnosis not present

## 2019-09-15 DIAGNOSIS — K219 Gastro-esophageal reflux disease without esophagitis: Secondary | ICD-10-CM | POA: Diagnosis not present

## 2019-09-15 DIAGNOSIS — I10 Essential (primary) hypertension: Secondary | ICD-10-CM | POA: Diagnosis not present

## 2019-09-26 ENCOUNTER — Other Ambulatory Visit: Payer: Self-pay

## 2019-09-26 ENCOUNTER — Encounter: Payer: Self-pay | Admitting: Nurse Practitioner

## 2019-09-26 ENCOUNTER — Ambulatory Visit: Payer: Medicare HMO | Admitting: Nurse Practitioner

## 2019-09-26 VITALS — BP 143/57 | HR 61 | Temp 96.6°F | Ht 62.0 in | Wt 273.2 lb

## 2019-09-26 DIAGNOSIS — K529 Noninfective gastroenteritis and colitis, unspecified: Secondary | ICD-10-CM

## 2019-09-26 DIAGNOSIS — R1030 Lower abdominal pain, unspecified: Secondary | ICD-10-CM | POA: Diagnosis not present

## 2019-09-26 DIAGNOSIS — K7460 Unspecified cirrhosis of liver: Secondary | ICD-10-CM | POA: Insufficient documentation

## 2019-09-26 DIAGNOSIS — R109 Unspecified abdominal pain: Secondary | ICD-10-CM | POA: Insufficient documentation

## 2019-09-26 DIAGNOSIS — G4733 Obstructive sleep apnea (adult) (pediatric): Secondary | ICD-10-CM | POA: Diagnosis not present

## 2019-09-26 MED ORDER — CHOLESTYRAMINE 4 G PO PACK: 4.0000 g | PACK | Freq: Two times a day (BID) | ORAL | 3 refills | Status: DC

## 2019-09-26 NOTE — Progress Notes (Signed)
Referring Provider: Celene Squibb, MD Primary Care Physician:  Celene Squibb, MD Primary GI:  Dr. Gala Romney  Chief Complaint  Patient presents with  . Gastroesophageal Reflux  . Diarrhea    everyday; sometimes up to 6 times/day    HPI:   Ebony Stevens is a 68 y.o. female who presents for follow-up on GERD.  The patient was last seen in our office 05/23/2019 for the same as well as hepatomegaly and chronic diarrhea.  Noted chronic history of diarrhea for 10+ years with fecal incontinence upon sneezing and 4-5 stools a day which tend to start solid/formed and become progressively more loose.  Occasional nocturnal bowel movement.  Change in Metformin dose is helped.  Status post cholecystectomy.  Liver mass previously on MRI, per the patient.  CT of the abdomen and pelvis in May 2019 bowel moderate hepatic steatosis and hepatomegaly deemed likely due to evolution of remote insult possibly related to prior cholecystectomy.  Colonoscopy up-to-date with noted diverticula, semipedunculated polyp that was hyperplastic and normal random colon biopsies with recommended repeat in 10 years (2030).  See office note dated 01/18/2023 outline of records from [facility].  Essentially the patient had abnormal spots on her liver MRI with a gallium scan follow-up found 1 to be possibly metastasis versus hepatocellular carcinoma and second lesion not identified on gallium scan.  Generally felt to be increased fat in the liver with plans to follow closely with MRI to ensure stability (this was in 2001).  ERCP due to abnormal LFTs and liver lesion showed atrophic segments 2 and 3 of the liver with ductal proliferation.  Status post cholecystectomy in 2001 with abnormal appearing fatty/nodular liver status post biopsy and some question as to possible toxic effect.  Biopsies of the left and right lobe showed different levels of disease activity.  We attempted to get the operative report and biopsy results but they were prior to 2007  at [facility] and records cannot be found.  At her last visit she stopped Bentyl due to worsening diarrhea.  Using over-the-counter antidiarrheal which she does not use often because it occasionally causes constipation.  Typically takes it about 1 or 2 times a month.  Diarrhea after every meal but decreased in volume.  Some solidification if she does not have a bowel movement prior to eating.  Lower abdominal pain typically crampy, sharp, dull when she needs to have a bowel movement and it generally resolves with a bowel movement.  States she was previously told she had nonalcoholic liver cirrhosis.  GERD doing okay, rare Pepcid needed.  No other overt GI complaints.  Recommended continue Pepcid as needed, trigger avoidance, try antidiarrheal with meals to see if that helps prevent diarrhea, cut back or stop diarrhea medicine if constipation develops.  Recommended ultrasound elastography and follow-up in 4 months.  Right upper quadrant ultrasound elastography completed 05/30/2019 which found diffuse fatty infiltration of the liver with Matavir score of some F3/F4 and high risk of fibrosis.  Today she states she's doing ok overall. GERD symptoms improved, only on Pepcid AC prn, trying to avoid triggers but admits "I love spicy food, but I have cut way back on it." Still with diarrhea up to 6 stools a day, on average more like 3-4 daily; diarrhea every day. Typically has a normal stool in the morning, seems like she has straining and incomplete emptying; after that stools are runny. Intermittent abdominal pain lower, crampy that improves after a bowel movement.  Bentyl caused worse diarrhea/urgency. Using OTC antidiarrheal which she takes "anytime I have diarrhea and can't make it to the bathroom." Has some constipation if she takes it regularly. But will no improve diarrhea either? When she has constipation she'll have 2 stools a day with pain and incomplete emptying. Denies N/V,  hematochezia, melena, fever, chills, unintentional weight loss. She has had a colecystectomy in the past.   Last hgb a1c was "6-something" but not in our system.  Past Medical History:  Diagnosis Date  . Asthma   . Chest pain 07/11/2015  . Diabetes mellitus without complication ([place])   . Dyspnea 07/11/2015  . Excessive bleeding in the premenopausal period   . Fibromyalgia   . GERD (gastroesophageal reflux disease)   . HTN (hypertension)   . Hypertension   . Localized edema   . Mixed hyperlipidemia   . Type 2 diabetes mellitus ([place])     Past Surgical History:  Procedure Laterality Date  . ABDOMINAL HYSTERECTOMY     1975 partial hysterectomy,  1978 "complete hysterctomy and retack bladder". states it didn't take.   Marland Kitchen BIOPSY  11/10/2018   Procedure: BIOPSY;  Surgeon: Daneil Dolin, MD;  [facility]: [facility];  Service: Endoscopy;;  colon   . CHOLECYSTECTOMY    . COLONOSCOPY WITH PROPOFOL N/A 11/10/2018   Procedure: COLONOSCOPY WITH PROPOFOL;  Surgeon: Daneil Dolin, MD;  Location: [facility];  Service: Endoscopy;  Laterality: N/A;  8:45am  . POLYPECTOMY  11/10/2018   Procedure: POLYPECTOMY;  Surgeon: Daneil Dolin, MD;  Location: [facility];  Service: Endoscopy;;  colon   . TONSILLECTOMY      Current Outpatient Medications  Medication Sig Dispense Refill  . acetaminophen (TYLENOL) 500 MG tablet Take 1,000 mg by mouth every 6 (six) hours as needed.    Marland Kitchen amLODipine (NORVASC) 5 MG tablet Take 5 mg by mouth daily.    Marland Kitchen aspirin EC 325 MG tablet Take 325 mg by mouth daily.    Marland Kitchen atorvastatin (LIPITOR) 80 MG tablet Take 80 mg by mouth daily.  5  . cetirizine (ZYRTEC) 10 MG tablet Take 10 mg by mouth daily.    Marland Kitchen estradiol (ESTRACE) 1 MG tablet Take 1 tablet by mouth at bedtime.    . Famotidine (PEPCID AC PO) Take by mouth as needed.    . furosemide (LASIX) 40 MG tablet TAKE ONE TABLET BY MOUTH EVERY OTHER DAY (Patient taking differently: Take 40 mg by mouth daily. ) 15  tablet 2  . glipiZIDE (GLUCOTROL) 10 MG tablet Take 10 mg by mouth 2 (two) times daily before a meal.    . ibuprofen (ADVIL) 200 MG tablet Take 200 mg by mouth every 6 (six) hours as needed.    . insulin NPH Human (HUMULIN N,NOVOLIN N) 100 UNIT/ML injection Inject 10 Units into the skin 3 (three) times daily. Before Meals (states only takes if eats a big meal)    . insulin NPH-regular Human (NOVOLIN 70/30 RELION) (70-30) 100 UNIT/ML injection Inject 60 Units into the skin at bedtime.     Marland Kitchen lisinopril (PRINIVIL,ZESTRIL) 40 MG tablet Take 1 tablet by mouth daily.    . metFORMIN (GLUCOPHAGE) 1000 MG tablet Take 1,000 mg by mouth 2 (two) times daily with a meal.    . metoprolol succinate (TOPROL-XL) 100 MG 24 hr tablet Take 100 mg by mouth daily. Take with or immediately following a meal.    . potassium chloride (K-DUR) 10 MEQ tablet TAKE ONE TABLET  BY MOUTH EVERY OTHER DAY (Patient taking differently: Take 10 mEq by mouth daily. ) 15 tablet 2  . Loperamide HCl (ANTI-DIARRHEAL PO) Take by mouth as needed.     No current facility-administered medications for this visit.    Allergies as of 09/26/2019  . (No Known Allergies)    Family History  Problem Relation Age of Onset  . Cancer Sister        ovarian  . Diabetes Brother   . Colon cancer Neg Hx     Social History   Socioeconomic History  . Marital status: Married    Spouse name: Not on file  . Number of children: Not on file  . Years of education: Not on file  . Highest education level: Not on file  Occupational History  . Not on file  Tobacco Use  . Smoking status: Never Smoker  . Smokeless tobacco: Never Used  Substance and Sexual Activity  . Alcohol use: No  . Drug use: No  . Sexual activity: Not Currently  Other Topics Concern  . Not on file  Social History Narrative   ** Merged History Encounter **       Social Determinants of Health   Financial Resource Strain:   . Difficulty of Paying Living Expenses: Not on  file  Food Insecurity:   . Worried About Charity fundraiser in the Last Year: Not on file  . Ran Out of Food in the Last Year: Not on file  Transportation Needs:   . Lack of Transportation (Medical): Not on file  . Lack of Transportation (Non-Medical): Not on file  Physical Activity:   . Days of Exercise per Week: Not on file  . Minutes of Exercise per Session: Not on file  Stress:   . Feeling of Stress : Not on file  Social Connections:   . Frequency of Communication with Friends and Family: Not on file  . Frequency of Social Gatherings with Friends and Family: Not on file  . Attends Religious Services: Not on file  . Active Member of Clubs or Organizations: Not on file  . Attends Archivist Meetings: Not on file  . Marital Status: Not on file    Review of Systems: General: Negative for anorexia, weight loss, fever, chills, fatigue, weakness. ENT: Negative for hoarseness, difficulty swallowing CV: Negative for chest pain, angina, palpitations, peripheral edema.  Respiratory: Negative for dyspnea at rest, cough, sputum, wheezing.  GI: See history of present illness. Endo: Negative for unusual weight change.  Heme: Negative for bruising or bleeding. Allergy: Negative for rash or hives.   Physical Exam: BP (!) 143/57   Pulse 61   Temp (!) 96.6 F (35.9 C) (Temporal)   Ht 5\' 2"  (1.575 m)   Wt 273 lb 3.2 oz (123.9 kg)   BMI 49.97 kg/m  General:   Alert and oriented. Pleasant and cooperative. Well-nourished and well-developed.  Eyes:  Without icterus, sclera clear and conjunctiva pink.  Ears:  Normal auditory acuity. Cardiovascular:  S1, S2 present without murmurs appreciated. Extremities without clubbing or edema. Respiratory:  Clear to auscultation bilaterally. No wheezes, rales, or rhonchi. No distress.  Gastrointestinal:  +BS, soft, non-tender and non-distended. No HSM noted. No guarding or rebound. No masses appreciated.  Rectal:  Deferred  Musculoskalatal:   Symmetrical without gross deformities. Neurologic:  Alert and oriented x4;  grossly normal neurologically. Psych:  Alert and cooperative. Normal mood and affect. Heme/Lymph/Immune: No excessive bruising noted.    09/26/2019  10:51 AM   Disclaimer: This note was dictated with voice recognition software. Similar sounding words can inadvertently be transcribed and may not be corrected upon review.

## 2019-09-26 NOTE — Patient Instructions (Signed)
Your health issues we discussed today were:   Liver scarring/possible early cirrhosis: 1. Have your labs checked as soon as you can which will help [place] better determine the state of your liver function (which I suspect is doing just fine) as well as possible causes of the scarring 2. We will call you when we get results  Chronic diarrhea: 1. I have sent a prescription to your pharmacy for Questran (cholestyramine) 2. Take this twice a day 3. Call us in 1 to 2 weeks and let us know if it is helping your diarrhea 4. This medication typically helps with diarrhea due to bile issues after having a gallbladder removal 5. Call us for any worsening or severe symptoms 6. If your symptoms worsen or if Questran does not help, there are other options we can try 7. If you have an appointment that she have to go to you can take an over-the-counter antidiarrheal to help prevent diarrhea  Overall I recommend:  1. Continue your other current medications 2. Return for follow-up in 2 months 3. Call us if you have any questions or concerns 4. We will try to get you a new code see you can register for my chart   Because of recent events of COVID-19 ("Coronavirus"), follow CDC recommendations:  1. Wash your hand frequently 2. Avoid touching your face 3. Stay away from people who are sick 4. If you have symptoms such as fever, cough, shortness of breath then call your healthcare provider for further guidance 5. If you are sick, STAY AT HOME unless otherwise directed by your healthcare provider. 6. Follow directions from state and national officials regarding staying safe   At [facility] we value your feedback. You may receive a survey about your visit today. Please share your experience as we strive to create trusting relationships with our patients to provide genuine, compassionate, quality care.  We appreciate your understanding and patience as we review any laboratory studies, imaging,  and other diagnostic tests that are ordered as we care for you. Our office policy is 5 business days for review of these results, and any emergent or urgent results are addressed in a timely manner for your best interest. If you do not hear from our office in 1 week, please contact us.   We also encourage the use of MyChart, which contains your medical information for your review as well. If you are not enrolled in this feature, an access code is on this after visit summary for your convenience. Thank you for allowing [place] to be involved in your care.  It was great to see you today!  I hope you have a Merry Christmas and Happy Holidays!!

## 2019-09-27 NOTE — Assessment & Plan Note (Signed)
Previously suggested fibrosis was followed up with liver elastography and found some F3/F4 and high risk of fibrosis.  Likely early cirrhosis.  Previous concerns for liver mass at [facility] was further evaluated and found to be likely increased fat in the liver.  Unsure of the exact cause of her fibrosis/early cirrhosis.  At this time I will check viral hepatitis labs, CBC, CMP, ferritin, ceruloplasmin.  Depending on results we may need to check autoimmune serologies as well.  Most recent CMP in December 2019 was essentially normal for all her prior liver enzymes.  Continue to follow.  She may eventually need a liver biopsy depending on results of serology.  Follow-up in 2 months.

## 2019-09-27 NOTE — Assessment & Plan Note (Signed)
Patient has intermittent lower crampy abdominal pain that generally improves after a bowel movement.  She does have loose stools.  Likely IBS symptoms.  Diarrhea and urgency worsened with Bentyl.  She is using over-the-counter antidiarrheal which she takes to prevent urgency.  Her story is a bit inconsistent and is difficult to discern whether or not it helps.  At some point seems it does, but at 1 point mentioned possibly no improvement.  However, she finally said when she has constipation child 2 stools a day with pain and incomplete emptying.  Overall over-the-counter antidiarrhea helps but taking too much may cause constipation.  Further recommendations for diarrhea as per above.  Follow-up in 2 months.

## 2019-09-27 NOTE — Assessment & Plan Note (Signed)
Some ongoing diarrhea, typically in the morning.  She will generally have a normal stool followed by up to 6 runny stools, although she typically averages 3 to 4-day.  She does have diarrhea every day.  She is status post cholecystectomy.  She is a diabetic but her hemoglobin seems to be generally well controlled in the 6 range (per the patient).  At this point Bentyl caused worsening diarrhea.  Over-the-counter antidiarrheals may not be significantly effective.  I will trial her on cholestyramine for possible bile salt diarrhea.  Request progress report in 1 to 2 weeks with further recommendations to follow.  Follow-up in our office in 2 months regardless.

## 2019-10-03 DIAGNOSIS — R1030 Lower abdominal pain, unspecified: Secondary | ICD-10-CM | POA: Diagnosis not present

## 2019-10-03 DIAGNOSIS — K7460 Unspecified cirrhosis of liver: Secondary | ICD-10-CM | POA: Diagnosis not present

## 2019-10-03 DIAGNOSIS — K529 Noninfective gastroenteritis and colitis, unspecified: Secondary | ICD-10-CM | POA: Diagnosis not present

## 2019-10-04 ENCOUNTER — Other Ambulatory Visit: Payer: Self-pay | Admitting: Nurse Practitioner

## 2019-10-04 DIAGNOSIS — K7460 Unspecified cirrhosis of liver: Secondary | ICD-10-CM

## 2019-10-04 LAB — COMPREHENSIVE METABOLIC PANEL
AG Ratio: 1.7 (calc) (ref 1.0–2.5)
ALT: 40 U/L — ABNORMAL HIGH (ref 6–29)
AST: 29 U/L (ref 10–35)
Albumin: 4 g/dL (ref 3.6–5.1)
Alkaline phosphatase (APISO): 101 U/L (ref 37–153)
BUN: 20 mg/dL (ref 7–25)
CO2: 22 mmol/L (ref 20–32)
Calcium: 8.7 mg/dL (ref 8.6–10.4)
Chloride: 107 mmol/L (ref 98–110)
Creat: 0.9 mg/dL (ref 0.50–0.99)
Globulin: 2.4 g/dL (calc) (ref 1.9–3.7)
Glucose, Bld: 173 mg/dL — ABNORMAL HIGH (ref 65–139)
Potassium: 4.4 mmol/L (ref 3.5–5.3)
Sodium: 139 mmol/L (ref 135–146)
Total Bilirubin: 0.6 mg/dL (ref 0.2–1.2)
Total Protein: 6.4 g/dL (ref 6.1–8.1)

## 2019-10-04 LAB — CBC WITH DIFFERENTIAL/PLATELET
Absolute Monocytes: 280 cells/uL (ref 200–950)
Basophils Absolute: 40 cells/uL (ref 0–200)
Basophils Relative: 0.8 %
Eosinophils Absolute: 70 cells/uL (ref 15–500)
Eosinophils Relative: 1.4 %
HCT: 37.9 % (ref 35.0–45.0)
Hemoglobin: 12.5 g/dL (ref 11.7–15.5)
Lymphs Abs: 1530 cells/uL (ref 850–3900)
MCH: 29.6 pg (ref 27.0–33.0)
MCHC: 33 g/dL (ref 32.0–36.0)
MCV: 89.6 fL (ref 80.0–100.0)
MPV: 11.4 fL (ref 7.5–12.5)
Monocytes Relative: 5.6 %
Neutro Abs: 3080 cells/uL (ref 1500–7800)
Neutrophils Relative %: 61.6 %
Platelets: 162 10*3/uL (ref 140–400)
RBC: 4.23 10*6/uL (ref 3.80–5.10)
RDW: 12.8 % (ref 11.0–15.0)
Total Lymphocyte: 30.6 %
WBC: 5 10*3/uL (ref 3.8–10.8)

## 2019-10-04 LAB — FERRITIN: Ferritin: 15 ng/mL — ABNORMAL LOW (ref 16–288)

## 2019-10-04 LAB — HEPATITIS B CORE ANTIBODY, TOTAL: Hep B Core Total Ab: NONREACTIVE

## 2019-10-04 LAB — HEPATITIS B SURFACE ANTIBODY,QUALITATIVE: Hep B S Ab: NONREACTIVE

## 2019-10-04 LAB — CERULOPLASMIN: Ceruloplasmin: 32 mg/dL (ref 18–53)

## 2019-10-04 LAB — HEPATITIS C ANTIBODY
Hepatitis C Ab: NONREACTIVE
SIGNAL TO CUT-OFF: 0.02 (ref <1.00)

## 2019-10-04 LAB — HEPATITIS B SURFACE ANTIGEN: Hepatitis B Surface Ag: NONREACTIVE

## 2019-10-09 ENCOUNTER — Telehealth: Payer: Self-pay | Admitting: Internal Medicine

## 2019-10-09 NOTE — Telephone Encounter (Signed)
Pt was returning call. [phone]

## 2019-10-10 NOTE — Telephone Encounter (Signed)
Pt was given results. See result note.  °

## 2019-10-16 ENCOUNTER — Ambulatory Visit (INDEPENDENT_AMBULATORY_CARE_PROVIDER_SITE_OTHER): Payer: Self-pay | Admitting: Nurse Practitioner

## 2019-10-16 DIAGNOSIS — K7460 Unspecified cirrhosis of liver: Secondary | ICD-10-CM | POA: Diagnosis not present

## 2019-10-16 DIAGNOSIS — R197 Diarrhea, unspecified: Secondary | ICD-10-CM

## 2019-10-16 DIAGNOSIS — K625 Hemorrhage of anus and rectum: Secondary | ICD-10-CM

## 2019-10-18 ENCOUNTER — Telehealth: Payer: Self-pay

## 2019-10-18 ENCOUNTER — Other Ambulatory Visit: Payer: Self-pay

## 2019-10-18 DIAGNOSIS — K625 Hemorrhage of anus and rectum: Secondary | ICD-10-CM

## 2019-10-18 DIAGNOSIS — R197 Diarrhea, unspecified: Secondary | ICD-10-CM

## 2019-10-18 LAB — IFOBT (OCCULT BLOOD): IFOBT: NEGATIVE

## 2019-10-18 NOTE — Telephone Encounter (Signed)
IFOBT completed on 10/16/2019, results are negative for IFOBT testing.

## 2019-10-19 NOTE — Telephone Encounter (Signed)
Noted  

## 2019-10-20 LAB — ANTI-SMOOTH MUSCLE ANTIBODY, IGG: Actin (Smooth Muscle) Antibody (IGG): <20 U (ref <20)

## 2019-10-20 LAB — ANA: Anti Nuclear Antibody (ANA): NEGATIVE

## 2019-10-20 LAB — MITOCHONDRIAL ANTIBODIES: Mitochondrial M2 Ab, IgG: <=20 U

## 2019-10-20 LAB — IRON: Iron: 85 ug/dL (ref 45–160)

## 2019-10-27 DIAGNOSIS — G4733 Obstructive sleep apnea (adult) (pediatric): Secondary | ICD-10-CM | POA: Diagnosis not present

## 2019-11-27 DIAGNOSIS — G4733 Obstructive sleep apnea (adult) (pediatric): Secondary | ICD-10-CM | POA: Diagnosis not present

## 2019-11-28 ENCOUNTER — Ambulatory Visit: Payer: Medicare HMO | Admitting: Nurse Practitioner

## 2019-11-28 DIAGNOSIS — G4733 Obstructive sleep apnea (adult) (pediatric): Secondary | ICD-10-CM | POA: Diagnosis not present

## 2019-12-02 ENCOUNTER — Ambulatory Visit: Payer: Medicare HMO | Attending: Internal Medicine

## 2019-12-02 DIAGNOSIS — Z23 Encounter for immunization: Secondary | ICD-10-CM | POA: Insufficient documentation

## 2019-12-02 NOTE — Progress Notes (Signed)
   Covid-19 Vaccination Clinic  Name:  Ebony Stevens    MRN: [id number] DOB: [birth year]  12/02/2019  Ebony Stevens was observed post Covid-19 immunization for 15 minutes without incidence. She was provided with Vaccine Information Sheet and instruction to access the V-Safe system.   Ebony Stevens was instructed to call 911 with any severe reactions post vaccine: Marland Kitchen Difficulty breathing  . Swelling of your face and throat  . A fast heartbeat  . A bad rash all over your body  . Dizziness and weakness    Immunizations Administered    Name Date Dose VIS Date Route   Pfizer COVID-19 Vaccine 12/02/2019  8:13 AM 0.3 mL 09/22/2019 Intramuscular   Manufacturer: Pony   Lot: [id number]   [place]: [id number]

## 2019-12-08 DIAGNOSIS — K219 Gastro-esophageal reflux disease without esophagitis: Secondary | ICD-10-CM | POA: Diagnosis not present

## 2019-12-08 DIAGNOSIS — E1165 Type 2 diabetes mellitus with hyperglycemia: Secondary | ICD-10-CM | POA: Diagnosis not present

## 2019-12-08 DIAGNOSIS — I10 Essential (primary) hypertension: Secondary | ICD-10-CM | POA: Diagnosis not present

## 2019-12-08 DIAGNOSIS — E7849 Other hyperlipidemia: Secondary | ICD-10-CM | POA: Diagnosis not present

## 2019-12-08 DIAGNOSIS — E782 Mixed hyperlipidemia: Secondary | ICD-10-CM | POA: Diagnosis not present

## 2019-12-13 ENCOUNTER — Ambulatory Visit: Payer: Medicare HMO | Admitting: Nurse Practitioner

## 2019-12-19 DIAGNOSIS — K219 Gastro-esophageal reflux disease without esophagitis: Secondary | ICD-10-CM | POA: Diagnosis not present

## 2019-12-19 DIAGNOSIS — E1165 Type 2 diabetes mellitus with hyperglycemia: Secondary | ICD-10-CM | POA: Diagnosis not present

## 2019-12-19 DIAGNOSIS — E782 Mixed hyperlipidemia: Secondary | ICD-10-CM | POA: Diagnosis not present

## 2019-12-19 DIAGNOSIS — I10 Essential (primary) hypertension: Secondary | ICD-10-CM | POA: Diagnosis not present

## 2019-12-20 ENCOUNTER — Other Ambulatory Visit: Payer: Self-pay

## 2019-12-20 ENCOUNTER — Encounter: Payer: Self-pay | Admitting: Nurse Practitioner

## 2019-12-20 ENCOUNTER — Ambulatory Visit: Payer: Medicare HMO | Admitting: Nurse Practitioner

## 2019-12-20 VITALS — BP 136/62 | HR 45 | Temp 97.7°F | Ht 61.5 in | Wt 269.8 lb

## 2019-12-20 DIAGNOSIS — K219 Gastro-esophageal reflux disease without esophagitis: Secondary | ICD-10-CM | POA: Diagnosis not present

## 2019-12-20 DIAGNOSIS — K529 Noninfective gastroenteritis and colitis, unspecified: Secondary | ICD-10-CM | POA: Diagnosis not present

## 2019-12-20 DIAGNOSIS — R1030 Lower abdominal pain, unspecified: Secondary | ICD-10-CM | POA: Diagnosis not present

## 2019-12-20 NOTE — Assessment & Plan Note (Signed)
Significantly improved on Questran once daily.  If she takes it twice daily as previously prescribed she tends to have constipation.  She occasionally forgets to take a dose.  We discussed ways to try and remember to take her medication.  She is satisfied with the diarrhea resolved.  Follow-up in 3 months.

## 2019-12-20 NOTE — Assessment & Plan Note (Signed)
Persistent abdominal pain which is intermittent and typically starts when she needs to have a bowel movement.  Likely multifactorial given cirrhosis, multiple abdominal surgeries with likely significant scar tissue, probable component of visceral hypersensitivity.  Bentyl causes constipation and she cannot take it.  She does not feel that her pain is bad enough to need medication to help treat it.  We discussed when she starts having abdominal pain, try to have a bowel movement.  What she currently does is weight and eventually does have try to have a bowel movement which point she will have a bowel movement and her pain will improve.  I tried to get her to think of it as a trigger to have a movement which would likely result in improvement in pain.  Follow-up in 3 months.

## 2019-12-20 NOTE — Patient Instructions (Signed)
Your health issues we discussed today were:   Abdominal pain: 1. As we discussed, when you have your abdominal pain try to have a bowel movement see if you are able to which was subsequently, likely, improve your pain 2. Call us if any worsening or severe pain  Diarrhea: 1. I am glad Lucrezia Starch is helping your diarrhea 2. Continue to take this once a day to help prevent diarrhea 3. As we discussed, try leaving your medication next to your TV remote to remind yourself to take it in the evenings 4. Call us if you have any worsening or severe symptoms  GERD (reflux/heartburn): 1. Continue take Pepcid as needed 2. Call us if any worsening or severe symptoms  Overall I recommend:  1. Continue your other current medications 2. Return for follow-up in 3 months for these issues as well as to follow-up on your liver cirrhosis 3. Call us if you have any questions or concerns   ---------------------------------------------------------------  I am very happy that you got your first Covid vaccine!  Keep your appointment next week for your second dose.  Even after you are vaccinated, continue to wear a mask and socially distance from other people (at least 6 feet away) to help prevent Covid spread  ---------------------------------------------------------------   At [facility] we value your feedback. You may receive a survey about your visit today. Please share your experience as we strive to create trusting relationships with our patients to provide genuine, compassionate, quality care.  We appreciate your understanding and patience as we review any laboratory studies, imaging, and other diagnostic tests that are ordered as we care for you. Our office policy is 5 business days for review of these results, and any emergent or urgent results are addressed in a timely manner for your best interest. If you do not hear from our office in 1 week, please contact us.   We also encourage  the use of MyChart, which contains your medical information for your review as well. If you are not enrolled in this feature, an access code is on this after visit summary for your convenience. Thank you for allowing [place] to be involved in your care.  It was great to see you today!  I hope you have a great day!!

## 2019-12-20 NOTE — Progress Notes (Signed)
Referring Provider: Celene Squibb, MD Primary Care Physician:  Celene Squibb, MD Primary GI:  Dr. Gala Romney  Chief Complaint  Patient presents with  . Abdominal Pain    abd pain before bm's    HPI:   Ebony Stevens is a 69 y.o. female who presents for follow-up on abdominal pain and diarrhea.  The patient was last seen in our office 09/26/2019 for chronic diarrhea, cirrhosis, lower abdominal pain.  Noted chronic history of diarrhea for 10+ years with fecal incontinence upon sneezing and 4-5 stools a day which tend to be solid/formed at the onset become progressively more loose.  Occasional nocturnal bowel movement.  Change in Metformin dose has helped.  Status post cholecystectomy.  Previous liver mass on MRI per the patient and CT of the abdomen and pelvis in May 2019 found moderate hepatic steatosis and hepatomegaly deemed likely due to evolution of remote insult possibly related to prior cholecystectomy.  Colonoscopy up-to-date with recommended 10-year repeat (2030).  See office note dated 01/18/2023 outline of records from [facility]. Essentially the patient had abnormal spots on her liver MRI with a gallium scan follow-up found 1 to be possibly metastasis versus hepatocellular carcinoma and second lesion not identified on gallium scan. Generally felt to be increased fat in the liver with plans to follow closely with MRI to ensure stability (this was in 2001). ERCP due to abnormal LFTs and liver lesion showed atrophic segments 2 and 3 of the liver with ductal proliferation. Status postcholecystectomy in 2001 with abnormal appearing fatty/nodular liver status post biopsy and some question as to possible toxic effect. Biopsies of the left and right lobe showed different levels of disease activity. We attempted to get the operative report and biopsy results but they were prior to 2007 at [facility] and records cannot be found.  Previously attempted over-the-counter antidiarrheal  which caused constipation but will take it 1 or 2 times a month, stopped Bentyl due to worsening diarrhea.  At some point in the past she was told she has nonalcoholic liver cirrhosis.  GERD well managed on as needed Pepcid.  Right upper quadrant ultrasound elastography completed 05/30/2019 which found diffuse fatty infiltration of the liver with Matavir score of some F3/F4 and deemed high risk of fibrosis/cirrhosis.  At her last visit noted GERD symptoms improved only on Pepcid AC as needed, trying to avoid triggers but admits "all of spicy food, but I have cut way back on it."  Still with up to 6 stools a day which are loose and average more like 3-4 daily.  Normal stool in the morning that becomes progressively loose after straining for her initial stool.  Intermittent lower abdominal crampy pain that improves after bowel movement.  Bentyl caused worsening diarrhea/urgency.  Uses OTC antidiarrheal for diarrhea when she cannot make it to the bathroom, but this does cause some constipation wherein she will have 2 stools a day with pain and incomplete emptying.  Recommended updating labs related to cirrhosis.  Trial of Questran twice daily with progress report in 1 to 2 weeks, follow-up in 2 months.  No progress report called to our office.  Today she states she's doing ok overall. She has gotten her first COVID-19 vaccine, is scheduled for her second dose next Tuesday 12/26/19. She also got her first 2 vaccines for Hepatitis B, she is scheduled for her third dose in July. She tried Questran, which did help her diarrhea. If she does two doses in a  day she'll get constipated; once a day dose is more effective. If she misses a dose will have some loose stool. She still has abdominal discomfort about . She does have a history of partial hysterectomy, full hysterectomy, and exploratory laparotomy and knows she has significant abdominal pain. She has abdominal discomfort before she needs to go to the bathroom, but  after a bowel movement the pain resolves. States "sometinmes I'll have the discomfort for 30-60 minutes and think 'well maybe I need to go to the bathroom' and when I go it gets better." Pain is typically a 5/10 when it occurs. GERD still well managed on Pepcid prn. Denies hematochezia, melena, fever, chills, unintentional weight loss. Denies URI or flu-like symptoms. Denies loss of sense of taste or smell. Has not been tested for COVID-19. Denies chest pain, dyspnea, dizziness, lightheadedness, syncope, near syncope. Denies any other upper or lower GI symptoms.  Past Medical History:  Diagnosis Date  . Asthma   . Chest pain 07/11/2015  . Diabetes mellitus without complication ([place])   . Dyspnea 07/11/2015  . Excessive bleeding in the premenopausal period   . Fibromyalgia   . GERD (gastroesophageal reflux disease)   . HTN (hypertension)   . Hypertension   . Localized edema   . Mixed hyperlipidemia   . Type 2 diabetes mellitus ([place])     Past Surgical History:  Procedure Laterality Date  . ABDOMINAL HYSTERECTOMY     1975 partial hysterectomy,  1978 "complete hysterctomy and retack bladder". states it didn't take.   Marland Kitchen BIOPSY  11/10/2018   Procedure: BIOPSY;  Surgeon: Daneil Dolin, MD;  Location: [facility];  Service: Endoscopy;;  colon   . CHOLECYSTECTOMY    . COLONOSCOPY WITH PROPOFOL N/A 11/10/2018   Procedure: COLONOSCOPY WITH PROPOFOL;  Surgeon: Daneil Dolin, MD;  Location: [facility];  Service: Endoscopy;  Laterality: N/A;  8:45am  . POLYPECTOMY  11/10/2018   Procedure: POLYPECTOMY;  Surgeon: Daneil Dolin, MD;  Location: [facility];  Service: Endoscopy;;  colon   . TONSILLECTOMY      Current Outpatient Medications  Medication Sig Dispense Refill  . acetaminophen (TYLENOL) 500 MG tablet Take 1,000 mg by mouth as needed.     Marland Kitchen amLODipine (NORVASC) 5 MG tablet Take 5 mg by mouth daily.    Marland Kitchen aspirin EC 325 MG tablet Take 325 mg by mouth daily.    Marland Kitchen atorvastatin  (LIPITOR) 80 MG tablet Take 80 mg by mouth daily.  5  . cetirizine (ZYRTEC) 10 MG tablet Take 10 mg by mouth daily.    . cholestyramine (QUESTRAN) 4 g packet Take 1 packet (4 g total) by mouth 2 (two) times daily. (Patient taking differently: Take 4 g by mouth daily. ) 60 each 3  . estradiol (ESTRACE) 1 MG tablet Take 1 tablet by mouth at bedtime.    . Famotidine (PEPCID AC PO) Take by mouth as needed.    . furosemide (LASIX) 40 MG tablet TAKE ONE TABLET BY MOUTH EVERY OTHER DAY (Patient taking differently: Take 40 mg by mouth daily. ) 15 tablet 2  . glipiZIDE (GLUCOTROL) 10 MG tablet Take 10 mg by mouth 2 (two) times daily before a meal.    . ibuprofen (ADVIL) 200 MG tablet Take 200 mg by mouth as needed.     . insulin NPH Human (HUMULIN N,NOVOLIN N) 100 UNIT/ML injection Inject 10 Units into the skin 3 (three) times daily. Before Meals (states only takes if  eats a big meal)    . insulin NPH-regular Human (NOVOLIN 70/30 RELION) (70-30) 100 UNIT/ML injection Inject 60 Units into the skin at bedtime.     Marland Kitchen lisinopril (PRINIVIL,ZESTRIL) 40 MG tablet Take 1 tablet by mouth daily.    . Loperamide HCl (ANTI-DIARRHEAL PO) Take by mouth as needed.    . metFORMIN (GLUCOPHAGE) 1000 MG tablet Take 1,000 mg by mouth 2 (two) times daily with a meal.    . metoprolol succinate (TOPROL-XL) 100 MG 24 hr tablet Take 100 mg by mouth daily. Take with or immediately following a meal.    . potassium chloride (K-DUR) 10 MEQ tablet TAKE ONE TABLET BY MOUTH EVERY OTHER DAY (Patient taking differently: Take 10 mEq by mouth daily. ) 15 tablet 2   No current facility-administered medications for this visit.    Allergies as of 12/20/2019  . (No Known Allergies)    Family History  Problem Relation Age of Onset  . Cancer Sister        ovarian  . Diabetes Brother   . Colon cancer Neg Hx     Social History   Socioeconomic History  . Marital status: Married    Spouse name: Not on file  . Number of children: Not  on file  . Years of education: Not on file  . Highest education level: Not on file  Occupational History  . Not on file  Tobacco Use  . Smoking status: Never Smoker  . Smokeless tobacco: Never Used  Substance and Sexual Activity  . Alcohol use: No  . Drug use: No  . Sexual activity: Not Currently  Other Topics Concern  . Not on file  Social History Narrative   ** Merged History Encounter **       Social Determinants of Health   Financial Resource Strain:   . Difficulty of Paying Living Expenses: Not on file  Food Insecurity:   . Worried About Charity fundraiser in the Last Year: Not on file  . Ran Out of Food in the Last Year: Not on file  Transportation Needs:   . Lack of Transportation (Medical): Not on file  . Lack of Transportation (Non-Medical): Not on file  Physical Activity:   . Days of Exercise per Week: Not on file  . Minutes of Exercise per Session: Not on file  Stress:   . Feeling of Stress : Not on file  Social Connections:   . Frequency of Communication with Friends and Family: Not on file  . Frequency of Social Gatherings with Friends and Family: Not on file  . Attends Religious Services: Not on file  . Active Member of Clubs or Organizations: Not on file  . Attends Archivist Meetings: Not on file  . Marital Status: Not on file    Review of Systems: General: Negative for anorexia, weight loss, fever, chills, fatigue, weakness. ENT: Negative for hoarseness, difficulty swallowing. CV: Negative for chest pain, angina, palpitations, peripheral edema.  Respiratory: Negative for dyspnea at rest, cough, sputum, wheezing.  GI: See history of present illness. Endo: Negative for unusual weight change.  Heme: Negative for bruising or bleeding. Allergy: Negative for rash or hives.   Physical Exam: BP 136/62   Pulse (!) 45   Temp 97.7 F (36.5 C) (Temporal)   Ht 5' 1.5" (1.562 m)   Wt 269 lb 12.8 oz (122.4 kg)   BMI 50.15 kg/m  General:    Alert and oriented. Pleasant and cooperative. Well-nourished and  well-developed.  Eyes:  Without icterus, sclera clear and conjunctiva pink.  Ears:  Normal auditory acuity. Cardiovascular:  S1, S2 present without murmurs appreciated. Extremities without clubbing or edema. Respiratory:  Clear to auscultation bilaterally. No wheezes, rales, or rhonchi. No distress.  Gastrointestinal:  +BS, soft, non-tender and non-distended. No HSM noted. No guarding or rebound. No masses appreciated.  Rectal:  Deferred  Musculoskalatal:  Symmetrical without gross deformities. Neurologic:  Alert and oriented x4;  grossly normal neurologically. Psych:  Alert and cooperative. Normal mood and affect. Heme/Lymph/Immune: No excessive bruising noted.    12/20/2019 4:27 PM   Disclaimer: This note was dictated with voice recognition software. Similar sounding words can inadvertently be transcribed and may not be corrected upon review.

## 2019-12-20 NOTE — Assessment & Plan Note (Signed)
GERD symptoms continue to do well on Pepcid.  Recommend she continue her current dosing which is as needed.  Follow-up in 3 months.

## 2019-12-21 ENCOUNTER — Encounter: Payer: Self-pay | Admitting: Internal Medicine

## 2019-12-25 DIAGNOSIS — G4733 Obstructive sleep apnea (adult) (pediatric): Secondary | ICD-10-CM | POA: Diagnosis not present

## 2019-12-26 ENCOUNTER — Ambulatory Visit: Payer: Medicare HMO | Attending: Internal Medicine

## 2019-12-26 DIAGNOSIS — Z23 Encounter for immunization: Secondary | ICD-10-CM

## 2019-12-26 NOTE — Progress Notes (Signed)
   Covid-19 Vaccination Clinic  Name:  Ebony Stevens    MRN: [id number] DOB: 08-May-1951  12/26/2019  Ms. Ebony Stevens was observed post Covid-19 immunization for 15 minutes without incident. She was provided with Vaccine Information Sheet and instruction to access the V-Safe system.   Ms. Ebony Stevens was instructed to call 911 with any severe reactions post vaccine: Marland Kitchen Difficulty breathing  . Swelling of face and throat  . A fast heartbeat  . A bad rash all over body  . Dizziness and weakness   Immunizations Administered    Name Date Dose VIS Date Route   Pfizer COVID-19 Vaccine 12/26/2019 11:00 AM 0.3 mL 09/22/2019 Intramuscular   Manufacturer: [place]   Lot: [id number]   [place]: [id number]

## 2019-12-27 DIAGNOSIS — M9903 Segmental and somatic dysfunction of lumbar region: Secondary | ICD-10-CM | POA: Diagnosis not present

## 2019-12-27 DIAGNOSIS — M9905 Segmental and somatic dysfunction of pelvic region: Secondary | ICD-10-CM | POA: Diagnosis not present

## 2019-12-27 DIAGNOSIS — M9902 Segmental and somatic dysfunction of thoracic region: Secondary | ICD-10-CM | POA: Diagnosis not present

## 2019-12-27 DIAGNOSIS — M5441 Lumbago with sciatica, right side: Secondary | ICD-10-CM | POA: Diagnosis not present

## 2019-12-29 DIAGNOSIS — M9903 Segmental and somatic dysfunction of lumbar region: Secondary | ICD-10-CM | POA: Diagnosis not present

## 2019-12-29 DIAGNOSIS — M5441 Lumbago with sciatica, right side: Secondary | ICD-10-CM | POA: Diagnosis not present

## 2019-12-29 DIAGNOSIS — M9905 Segmental and somatic dysfunction of pelvic region: Secondary | ICD-10-CM | POA: Diagnosis not present

## 2019-12-29 DIAGNOSIS — M9902 Segmental and somatic dysfunction of thoracic region: Secondary | ICD-10-CM | POA: Diagnosis not present

## 2020-01-01 DIAGNOSIS — M5441 Lumbago with sciatica, right side: Secondary | ICD-10-CM | POA: Diagnosis not present

## 2020-01-01 DIAGNOSIS — M9903 Segmental and somatic dysfunction of lumbar region: Secondary | ICD-10-CM | POA: Diagnosis not present

## 2020-01-01 DIAGNOSIS — M9905 Segmental and somatic dysfunction of pelvic region: Secondary | ICD-10-CM | POA: Diagnosis not present

## 2020-01-01 DIAGNOSIS — M9902 Segmental and somatic dysfunction of thoracic region: Secondary | ICD-10-CM | POA: Diagnosis not present

## 2020-01-03 DIAGNOSIS — M9902 Segmental and somatic dysfunction of thoracic region: Secondary | ICD-10-CM | POA: Diagnosis not present

## 2020-01-03 DIAGNOSIS — M5441 Lumbago with sciatica, right side: Secondary | ICD-10-CM | POA: Diagnosis not present

## 2020-01-03 DIAGNOSIS — M9905 Segmental and somatic dysfunction of pelvic region: Secondary | ICD-10-CM | POA: Diagnosis not present

## 2020-01-03 DIAGNOSIS — M9903 Segmental and somatic dysfunction of lumbar region: Secondary | ICD-10-CM | POA: Diagnosis not present

## 2020-01-05 DIAGNOSIS — M9905 Segmental and somatic dysfunction of pelvic region: Secondary | ICD-10-CM | POA: Diagnosis not present

## 2020-01-05 DIAGNOSIS — M5441 Lumbago with sciatica, right side: Secondary | ICD-10-CM | POA: Diagnosis not present

## 2020-01-05 DIAGNOSIS — M9902 Segmental and somatic dysfunction of thoracic region: Secondary | ICD-10-CM | POA: Diagnosis not present

## 2020-01-05 DIAGNOSIS — M9903 Segmental and somatic dysfunction of lumbar region: Secondary | ICD-10-CM | POA: Diagnosis not present

## 2020-01-08 DIAGNOSIS — M9905 Segmental and somatic dysfunction of pelvic region: Secondary | ICD-10-CM | POA: Diagnosis not present

## 2020-01-08 DIAGNOSIS — M9902 Segmental and somatic dysfunction of thoracic region: Secondary | ICD-10-CM | POA: Diagnosis not present

## 2020-01-08 DIAGNOSIS — M5441 Lumbago with sciatica, right side: Secondary | ICD-10-CM | POA: Diagnosis not present

## 2020-01-08 DIAGNOSIS — M9903 Segmental and somatic dysfunction of lumbar region: Secondary | ICD-10-CM | POA: Diagnosis not present

## 2020-01-11 DIAGNOSIS — M9902 Segmental and somatic dysfunction of thoracic region: Secondary | ICD-10-CM | POA: Diagnosis not present

## 2020-01-11 DIAGNOSIS — M9903 Segmental and somatic dysfunction of lumbar region: Secondary | ICD-10-CM | POA: Diagnosis not present

## 2020-01-11 DIAGNOSIS — M5441 Lumbago with sciatica, right side: Secondary | ICD-10-CM | POA: Diagnosis not present

## 2020-01-11 DIAGNOSIS — M9905 Segmental and somatic dysfunction of pelvic region: Secondary | ICD-10-CM | POA: Diagnosis not present

## 2020-01-17 DIAGNOSIS — M9903 Segmental and somatic dysfunction of lumbar region: Secondary | ICD-10-CM | POA: Diagnosis not present

## 2020-01-17 DIAGNOSIS — M5441 Lumbago with sciatica, right side: Secondary | ICD-10-CM | POA: Diagnosis not present

## 2020-01-17 DIAGNOSIS — M9905 Segmental and somatic dysfunction of pelvic region: Secondary | ICD-10-CM | POA: Diagnosis not present

## 2020-01-17 DIAGNOSIS — M9902 Segmental and somatic dysfunction of thoracic region: Secondary | ICD-10-CM | POA: Diagnosis not present

## 2020-01-19 DIAGNOSIS — M9905 Segmental and somatic dysfunction of pelvic region: Secondary | ICD-10-CM | POA: Diagnosis not present

## 2020-01-19 DIAGNOSIS — M9903 Segmental and somatic dysfunction of lumbar region: Secondary | ICD-10-CM | POA: Diagnosis not present

## 2020-01-19 DIAGNOSIS — M5441 Lumbago with sciatica, right side: Secondary | ICD-10-CM | POA: Diagnosis not present

## 2020-01-19 DIAGNOSIS — M9902 Segmental and somatic dysfunction of thoracic region: Secondary | ICD-10-CM | POA: Diagnosis not present

## 2020-01-22 DIAGNOSIS — M5441 Lumbago with sciatica, right side: Secondary | ICD-10-CM | POA: Diagnosis not present

## 2020-01-22 DIAGNOSIS — M9903 Segmental and somatic dysfunction of lumbar region: Secondary | ICD-10-CM | POA: Diagnosis not present

## 2020-01-22 DIAGNOSIS — M9905 Segmental and somatic dysfunction of pelvic region: Secondary | ICD-10-CM | POA: Diagnosis not present

## 2020-01-22 DIAGNOSIS — M9902 Segmental and somatic dysfunction of thoracic region: Secondary | ICD-10-CM | POA: Diagnosis not present

## 2020-01-25 DIAGNOSIS — E7849 Other hyperlipidemia: Secondary | ICD-10-CM | POA: Diagnosis not present

## 2020-01-25 DIAGNOSIS — E1142 Type 2 diabetes mellitus with diabetic polyneuropathy: Secondary | ICD-10-CM | POA: Diagnosis not present

## 2020-01-25 DIAGNOSIS — E782 Mixed hyperlipidemia: Secondary | ICD-10-CM | POA: Diagnosis not present

## 2020-01-25 DIAGNOSIS — E1122 Type 2 diabetes mellitus with diabetic chronic kidney disease: Secondary | ICD-10-CM | POA: Diagnosis not present

## 2020-01-25 DIAGNOSIS — Z0000 Encounter for general adult medical examination without abnormal findings: Secondary | ICD-10-CM | POA: Diagnosis not present

## 2020-01-25 DIAGNOSIS — E1169 Type 2 diabetes mellitus with other specified complication: Secondary | ICD-10-CM | POA: Diagnosis not present

## 2020-01-25 DIAGNOSIS — E119 Type 2 diabetes mellitus without complications: Secondary | ICD-10-CM | POA: Diagnosis not present

## 2020-01-25 DIAGNOSIS — E1165 Type 2 diabetes mellitus with hyperglycemia: Secondary | ICD-10-CM | POA: Diagnosis not present

## 2020-01-25 DIAGNOSIS — G4733 Obstructive sleep apnea (adult) (pediatric): Secondary | ICD-10-CM | POA: Diagnosis not present

## 2020-01-26 DIAGNOSIS — M9905 Segmental and somatic dysfunction of pelvic region: Secondary | ICD-10-CM | POA: Diagnosis not present

## 2020-01-26 DIAGNOSIS — M9903 Segmental and somatic dysfunction of lumbar region: Secondary | ICD-10-CM | POA: Diagnosis not present

## 2020-01-26 DIAGNOSIS — M5441 Lumbago with sciatica, right side: Secondary | ICD-10-CM | POA: Diagnosis not present

## 2020-01-26 DIAGNOSIS — M9902 Segmental and somatic dysfunction of thoracic region: Secondary | ICD-10-CM | POA: Diagnosis not present

## 2020-01-29 DIAGNOSIS — M9903 Segmental and somatic dysfunction of lumbar region: Secondary | ICD-10-CM | POA: Diagnosis not present

## 2020-01-29 DIAGNOSIS — M9905 Segmental and somatic dysfunction of pelvic region: Secondary | ICD-10-CM | POA: Diagnosis not present

## 2020-01-29 DIAGNOSIS — M9902 Segmental and somatic dysfunction of thoracic region: Secondary | ICD-10-CM | POA: Diagnosis not present

## 2020-01-29 DIAGNOSIS — M5441 Lumbago with sciatica, right side: Secondary | ICD-10-CM | POA: Diagnosis not present

## 2020-01-31 DIAGNOSIS — E782 Mixed hyperlipidemia: Secondary | ICD-10-CM | POA: Diagnosis not present

## 2020-01-31 DIAGNOSIS — I5032 Chronic diastolic (congestive) heart failure: Secondary | ICD-10-CM | POA: Diagnosis not present

## 2020-01-31 DIAGNOSIS — Z0001 Encounter for general adult medical examination with abnormal findings: Secondary | ICD-10-CM | POA: Diagnosis not present

## 2020-01-31 DIAGNOSIS — I129 Hypertensive chronic kidney disease with stage 1 through stage 4 chronic kidney disease, or unspecified chronic kidney disease: Secondary | ICD-10-CM | POA: Diagnosis not present

## 2020-01-31 DIAGNOSIS — K219 Gastro-esophageal reflux disease without esophagitis: Secondary | ICD-10-CM | POA: Diagnosis not present

## 2020-01-31 DIAGNOSIS — E1165 Type 2 diabetes mellitus with hyperglycemia: Secondary | ICD-10-CM | POA: Diagnosis not present

## 2020-01-31 DIAGNOSIS — K760 Fatty (change of) liver, not elsewhere classified: Secondary | ICD-10-CM | POA: Diagnosis not present

## 2020-01-31 DIAGNOSIS — I10 Essential (primary) hypertension: Secondary | ICD-10-CM | POA: Diagnosis not present

## 2020-01-31 DIAGNOSIS — M545 Low back pain: Secondary | ICD-10-CM | POA: Diagnosis not present

## 2020-01-31 DIAGNOSIS — E1122 Type 2 diabetes mellitus with diabetic chronic kidney disease: Secondary | ICD-10-CM | POA: Diagnosis not present

## 2020-01-31 DIAGNOSIS — N1831 Chronic kidney disease, stage 3a: Secondary | ICD-10-CM | POA: Diagnosis not present

## 2020-02-02 DIAGNOSIS — M9905 Segmental and somatic dysfunction of pelvic region: Secondary | ICD-10-CM | POA: Diagnosis not present

## 2020-02-02 DIAGNOSIS — M9902 Segmental and somatic dysfunction of thoracic region: Secondary | ICD-10-CM | POA: Diagnosis not present

## 2020-02-02 DIAGNOSIS — M5441 Lumbago with sciatica, right side: Secondary | ICD-10-CM | POA: Diagnosis not present

## 2020-02-02 DIAGNOSIS — M9903 Segmental and somatic dysfunction of lumbar region: Secondary | ICD-10-CM | POA: Diagnosis not present

## 2020-02-09 DIAGNOSIS — M9903 Segmental and somatic dysfunction of lumbar region: Secondary | ICD-10-CM | POA: Diagnosis not present

## 2020-02-09 DIAGNOSIS — M9905 Segmental and somatic dysfunction of pelvic region: Secondary | ICD-10-CM | POA: Diagnosis not present

## 2020-02-09 DIAGNOSIS — G4733 Obstructive sleep apnea (adult) (pediatric): Secondary | ICD-10-CM | POA: Diagnosis not present

## 2020-02-09 DIAGNOSIS — M5441 Lumbago with sciatica, right side: Secondary | ICD-10-CM | POA: Diagnosis not present

## 2020-02-09 DIAGNOSIS — M9902 Segmental and somatic dysfunction of thoracic region: Secondary | ICD-10-CM | POA: Diagnosis not present

## 2020-02-19 DIAGNOSIS — M9905 Segmental and somatic dysfunction of pelvic region: Secondary | ICD-10-CM | POA: Diagnosis not present

## 2020-02-19 DIAGNOSIS — M5441 Lumbago with sciatica, right side: Secondary | ICD-10-CM | POA: Diagnosis not present

## 2020-02-19 DIAGNOSIS — M9903 Segmental and somatic dysfunction of lumbar region: Secondary | ICD-10-CM | POA: Diagnosis not present

## 2020-02-19 DIAGNOSIS — M9902 Segmental and somatic dysfunction of thoracic region: Secondary | ICD-10-CM | POA: Diagnosis not present

## 2020-02-24 DIAGNOSIS — G4733 Obstructive sleep apnea (adult) (pediatric): Secondary | ICD-10-CM | POA: Diagnosis not present

## 2020-02-26 DIAGNOSIS — G4733 Obstructive sleep apnea (adult) (pediatric): Secondary | ICD-10-CM | POA: Diagnosis not present

## 2020-02-28 DIAGNOSIS — M9903 Segmental and somatic dysfunction of lumbar region: Secondary | ICD-10-CM | POA: Diagnosis not present

## 2020-02-28 DIAGNOSIS — M9902 Segmental and somatic dysfunction of thoracic region: Secondary | ICD-10-CM | POA: Diagnosis not present

## 2020-02-28 DIAGNOSIS — M5441 Lumbago with sciatica, right side: Secondary | ICD-10-CM | POA: Diagnosis not present

## 2020-02-28 DIAGNOSIS — M9905 Segmental and somatic dysfunction of pelvic region: Secondary | ICD-10-CM | POA: Diagnosis not present

## 2020-03-06 DIAGNOSIS — M5441 Lumbago with sciatica, right side: Secondary | ICD-10-CM | POA: Diagnosis not present

## 2020-03-06 DIAGNOSIS — M9903 Segmental and somatic dysfunction of lumbar region: Secondary | ICD-10-CM | POA: Diagnosis not present

## 2020-03-06 DIAGNOSIS — M9905 Segmental and somatic dysfunction of pelvic region: Secondary | ICD-10-CM | POA: Diagnosis not present

## 2020-03-06 DIAGNOSIS — M9902 Segmental and somatic dysfunction of thoracic region: Secondary | ICD-10-CM | POA: Diagnosis not present

## 2020-03-15 DIAGNOSIS — M5441 Lumbago with sciatica, right side: Secondary | ICD-10-CM | POA: Diagnosis not present

## 2020-03-15 DIAGNOSIS — M9905 Segmental and somatic dysfunction of pelvic region: Secondary | ICD-10-CM | POA: Diagnosis not present

## 2020-03-15 DIAGNOSIS — M9902 Segmental and somatic dysfunction of thoracic region: Secondary | ICD-10-CM | POA: Diagnosis not present

## 2020-03-15 DIAGNOSIS — M9903 Segmental and somatic dysfunction of lumbar region: Secondary | ICD-10-CM | POA: Diagnosis not present

## 2020-03-18 DIAGNOSIS — M545 Low back pain: Secondary | ICD-10-CM | POA: Diagnosis not present

## 2020-03-18 DIAGNOSIS — M5136 Other intervertebral disc degeneration, lumbar region: Secondary | ICD-10-CM | POA: Diagnosis not present

## 2020-03-18 DIAGNOSIS — M47816 Spondylosis without myelopathy or radiculopathy, lumbar region: Secondary | ICD-10-CM | POA: Diagnosis not present

## 2020-03-26 DIAGNOSIS — M545 Low back pain: Secondary | ICD-10-CM | POA: Diagnosis not present

## 2020-03-26 DIAGNOSIS — R2689 Other abnormalities of gait and mobility: Secondary | ICD-10-CM | POA: Diagnosis not present

## 2020-03-26 DIAGNOSIS — M47816 Spondylosis without myelopathy or radiculopathy, lumbar region: Secondary | ICD-10-CM | POA: Diagnosis not present

## 2020-03-26 DIAGNOSIS — G4733 Obstructive sleep apnea (adult) (pediatric): Secondary | ICD-10-CM | POA: Diagnosis not present

## 2020-03-26 DIAGNOSIS — M5136 Other intervertebral disc degeneration, lumbar region: Secondary | ICD-10-CM | POA: Diagnosis not present

## 2020-03-27 DIAGNOSIS — M545 Low back pain: Secondary | ICD-10-CM | POA: Diagnosis not present

## 2020-03-27 DIAGNOSIS — R2689 Other abnormalities of gait and mobility: Secondary | ICD-10-CM | POA: Diagnosis not present

## 2020-03-27 DIAGNOSIS — M5136 Other intervertebral disc degeneration, lumbar region: Secondary | ICD-10-CM | POA: Diagnosis not present

## 2020-03-27 DIAGNOSIS — M47816 Spondylosis without myelopathy or radiculopathy, lumbar region: Secondary | ICD-10-CM | POA: Diagnosis not present

## 2020-03-29 DIAGNOSIS — M545 Low back pain: Secondary | ICD-10-CM | POA: Diagnosis not present

## 2020-03-29 DIAGNOSIS — M5136 Other intervertebral disc degeneration, lumbar region: Secondary | ICD-10-CM | POA: Diagnosis not present

## 2020-03-29 DIAGNOSIS — M47816 Spondylosis without myelopathy or radiculopathy, lumbar region: Secondary | ICD-10-CM | POA: Diagnosis not present

## 2020-03-29 DIAGNOSIS — R2689 Other abnormalities of gait and mobility: Secondary | ICD-10-CM | POA: Diagnosis not present

## 2020-04-01 DIAGNOSIS — R2689 Other abnormalities of gait and mobility: Secondary | ICD-10-CM | POA: Diagnosis not present

## 2020-04-01 DIAGNOSIS — M47816 Spondylosis without myelopathy or radiculopathy, lumbar region: Secondary | ICD-10-CM | POA: Diagnosis not present

## 2020-04-01 DIAGNOSIS — M5136 Other intervertebral disc degeneration, lumbar region: Secondary | ICD-10-CM | POA: Diagnosis not present

## 2020-04-01 DIAGNOSIS — M545 Low back pain: Secondary | ICD-10-CM | POA: Diagnosis not present

## 2020-04-03 DIAGNOSIS — M47816 Spondylosis without myelopathy or radiculopathy, lumbar region: Secondary | ICD-10-CM | POA: Diagnosis not present

## 2020-04-03 DIAGNOSIS — R2689 Other abnormalities of gait and mobility: Secondary | ICD-10-CM | POA: Diagnosis not present

## 2020-04-03 DIAGNOSIS — M5136 Other intervertebral disc degeneration, lumbar region: Secondary | ICD-10-CM | POA: Diagnosis not present

## 2020-04-03 DIAGNOSIS — M545 Low back pain: Secondary | ICD-10-CM | POA: Diagnosis not present

## 2020-04-05 DIAGNOSIS — M5136 Other intervertebral disc degeneration, lumbar region: Secondary | ICD-10-CM | POA: Diagnosis not present

## 2020-04-05 DIAGNOSIS — R2689 Other abnormalities of gait and mobility: Secondary | ICD-10-CM | POA: Diagnosis not present

## 2020-04-05 DIAGNOSIS — M47816 Spondylosis without myelopathy or radiculopathy, lumbar region: Secondary | ICD-10-CM | POA: Diagnosis not present

## 2020-04-05 DIAGNOSIS — M545 Low back pain: Secondary | ICD-10-CM | POA: Diagnosis not present

## 2020-04-08 DIAGNOSIS — M47816 Spondylosis without myelopathy or radiculopathy, lumbar region: Secondary | ICD-10-CM | POA: Diagnosis not present

## 2020-04-08 DIAGNOSIS — R2689 Other abnormalities of gait and mobility: Secondary | ICD-10-CM | POA: Diagnosis not present

## 2020-04-08 DIAGNOSIS — M545 Low back pain: Secondary | ICD-10-CM | POA: Diagnosis not present

## 2020-04-08 DIAGNOSIS — M5136 Other intervertebral disc degeneration, lumbar region: Secondary | ICD-10-CM | POA: Diagnosis not present

## 2020-04-10 ENCOUNTER — Other Ambulatory Visit: Payer: Self-pay

## 2020-04-10 ENCOUNTER — Encounter: Payer: Self-pay | Admitting: *Deleted

## 2020-04-10 ENCOUNTER — Ambulatory Visit: Payer: Medicare HMO | Admitting: Nurse Practitioner

## 2020-04-10 ENCOUNTER — Encounter: Payer: Self-pay | Admitting: Nurse Practitioner

## 2020-04-10 VITALS — BP 160/60 | HR 55 | Temp 97.4°F | Ht 62.0 in | Wt 271.6 lb

## 2020-04-10 DIAGNOSIS — K219 Gastro-esophageal reflux disease without esophagitis: Secondary | ICD-10-CM

## 2020-04-10 DIAGNOSIS — M47816 Spondylosis without myelopathy or radiculopathy, lumbar region: Secondary | ICD-10-CM | POA: Diagnosis not present

## 2020-04-10 DIAGNOSIS — M5136 Other intervertebral disc degeneration, lumbar region: Secondary | ICD-10-CM | POA: Diagnosis not present

## 2020-04-10 DIAGNOSIS — K7460 Unspecified cirrhosis of liver: Secondary | ICD-10-CM | POA: Diagnosis not present

## 2020-04-10 DIAGNOSIS — R1084 Generalized abdominal pain: Secondary | ICD-10-CM

## 2020-04-10 DIAGNOSIS — M545 Low back pain: Secondary | ICD-10-CM | POA: Diagnosis not present

## 2020-04-10 DIAGNOSIS — R2689 Other abnormalities of gait and mobility: Secondary | ICD-10-CM | POA: Diagnosis not present

## 2020-04-10 DIAGNOSIS — K529 Noninfective gastroenteritis and colitis, unspecified: Secondary | ICD-10-CM

## 2020-04-10 NOTE — Assessment & Plan Note (Signed)
Chronic diarrhea likely multifactorial in nature, well managed with as needed Questran and antidiarrheal.  Her quality of life is significantly better and is able to lead her life as she would like.  Recommend she continue her current medications, notify us of any changes.  Follow-up in 6 months.

## 2020-04-10 NOTE — Patient Instructions (Signed)
Your health issues we discussed today were:   Cirrhosis: 1. As we discussed, avoid things that can cause further damage to your liver such as alcohol and high doses of Tylenol 2. 500 mg Tylenol once a day in the evening is okay 3. Complete your hepatitis B vaccine series next week 4. Have your labs completed when you are able to which will help [place] calculate your liver function 5. We will help schedule your ultrasound for you to check for any changes in your liver 6. Call us if you have any worsening or significant symptoms  GERD (reflux/heartburn): 1. I am glad your symptoms are doing well 2. Avoid trigger foods as much as possible (especially spicy food) 3. Use your acid blocker as needed 4. Call us for any worsening or severe symptoms  Chronic diarrhea: 1. I am glad your diarrhea is better managed 2. Continue to take Questran and antidiarrheal as needed to control your loose stools 3. Call us for any worsening symptoms  Chronic abdominal pain: 1. As we discussed, this may be interrelated with your chronic whole body right-sided pain in your shoulder, back, legs 2. Let us know if the pain gets more severe or if it changes in someway  Overall I recommend:  1. Continue your other current medications 2. Return for follow-up in 6 months 3. Call us if you have any questions or concerns   ---------------------------------------------------------------  I am glad you have gotten your COVID-19 vaccination!  Even though you are fully vaccinated you should continue to follow CDC and state/local guidelines.  ---------------------------------------------------------------   At [facility] we value your feedback. You may receive a survey about your visit today. Please share your experience as we strive to create trusting relationships with our patients to provide genuine, compassionate, quality care.  We appreciate your understanding and patience as we review any  laboratory studies, imaging, and other diagnostic tests that are ordered as we care for you. Our office policy is 5 business days for review of these results, and any emergent or urgent results are addressed in a timely manner for your best interest. If you do not hear from our office in 1 week, please contact us.   We also encourage the use of MyChart, which contains your medical information for your review as well. If you are not enrolled in this feature, an access code is on this after visit summary for your convenience. Thank you for allowing [place] to be involved in your care.  It was great to see you today!  I hope you have a great Summer!!

## 2020-04-10 NOTE — Assessment & Plan Note (Signed)
Likely cirrhosis based on previous medical history at [facility] and status post right upper quadrant ultrasound elastography completed recently which showed some F3 and F4 with high-grade fibrosis/cirrhosis likely.  At this point we will get her started on a routine liver care schedule with labs and imaging every 6 months.  We discussed avoiding other liver injuries such as alcohol and high-dose Tylenol.  She does not drink, only takes about 500 mg Tylenol for her joint pains once a day.  She is scheduled to take the last vaccine and hepatitis B series next week.  She has had COVID-19 vaccination x2.  At this point I will check CBC, CMP, INR, AFP for calculation of meld score and child Pugh class.  Also update her right upper quadrant ultrasound for hepatoma screening.  Follow-up in 6 months for routine liver care.

## 2020-04-10 NOTE — Assessment & Plan Note (Signed)
Chronic GERD currently well managed on PPI taken as needed.  She is only required about 10 doses in the last 3 months.  She typically only requires a dose when she has spicy food.  I recommended lifestyle changes and trigger avoidance.  Follow-up in 6 months.  Call for worsening symptoms.

## 2020-04-10 NOTE — Progress Notes (Signed)
Referring Provider: Celene Squibb, MD Primary Care Physician:  Celene Squibb, MD Primary GI:  Dr. Gala Romney  Chief Complaint  Patient presents with  . Abdominal Pain    right side; sometimes up to right shoulder    HPI:   Ebony Stevens is a 69 y.o. female who presents for follow-up on GERD and abdominal pain.  The patient was last seen in our office 12/20/2019 for GERD, lower abdominal pain, chronic diarrhea.  Noted history of chronic diarrhea, cirrhosis.  Diarrhea for 10+ years with fecal incontinence with 4-5 stools a day to tend to be soft/formed and become progressively more loose.  Change in Metformin helped.  Status post cholecystectomy.  Previously noted liver mass on MRI and CT of the abdomen pelvis in May 2019 found moderate hepatic steatosis and hepatomegaly likely due to evolution of remote insult possibly related to prior cholecystectomy.  Colonoscopy up-to-date with recommended 10-year repeat in 2030.  See office note dated 01/18/2023 outline of records from [facility]. Essentially the patient had abnormal spots on her liver MRI with a gallium scan follow-up found 1 to be possibly metastasis versus hepatocellular carcinoma and second lesion not identified on gallium scan. Generally felt to be increased fat in the liver with plans to follow closely with MRI to ensure stability (this was in 2001). ERCP due to abnormal LFTs and liver lesion showed atrophic segments 2 and 3 of the liver with ductal proliferation. Status postcholecystectomy in 2001 with abnormal appearing fatty/nodular liver status post biopsy and some question as to possible toxic effect. Biopsies of the left and right lobe showed different levels of disease activity. We attempted to get the operative report and biopsy results but they were prior to 2007 at [facility] and records cannot be found.  Right upper quadrant ultrasound elastography of the liver in August 2020 with F3/F4 deemed high risk for  fibrosis/cirrhosis.  At her last visit she noted she had received her first COVID-19 vaccine was scheduled for second dose upcoming.  Has received first 2 vaccines for hepatitis B and scheduled for third dose in July.  Questran did help her diarrhea, found once daily dosing to be most effective.  History of partial hysterectomy, full hysterectomy, exploratory laparotomy with significant abdominal pain.  Pain typically resolves after a bowel movement.  GERD well managed on Pepcid as needed.  No other overt GI complaints.  Recommended continue Questran, discussed ways to help remember to take medication, continue Pepcid as needed, follow-up in 3 months.  Today she states she's doing ok. Has persistent abdominal pain on the right side, which radiates up into her shoulder. She was given a muscle relaxer in June for her right-sided back and leg; generally "it's my whole right-side." She has been using Questran, though not daily, and anti-diarrheal. This helps her manage the diarrhea well. GERD doing well on PPI, only needs it prn (like when she eats something spicy); has needed it less than 10 times in the past 3 months. Denies N/V, hematochezia, melena, fever, chills, unintentional weight loss. Denies any yellowing of the skin/eyes, darkened urine, acute episodic confusion, generalized tremors, generalized pruritis. Denies URI or flu-like symptoms. Denies loss of sense of taste or smell. The patient has received COVID-19 vaccination(s). Denies chest pain, dyspnea, dizziness, lightheadedness, syncope, near syncope. Denies any other upper or lower GI symptoms.   She gets her last Hep C vaccine in the series next week.  Past Medical History:  Diagnosis Date  .  Asthma   . Chest pain 07/11/2015  . Diabetes mellitus without complication ([place])   . Dyspnea 07/11/2015  . Excessive bleeding in the premenopausal period   . Fibromyalgia   . GERD (gastroesophageal reflux disease)   . HTN (hypertension)   .  Hypertension   . Localized edema   . Mixed hyperlipidemia   . Type 2 diabetes mellitus ([place])     Past Surgical History:  Procedure Laterality Date  . ABDOMINAL HYSTERECTOMY     1975 partial hysterectomy,  1978 "complete hysterctomy and retack bladder". states it didn't take.   Marland Kitchen BIOPSY  11/10/2018   Procedure: BIOPSY;  Surgeon: Daneil Dolin, MD;  Location: [facility];  Service: Endoscopy;;  colon   . CHOLECYSTECTOMY    . COLONOSCOPY WITH PROPOFOL N/A 11/10/2018   Procedure: COLONOSCOPY WITH PROPOFOL;  Surgeon: Daneil Dolin, MD;  Location: [facility];  Service: Endoscopy;  Laterality: N/A;  8:45am  . POLYPECTOMY  11/10/2018   Procedure: POLYPECTOMY;  Surgeon: Daneil Dolin, MD;  Location: [facility];  Service: Endoscopy;;  colon   . TONSILLECTOMY      Current Outpatient Medications  Medication Sig Dispense Refill  . acetaminophen (TYLENOL) 500 MG tablet Take 1,000 mg by mouth as needed.     Marland Kitchen amLODipine (NORVASC) 5 MG tablet Take 5 mg by mouth daily.    Marland Kitchen aspirin EC 325 MG tablet Take 325 mg by mouth daily.    Marland Kitchen atorvastatin (LIPITOR) 80 MG tablet Take 80 mg by mouth daily.  5  . baclofen (LIORESAL) 10 MG tablet Take 10 mg by mouth 2 (two) times daily as needed.    Marland Kitchen BIOTIN PO Take by mouth daily.    . Calcium Carbonate-Vitamin D (CALTRATE 600+D PO) Take by mouth daily.    . cetirizine (ZYRTEC) 10 MG tablet Take 10 mg by mouth daily.    . cholestyramine (QUESTRAN) 4 g packet Take 1 packet (4 g total) by mouth 2 (two) times daily. (Patient taking differently: Take 4 g by mouth as needed. ) 60 each 3  . estradiol (ESTRACE) 1 MG tablet Take 1 tablet by mouth at bedtime.    . Famotidine (PEPCID AC PO) Take by mouth as needed.    . furosemide (LASIX) 40 MG tablet TAKE ONE TABLET BY MOUTH EVERY OTHER DAY (Patient taking differently: Take 40 mg by mouth daily. ) 15 tablet 2  . glipiZIDE (GLUCOTROL) 10 MG tablet Take 10 mg by mouth 2 (two) times daily before a meal.    .  insulin NPH-regular Human (NOVOLIN 70/30 RELION) (70-30) 100 UNIT/ML injection Inject 60 Units into the skin at bedtime.     . insulin regular (NOVOLIN R) 100 units/mL injection Inject 10 Units into the skin 3 (three) times daily before meals. Doesn't take every meal    . lisinopril (PRINIVIL,ZESTRIL) 40 MG tablet Take 1 tablet by mouth daily.    . Loperamide HCl (ANTI-DIARRHEAL PO) Take by mouth as needed.    . metFORMIN (GLUCOPHAGE) 1000 MG tablet Take 1,000 mg by mouth 2 (two) times daily with a meal.    . metoprolol succinate (TOPROL-XL) 100 MG 24 hr tablet Take 100 mg by mouth daily. Take with or immediately following a meal.    . OVER THE COUNTER MEDICATION Magnesium 1 tablet by mouth once a day    . potassium chloride (K-DUR) 10 MEQ tablet TAKE ONE TABLET BY MOUTH EVERY OTHER DAY (Patient taking differently: Take 10 mEq  by mouth daily. ) 15 tablet 2   No current facility-administered medications for this visit.    Allergies as of 04/10/2020  . (No Known Allergies)    Family History  Problem Relation Age of Onset  . Cancer Sister        ovarian  . Diabetes Brother   . Colon cancer Neg Hx     Social History   Socioeconomic History  . Marital status: Married    Spouse name: Not on file  . Number of children: Not on file  . Years of education: Not on file  . Highest education level: Not on file  Occupational History  . Not on file  Tobacco Use  . Smoking status: Never Smoker  . Smokeless tobacco: Never Used  Vaping Use  . Vaping Use: Never used  Substance and Sexual Activity  . Alcohol use: No  . Drug use: No  . Sexual activity: Not Currently  Other Topics Concern  . Not on file  Social History Narrative   ** Merged History Encounter **       Social Determinants of Health   Financial Resource Strain:   . Difficulty of Paying Living Expenses:   Food Insecurity:   . Worried About Charity fundraiser in the Last Year:   . Arboriculturist in the Last Year:    Transportation Needs:   . Film/video editor (Medical):   Marland Kitchen Lack of Transportation (Non-Medical):   Physical Activity:   . Days of Exercise per Week:   . Minutes of Exercise per Session:   Stress:   . Feeling of Stress :   Social Connections:   . Frequency of Communication with Friends and Family:   . Frequency of Social Gatherings with Friends and Family:   . Attends Religious Services:   . Active Member of Clubs or Organizations:   . Attends Archivist Meetings:   Marland Kitchen Marital Status:     Subjective: Review of Systems  Constitutional: Negative for chills, fever, malaise/fatigue and weight loss.  HENT: Negative for congestion and sore throat.   Respiratory: Negative for cough and shortness of breath.   Cardiovascular: Negative for chest pain and palpitations.  Gastrointestinal: Positive for abdominal pain (Chronic, RUQ) and diarrhea (Chronic, managed with meds). Negative for blood in stool, melena, nausea and vomiting.  Musculoskeletal: Positive for back pain and joint pain. Negative for myalgias.       Right sided pain shoulder through legs  Skin: Negative for rash.  Neurological: Negative for dizziness and weakness.  Endo/Heme/Allergies: Does not bruise/bleed easily.  Psychiatric/Behavioral: Negative for depression. The patient is not nervous/anxious.   All other systems reviewed and are negative.    Objective: BP (!) 160/60   Pulse (!) 55   Temp (!) 97.4 F (36.3 C) (Oral)   Ht 5\' 2"  (1.575 m)   Wt 271 lb 9.6 oz (123.2 kg)   BMI 49.68 kg/m  Physical Exam Vitals and nursing note reviewed.  Constitutional:      General: She is not in acute distress.    Appearance: Normal appearance. She is well-developed. She is obese. She is not ill-appearing, toxic-appearing or diaphoretic.  HENT:     Head: Normocephalic and atraumatic.     Nose: No congestion or rhinorrhea.  Eyes:     General: No scleral icterus. Cardiovascular:     Rate and Rhythm: Normal rate  and regular rhythm.     Heart sounds: Normal heart sounds.  Pulmonary:     Effort: Pulmonary effort is normal. No respiratory distress.     Breath sounds: Normal breath sounds.  Abdominal:     General: Bowel sounds are normal.     Palpations: Abdomen is soft. There is no hepatomegaly, splenomegaly or mass.     Tenderness: There is no abdominal tenderness. There is no guarding or rebound.     Hernia: No hernia is present.  Skin:    General: Skin is warm and dry.     Coloration: Skin is not jaundiced.     Findings: No rash.  Neurological:     General: No focal deficit present.     Mental Status: She is alert and oriented to person, place, and time.  Psychiatric:        Attention and Perception: Attention normal.        Mood and Affect: Mood normal.        Speech: Speech normal.        Behavior: Behavior normal.        Thought Content: Thought content normal.        Cognition and Memory: Cognition and memory normal.       04/10/2020 11:43 AM   Disclaimer: This note was dictated with voice recognition software. Similar sounding words can inadvertently be transcribed and may not be corrected upon review.

## 2020-04-10 NOTE — Assessment & Plan Note (Signed)
Noted chronic right-sided abdominal pain that is stable status post significant work-up including exploratory laparotomy without findings.  There is associated with right-sided shoulder pain, back pain, hip pain and thinks it might be all related.  I recommend she continue to monitor, continue to follow-up with primary care, notify us of any worsening abdominal pain or changes in her abdominal pain.  Follow-up in 6 months.

## 2020-04-11 LAB — COMPREHENSIVE METABOLIC PANEL
AG Ratio: 1.7 (calc) (ref 1.0–2.5)
ALT: 18 U/L (ref 6–29)
AST: 19 U/L (ref 10–35)
Albumin: 4.1 g/dL (ref 3.6–5.1)
Alkaline phosphatase (APISO): 105 U/L (ref 37–153)
BUN: 16 mg/dL (ref 7–25)
CO2: 29 mmol/L (ref 20–32)
Calcium: 9.1 mg/dL (ref 8.6–10.4)
Chloride: 104 mmol/L (ref 98–110)
Creat: 0.91 mg/dL (ref 0.50–0.99)
Globulin: 2.4 g/dL (calc) (ref 1.9–3.7)
Glucose, Bld: 141 mg/dL — ABNORMAL HIGH (ref 65–139)
Potassium: 4.4 mmol/L (ref 3.5–5.3)
Sodium: 138 mmol/L (ref 135–146)
Total Bilirubin: 0.6 mg/dL (ref 0.2–1.2)
Total Protein: 6.5 g/dL (ref 6.1–8.1)

## 2020-04-11 LAB — CBC WITH DIFFERENTIAL/PLATELET
Absolute Monocytes: 284 cells/uL (ref 200–950)
Basophils Absolute: 20 cells/uL (ref 0–200)
Basophils Relative: 0.4 %
Eosinophils Absolute: 69 cells/uL (ref 15–500)
Eosinophils Relative: 1.4 %
HCT: 38.6 % (ref 35.0–45.0)
Hemoglobin: 12.6 g/dL (ref 11.7–15.5)
Lymphs Abs: 1725 cells/uL (ref 850–3900)
MCH: 29.6 pg (ref 27.0–33.0)
MCHC: 32.6 g/dL (ref 32.0–36.0)
MCV: 90.8 fL (ref 80.0–100.0)
MPV: 10.7 fL (ref 7.5–12.5)
Monocytes Relative: 5.8 %
Neutro Abs: 2803 cells/uL (ref 1500–7800)
Neutrophils Relative %: 57.2 %
Platelets: 154 10*3/uL (ref 140–400)
RBC: 4.25 10*6/uL (ref 3.80–5.10)
RDW: 13.1 % (ref 11.0–15.0)
Total Lymphocyte: 35.2 %
WBC: 4.9 10*3/uL (ref 3.8–10.8)

## 2020-04-11 LAB — PROTIME-INR
INR: 1
Prothrombin Time: 10.3 s (ref 9.0–11.5)

## 2020-04-11 LAB — AFP TUMOR MARKER: AFP-Tumor Marker: 3.9 ng/mL

## 2020-04-12 DIAGNOSIS — R2689 Other abnormalities of gait and mobility: Secondary | ICD-10-CM | POA: Diagnosis not present

## 2020-04-12 DIAGNOSIS — M5136 Other intervertebral disc degeneration, lumbar region: Secondary | ICD-10-CM | POA: Diagnosis not present

## 2020-04-12 DIAGNOSIS — M545 Low back pain: Secondary | ICD-10-CM | POA: Diagnosis not present

## 2020-04-12 DIAGNOSIS — M47816 Spondylosis without myelopathy or radiculopathy, lumbar region: Secondary | ICD-10-CM | POA: Diagnosis not present

## 2020-04-16 DIAGNOSIS — M5136 Other intervertebral disc degeneration, lumbar region: Secondary | ICD-10-CM | POA: Diagnosis not present

## 2020-04-16 DIAGNOSIS — M545 Low back pain: Secondary | ICD-10-CM | POA: Diagnosis not present

## 2020-04-16 DIAGNOSIS — R2689 Other abnormalities of gait and mobility: Secondary | ICD-10-CM | POA: Diagnosis not present

## 2020-04-16 DIAGNOSIS — M47816 Spondylosis without myelopathy or radiculopathy, lumbar region: Secondary | ICD-10-CM | POA: Diagnosis not present

## 2020-04-17 ENCOUNTER — Other Ambulatory Visit (HOSPITAL_COMMUNITY): Payer: Self-pay | Admitting: Internal Medicine

## 2020-04-17 ENCOUNTER — Ambulatory Visit (HOSPITAL_COMMUNITY)
Admission: RE | Admit: 2020-04-17 | Discharge: 2020-04-17 | Disposition: A | Discharge status: Home / Self Care | Payer: Medicare HMO | Source: Ambulatory Visit | Attending: Nurse Practitioner | Admitting: Nurse Practitioner

## 2020-04-17 ENCOUNTER — Other Ambulatory Visit: Payer: Self-pay

## 2020-04-17 DIAGNOSIS — R2689 Other abnormalities of gait and mobility: Secondary | ICD-10-CM | POA: Diagnosis not present

## 2020-04-17 DIAGNOSIS — K219 Gastro-esophageal reflux disease without esophagitis: Secondary | ICD-10-CM

## 2020-04-17 DIAGNOSIS — K7460 Unspecified cirrhosis of liver: Secondary | ICD-10-CM

## 2020-04-17 DIAGNOSIS — M5136 Other intervertebral disc degeneration, lumbar region: Secondary | ICD-10-CM | POA: Diagnosis not present

## 2020-04-17 DIAGNOSIS — M545 Low back pain: Secondary | ICD-10-CM | POA: Diagnosis not present

## 2020-04-17 DIAGNOSIS — K529 Noninfective gastroenteritis and colitis, unspecified: Secondary | ICD-10-CM | POA: Diagnosis not present

## 2020-04-17 DIAGNOSIS — Z1231 Encounter for screening mammogram for malignant neoplasm of breast: Secondary | ICD-10-CM

## 2020-04-17 DIAGNOSIS — R1084 Generalized abdominal pain: Secondary | ICD-10-CM | POA: Diagnosis not present

## 2020-04-17 DIAGNOSIS — M47816 Spondylosis without myelopathy or radiculopathy, lumbar region: Secondary | ICD-10-CM | POA: Diagnosis not present

## 2020-04-19 DIAGNOSIS — R2689 Other abnormalities of gait and mobility: Secondary | ICD-10-CM | POA: Diagnosis not present

## 2020-04-19 DIAGNOSIS — M47816 Spondylosis without myelopathy or radiculopathy, lumbar region: Secondary | ICD-10-CM | POA: Diagnosis not present

## 2020-04-19 DIAGNOSIS — M545 Low back pain: Secondary | ICD-10-CM | POA: Diagnosis not present

## 2020-04-19 DIAGNOSIS — M5136 Other intervertebral disc degeneration, lumbar region: Secondary | ICD-10-CM | POA: Diagnosis not present

## 2020-04-22 DIAGNOSIS — M545 Low back pain: Secondary | ICD-10-CM | POA: Diagnosis not present

## 2020-04-22 DIAGNOSIS — M47816 Spondylosis without myelopathy or radiculopathy, lumbar region: Secondary | ICD-10-CM | POA: Diagnosis not present

## 2020-04-25 DIAGNOSIS — G4733 Obstructive sleep apnea (adult) (pediatric): Secondary | ICD-10-CM | POA: Diagnosis not present

## 2020-05-03 ENCOUNTER — Other Ambulatory Visit: Payer: Self-pay

## 2020-05-03 ENCOUNTER — Ambulatory Visit (HOSPITAL_COMMUNITY)
Admission: RE | Admit: 2020-05-03 | Discharge: 2020-05-03 | Disposition: A | Discharge status: Home / Self Care | Payer: Medicare HMO | Source: Ambulatory Visit | Attending: Internal Medicine | Admitting: Internal Medicine

## 2020-05-03 DIAGNOSIS — Z1231 Encounter for screening mammogram for malignant neoplasm of breast: Secondary | ICD-10-CM | POA: Insufficient documentation

## 2020-05-26 DIAGNOSIS — G4733 Obstructive sleep apnea (adult) (pediatric): Secondary | ICD-10-CM | POA: Diagnosis not present

## 2020-05-27 DIAGNOSIS — G4733 Obstructive sleep apnea (adult) (pediatric): Secondary | ICD-10-CM | POA: Diagnosis not present

## 2020-05-29 DIAGNOSIS — K760 Fatty (change of) liver, not elsewhere classified: Secondary | ICD-10-CM | POA: Diagnosis not present

## 2020-05-29 DIAGNOSIS — K219 Gastro-esophageal reflux disease without esophagitis: Secondary | ICD-10-CM | POA: Diagnosis not present

## 2020-05-29 DIAGNOSIS — Z0000 Encounter for general adult medical examination without abnormal findings: Secondary | ICD-10-CM | POA: Diagnosis not present

## 2020-05-29 DIAGNOSIS — M545 Low back pain: Secondary | ICD-10-CM | POA: Diagnosis not present

## 2020-05-29 DIAGNOSIS — Z0001 Encounter for general adult medical examination with abnormal findings: Secondary | ICD-10-CM | POA: Diagnosis not present

## 2020-05-29 DIAGNOSIS — R197 Diarrhea, unspecified: Secondary | ICD-10-CM | POA: Diagnosis not present

## 2020-05-29 DIAGNOSIS — N1831 Chronic kidney disease, stage 3a: Secondary | ICD-10-CM | POA: Diagnosis not present

## 2020-05-29 DIAGNOSIS — M25551 Pain in right hip: Secondary | ICD-10-CM | POA: Diagnosis not present

## 2020-05-29 DIAGNOSIS — G4733 Obstructive sleep apnea (adult) (pediatric): Secondary | ICD-10-CM | POA: Diagnosis not present

## 2020-05-29 DIAGNOSIS — E1165 Type 2 diabetes mellitus with hyperglycemia: Secondary | ICD-10-CM | POA: Diagnosis not present

## 2020-06-03 DIAGNOSIS — I5032 Chronic diastolic (congestive) heart failure: Secondary | ICD-10-CM | POA: Diagnosis not present

## 2020-06-03 DIAGNOSIS — K219 Gastro-esophageal reflux disease without esophagitis: Secondary | ICD-10-CM | POA: Diagnosis not present

## 2020-06-03 DIAGNOSIS — Z6841 Body Mass Index (BMI) 40.0 and over, adult: Secondary | ICD-10-CM | POA: Diagnosis not present

## 2020-06-03 DIAGNOSIS — I130 Hypertensive heart and chronic kidney disease with heart failure and stage 1 through stage 4 chronic kidney disease, or unspecified chronic kidney disease: Secondary | ICD-10-CM | POA: Diagnosis not present

## 2020-06-03 DIAGNOSIS — I129 Hypertensive chronic kidney disease with stage 1 through stage 4 chronic kidney disease, or unspecified chronic kidney disease: Secondary | ICD-10-CM | POA: Diagnosis not present

## 2020-06-03 DIAGNOSIS — E1122 Type 2 diabetes mellitus with diabetic chronic kidney disease: Secondary | ICD-10-CM | POA: Diagnosis not present

## 2020-06-03 DIAGNOSIS — M545 Low back pain: Secondary | ICD-10-CM | POA: Diagnosis not present

## 2020-06-03 DIAGNOSIS — K760 Fatty (change of) liver, not elsewhere classified: Secondary | ICD-10-CM | POA: Diagnosis not present

## 2020-06-03 DIAGNOSIS — E782 Mixed hyperlipidemia: Secondary | ICD-10-CM | POA: Diagnosis not present

## 2020-06-03 DIAGNOSIS — N1831 Chronic kidney disease, stage 3a: Secondary | ICD-10-CM | POA: Diagnosis not present

## 2020-06-04 DIAGNOSIS — E1165 Type 2 diabetes mellitus with hyperglycemia: Secondary | ICD-10-CM | POA: Diagnosis not present

## 2020-06-04 DIAGNOSIS — K219 Gastro-esophageal reflux disease without esophagitis: Secondary | ICD-10-CM | POA: Diagnosis not present

## 2020-06-04 DIAGNOSIS — E782 Mixed hyperlipidemia: Secondary | ICD-10-CM | POA: Diagnosis not present

## 2020-06-04 DIAGNOSIS — I10 Essential (primary) hypertension: Secondary | ICD-10-CM | POA: Diagnosis not present

## 2020-06-18 DIAGNOSIS — G4733 Obstructive sleep apnea (adult) (pediatric): Secondary | ICD-10-CM | POA: Diagnosis not present

## 2020-06-18 DIAGNOSIS — M545 Low back pain: Secondary | ICD-10-CM | POA: Diagnosis not present

## 2020-06-18 DIAGNOSIS — I129 Hypertensive chronic kidney disease with stage 1 through stage 4 chronic kidney disease, or unspecified chronic kidney disease: Secondary | ICD-10-CM | POA: Diagnosis not present

## 2020-06-18 DIAGNOSIS — R600 Localized edema: Secondary | ICD-10-CM | POA: Diagnosis not present

## 2020-06-18 DIAGNOSIS — Z6841 Body Mass Index (BMI) 40.0 and over, adult: Secondary | ICD-10-CM | POA: Diagnosis not present

## 2020-06-19 DIAGNOSIS — K219 Gastro-esophageal reflux disease without esophagitis: Secondary | ICD-10-CM | POA: Diagnosis not present

## 2020-06-19 DIAGNOSIS — E1165 Type 2 diabetes mellitus with hyperglycemia: Secondary | ICD-10-CM | POA: Diagnosis not present

## 2020-06-19 DIAGNOSIS — I10 Essential (primary) hypertension: Secondary | ICD-10-CM | POA: Diagnosis not present

## 2020-06-19 DIAGNOSIS — E782 Mixed hyperlipidemia: Secondary | ICD-10-CM | POA: Diagnosis not present

## 2020-06-26 DIAGNOSIS — G4733 Obstructive sleep apnea (adult) (pediatric): Secondary | ICD-10-CM | POA: Diagnosis not present

## 2020-07-25 ENCOUNTER — Encounter: Payer: Self-pay | Admitting: Nurse Practitioner

## 2020-07-26 DIAGNOSIS — G4733 Obstructive sleep apnea (adult) (pediatric): Secondary | ICD-10-CM | POA: Diagnosis not present

## 2020-08-14 DIAGNOSIS — E782 Mixed hyperlipidemia: Secondary | ICD-10-CM | POA: Diagnosis not present

## 2020-08-14 DIAGNOSIS — E1165 Type 2 diabetes mellitus with hyperglycemia: Secondary | ICD-10-CM | POA: Diagnosis not present

## 2020-08-14 DIAGNOSIS — K219 Gastro-esophageal reflux disease without esophagitis: Secondary | ICD-10-CM | POA: Diagnosis not present

## 2020-08-14 DIAGNOSIS — I10 Essential (primary) hypertension: Secondary | ICD-10-CM | POA: Diagnosis not present

## 2020-08-26 DIAGNOSIS — G4733 Obstructive sleep apnea (adult) (pediatric): Secondary | ICD-10-CM | POA: Diagnosis not present

## 2020-09-26 ENCOUNTER — Other Ambulatory Visit: Payer: Self-pay

## 2020-09-26 ENCOUNTER — Ambulatory Visit: Payer: Medicare HMO | Admitting: Nurse Practitioner

## 2020-09-26 ENCOUNTER — Encounter: Payer: Self-pay | Admitting: Nurse Practitioner

## 2020-09-26 VITALS — BP 156/60 | HR 44 | Temp 97.3°F | Ht 62.0 in | Wt 262.4 lb

## 2020-09-26 DIAGNOSIS — K529 Noninfective gastroenteritis and colitis, unspecified: Secondary | ICD-10-CM | POA: Diagnosis not present

## 2020-09-26 DIAGNOSIS — K7460 Unspecified cirrhosis of liver: Secondary | ICD-10-CM | POA: Diagnosis not present

## 2020-09-26 DIAGNOSIS — R1084 Generalized abdominal pain: Secondary | ICD-10-CM

## 2020-09-26 DIAGNOSIS — K219 Gastro-esophageal reflux disease without esophagitis: Secondary | ICD-10-CM | POA: Diagnosis not present

## 2020-09-26 DIAGNOSIS — Z800 Family history of malignant neoplasm of digestive organs: Secondary | ICD-10-CM | POA: Insufficient documentation

## 2020-09-26 NOTE — Progress Notes (Signed)
Referring Provider: Celene Squibb, MD Primary Care Physician:  Celene Squibb, MD Primary GI:  Dr. Gala Romney  Chief Complaint  Patient presents with  . Cirrhosis  . Diarrhea    "some"    HPI:   Ebony Stevens is a 69 y.o. female who presents for follow-up on cirrhosis and chronic diarrhea. The patient was last seen in our office 04/10/2020 for the same as well as GERD and abdominal pain.  No history of chronic diarrhea and cirrhosis.  Diarrhea for 10+ years with fecal incontinence and 4-5 stools a day tend to be soft/formed and become progressively more loose.  Metformin change has helped previously.  Past status post cholecystectomy.  History of liver mass on MRI and CT of the abdomen in May 2019 with hepatic steatosis and hepatomegaly likely due to evolution of remote insult possibly related to prior cholecystectomy.  Colonoscopy up-to-date 2020 and next due in 2030.  See office note dated 01/18/2023 outline of records from [facility]. Essentially the patient had abnormal spots on her liver MRI with a gallium scan follow-up found 1 to be possibly metastasis versus hepatocellular carcinoma and second lesion not identified on gallium scan. Generally felt to be increased fat in the liver with plans to follow closely with MRI to ensure stability (this was in 2001). ERCP due to abnormal LFTs and liver lesion showed atrophic segments 2 and 3 of the liver with ductal proliferation. Status postcholecystectomy in 2001 with abnormal appearing fatty/nodular liver status post biopsy and some question as to possible toxic effect. Biopsies of the left and right lobe showed different levels of disease activity. We attempted to get the operative report and biopsy results but they were prior to 2007 at [facility] and records cannot be found.  Ultrasound elastography of the liver in August 2020 found F3/F4 fibrosis/cirrhosis.  At her last visit notes persistent abdominal pain on the right side  radiating to her shoulder, given a muscle relaxer in June for right-sided back and leg and generally "it is my whole right side" that hurts.  Has been using Questran (though not daily as recommended) and antidiarrheal that helps manage her diarrhea pretty well.  GERD adequately managed on PPI as needed.  No other overt hepatic or GI symptoms.  Scheduled to get her last dose of hepatitis B vaccine in the series the following week.  Recommended avoid hepatotoxic substances such as alcohol and Tylenol and large doses, complete hepatitis B vaccine series, update labs and imaging, avoid triggers for GERD, continue PPI, continue Questran and antidiarrheal, follow-up in 6 months.  Labs are completed 04/10/2020 which found normal CBC, essentially normal CMP other than elevated glucose, normal INR, normal AFP.  Right upper quadrant ultrasound completed 04/17/2020 which found suggesting hepatic steatosis or other diffuse hepatocellular disease status post cholecystectomy.  Today states doing okay overall. She still has chronic diarrhea that she manages with Questran and antidiarrheal; states she is having acceptable results. GERD is doing pretty good on Pepcid prn, only needs it if she eats something spicy. She still has her chronic right-sided (more right posterolateral back) pain. Seems to be worse if she takes a deep breath. States she can feel her rib and it hurts when she pushes on it. She is seeing an orthopedic soon to follow-up on possible pinched nerve/back pain. Denies N/V, hematochezia, melena, fever, chills, unintentional weight loss. Denies any yellowing of the skin/eyes, darkened urine, acute episodic confusion, generalized tremors, generalized pruritis. Denies URI  or flu-like symptoms. Denies loss of sense of taste or smell. The patient has received COVID-19 vaccination(s). They have also had their booster dose. Denies chest pain, dyspnea, dizziness, lightheadedness, syncope, near syncope. Denies any  other upper or lower GI symptoms.  Notes her borhter was just diagnosed with colon cancer last year and this year passed from the disease. This will change her screening interval to 5 years.  Past Medical History:  Diagnosis Date  . Asthma   . Chest pain 07/11/2015  . Diabetes mellitus without complication ([place])   . Dyspnea 07/11/2015  . Excessive bleeding in the premenopausal period   . Fibromyalgia   . GERD (gastroesophageal reflux disease)   . HTN (hypertension)   . Hypertension   . Localized edema   . Mixed hyperlipidemia   . Type 2 diabetes mellitus ([place])     Past Surgical History:  Procedure Laterality Date  . ABDOMINAL HYSTERECTOMY     1975 partial hysterectomy,  1978 "complete hysterctomy and retack bladder". states it didn't take.   Marland Kitchen BIOPSY  11/10/2018   Procedure: BIOPSY;  Surgeon: Daneil Dolin, MD;  [facility];  Service: Endoscopy;;  colon   . CHOLECYSTECTOMY    . COLONOSCOPY WITH PROPOFOL N/A 11/10/2018   Procedure: COLONOSCOPY WITH PROPOFOL;  Surgeon: Daneil Dolin, MD;  Location: [facility];  Service: Endoscopy;  Laterality: N/A;  8:45am  . POLYPECTOMY  11/10/2018   Procedure: POLYPECTOMY;  Surgeon: Daneil Dolin, MD;  Location: [facility];  Service: Endoscopy;;  colon   . TONSILLECTOMY      Current Outpatient Medications  Medication Sig Dispense Refill  . acetaminophen (TYLENOL) 500 MG tablet Take 1,000 mg by mouth as needed.     Marland Kitchen aspirin EC 325 MG tablet Take 325 mg by mouth daily.    Marland Kitchen atorvastatin (LIPITOR) 40 MG tablet Take 1 tablet by mouth daily.    . baclofen (LIORESAL) 10 MG tablet Take 10 mg by mouth 2 (two) times daily as needed.    Marland Kitchen BIOTIN PO Take by mouth daily.    . Calcium Carbonate-Vitamin D (CALTRATE 600+D PO) Take by mouth daily.    . cetirizine (ZYRTEC) 10 MG tablet Take 10 mg by mouth daily.    . cholestyramine (QUESTRAN) 4 g packet Take 1 packet (4 g total) by mouth 2 (two) times daily. (Patient taking  differently: Take 4 g by mouth as needed.) 60 each 3  . estradiol (ESTRACE) 1 MG tablet Take 1 tablet by mouth at bedtime.    . Famotidine (PEPCID AC PO) Take by mouth as needed.    . furosemide (LASIX) 40 MG tablet TAKE ONE TABLET BY MOUTH EVERY OTHER DAY (Patient taking differently: Take 40 mg by mouth daily.) 15 tablet 2  . glipiZIDE (GLUCOTROL) 10 MG tablet Take 10 mg by mouth 2 (two) times daily before a meal.    . insulin NPH-regular Human (70-30) 100 UNIT/ML injection Inject 60 Units into the skin at bedtime.     . insulin regular (NOVOLIN R) 100 units/mL injection Inject 10 Units into the skin 3 (three) times daily before meals. Doesn't take every meal    . lisinopril (PRINIVIL,ZESTRIL) 40 MG tablet Take 1 tablet by mouth daily.    . Loperamide HCl (ANTI-DIARRHEAL PO) Take by mouth as needed.    . metFORMIN (GLUCOPHAGE) 1000 MG tablet Take 1,000 mg by mouth 2 (two) times daily with a meal.    . metoprolol succinate (TOPROL-XL)  100 MG 24 hr tablet Take 100 mg by mouth daily. Take with or immediately following a meal.    . Multiple Vitamin (MULTIVITAMIN) tablet Take 1 tablet by mouth daily.    Marland Kitchen OVER THE COUNTER MEDICATION Magnesium 1 tablet by mouth once a day    . potassium chloride (K-DUR) 10 MEQ tablet TAKE ONE TABLET BY MOUTH EVERY OTHER DAY (Patient taking differently: Take 10 mEq by mouth daily.) 15 tablet 2   No current facility-administered medications for this visit.    Allergies as of 09/26/2020  . (No Known Allergies)    Family History  Problem Relation Age of Onset  . Cancer Sister        ovarian  . Diabetes Brother   . Colon cancer Brother 45       Passed from [facility] after 1 year    Social History   Socioeconomic History  . Marital status: Married    Spouse name: Not on file  . Number of children: Not on file  . Years of education: Not on file  . Highest education level: Not on file  Occupational History  . Not on file  Tobacco Use  . Smoking status: Never  Smoker  . Smokeless tobacco: Never Used  Vaping Use  . Vaping Use: Never used  Substance and Sexual Activity  . Alcohol use: No  . Drug use: No  . Sexual activity: Not Currently  Other Topics Concern  . Not on file  Social History Narrative   ** Merged History Encounter **       Social Determinants of Health   Financial Resource Strain: Not on file  Food Insecurity: Not on file  Transportation Needs: Not on file  Physical Activity: Not on file  Stress: Not on file  Social Connections: Not on file    Subjective: Review of Systems  Constitutional: Negative for chills, fever, malaise/fatigue and weight loss.  HENT: Negative for congestion and sore throat.   Respiratory: Negative for cough and shortness of breath.   Cardiovascular: Negative for chest pain and palpitations.  Gastrointestinal: Negative for abdominal pain, blood in stool, constipation, diarrhea, heartburn, melena, nausea and vomiting.  Musculoskeletal: Negative for joint pain and myalgias.  Skin: Negative for rash.  Neurological: Negative for dizziness and weakness.  Endo/Heme/Allergies: Does not bruise/bleed easily.  Psychiatric/Behavioral: Negative for depression. The patient is not nervous/anxious.   All other systems reviewed and are negative.    Objective: BP (!) 156/60   Pulse (!) 44   Temp (!) 97.3 F (36.3 C) (Temporal)   Ht 5\' 2"  (1.575 m)   Wt 262 lb 6.4 oz (119 kg)   BMI 47.99 kg/m  Physical Exam Vitals and nursing note reviewed.  Constitutional:      General: She is not in acute distress.    Appearance: Normal appearance. She is well-developed. She is obese. She is not ill-appearing, toxic-appearing or diaphoretic.  HENT:     Head: Normocephalic and atraumatic.     Nose: No congestion or rhinorrhea.  Eyes:     General: No scleral icterus. Cardiovascular:     Rate and Rhythm: Normal rate and regular rhythm.     Heart sounds: Normal heart sounds.  Pulmonary:     Effort: Pulmonary  effort is normal. No respiratory distress.     Breath sounds: Normal breath sounds.  Abdominal:     General: Bowel sounds are normal.     Palpations: Abdomen is soft. There is no hepatomegaly, splenomegaly or  mass.     Tenderness: There is no abdominal tenderness. There is no guarding or rebound.     Hernia: No hernia is present.  Skin:    General: Skin is warm and dry.     Coloration: Skin is not jaundiced.     Findings: No rash.  Neurological:     General: No focal deficit present.     Mental Status: She is alert and oriented to person, place, and time.  Psychiatric:        Attention and Perception: Attention normal.        Mood and Affect: Mood normal.        Speech: Speech normal.        Behavior: Behavior normal.        Thought Content: Thought content normal.        Cognition and Memory: Cognition and memory normal.      Assessment:  Very pleasant six 80-year-old female presents for follow-up on GERD, chronic abdominal pain, chronic diarrhea, cirrhosis.  She also informed me that her brother was diagnosed with colon cancer last year and is since passed away from his disease.  Her colonoscopy is still up-to-date, we will need to change the screening interval.  She manages her other conditions well.  I am not convinced her abdominal pain is true abdominal pain.  GERD: Currently well managed on Pepcid as needed.  She generally only needs her acid blocker if she eats something spicy.  Recommend trigger avoidance, continue medications  Chronic diarrhea: Likely multifactorial in nature.  She adequately manages her diarrhea with Questran and an antidiarrheal.  She is able to keep her symptoms pretty quiescent with this regimen.  No worsening symptoms recently.  Recommend she continue her current medications and call us for any worsening diarrhea  Cirrhosis: Historically well compensated disease, most recent meld score 7.  Likely NAFLD/NASH in etiology.  We will update her liver labs and  right upper quadrant ultrasound.  She is generally asymptomatic from a hepatic standpoint today.  She is to call us with any problems or concerning symptoms which were reviewed with her.  Chronic abdominal pain: Her pain is really more posterior lateral back pain rather than true abdominal pain.  She states it hurts when she presses on her rib in her right flank.  She does have chronic back issues and is set to see an orthopedic for follow-up on possible pinched nerve.  She states she will discuss her "abdominal pain" with the orthopedic to see if they have anything to offer.  I have asked her to let us know if her pain changes or worsens  Newly diagnosed family history of colon cancer: Her brothers was diagnosed with colon cancer last year and subsequently passing disease.  She now has a primary relative with a history of colorectal cancer.  Her colonoscopy was completed in 2020 and she was initially on recall for 2030, however we will update this at 2025 given her new family history.   Plan: 1. Change colonoscopy recall 2025 2. Cirrhosis labs including CBC, CMP, INR, AFP 3. Right upper quadrant ultrasound for hepatoma screening 4. Continue current medications 5. Call for worsening symptoms 6. Follow-up in 6 months    Thank you for allowing [place] to participate in the care of Ebony Columbia, DNP, AGNP-C Adult & Gerontological Nurse Practitioner [facility]   09/26/2020 2:34 PM   Disclaimer: This note was dictated with voice recognition software. Similar sounding  words can inadvertently be transcribed and may not be corrected upon review.

## 2020-09-26 NOTE — Patient Instructions (Signed)
Your health issues we discussed today were:   GERD (reflux/heartburn): 1. Continue taking your current medications 2. Let us know if you have any worsening or severe symptoms  Chronic diarrhea: 1. Continue taking your current medications 2. Let us know if you have any worsening diarrhea  Chronic "abdominal pain": 1. As we discussed, I do not think your abdominal pain is truly from your abdomen. 2. This may be more musculoskeletal in nature given its location and the fact that it hurts when you press on her ribs 3. Discussed this with the orthopedic when you see them for your possible pinched nerve to see if they have anything to offer 4. Call us if your pain changes or gets worse  Cirrhosis: 1. Your labs completed when you are able to 2. We will call to schedule your ultrasound for you and you will receive a call with the date and time of your exam 3. Because of the acne worsening or concerning symptoms such as yellowing of your skin or eyes, dark urine, sudden confusion, etc.  Family history of colon cancer: 1. Because your brother was recently diagnosed with colon cancer we will need to change the date of your repeat colonoscopy due 2025 2. We will send you a letter when it is time to schedule your colonoscopy 3. Because if you see any blood in your stools or had any other concerning symptoms  Overall I recommend:  1. Continue other current medications 2. Follow-up 6 months 3. Prosthetic questions or concerns   ---------------------------------------------------------------  I am glad you have gotten your COVID-19 vaccination!  Even though you are fully vaccinated you should continue to follow CDC and state/local guidelines.  ---------------------------------------------------------------   At [facility] we value your feedback. You may receive a survey about your visit today. Please share your experience as we strive to create trusting relationships with our  patients to provide genuine, compassionate, quality care.  We appreciate your understanding and patience as we review any laboratory studies, imaging, and other diagnostic tests that are ordered as we care for you. Our office policy is 5 business days for review of these results, and any emergent or urgent results are addressed in a timely manner for your best interest. If you do not hear from our office in 1 week, please contact us.   We also encourage the use of MyChart, which contains your medical information for your review as well. If you are not enrolled in this feature, an access code is on this after visit summary for your convenience. Thank you for allowing [place] to be involved in your care.  It was great to see you today!  I hope you have a Merry Christmas and Happy Holidays!!

## 2020-09-27 LAB — COMPREHENSIVE METABOLIC PANEL
AG Ratio: 1.7 (calc) (ref 1.0–2.5)
ALT: 24 U/L (ref 6–29)
AST: 28 U/L (ref 10–35)
Albumin: 4.3 g/dL (ref 3.6–5.1)
Alkaline phosphatase (APISO): 92 U/L (ref 37–153)
BUN: 19 mg/dL (ref 7–25)
CO2: 26 mmol/L (ref 20–32)
Calcium: 9.3 mg/dL (ref 8.6–10.4)
Chloride: 105 mmol/L (ref 98–110)
Creat: 0.95 mg/dL (ref 0.50–0.99)
Globulin: 2.5 g/dL (calc) (ref 1.9–3.7)
Glucose, Bld: 78 mg/dL (ref 65–99)
Potassium: 4.5 mmol/L (ref 3.5–5.3)
Sodium: 138 mmol/L (ref 135–146)
Total Bilirubin: 0.6 mg/dL (ref 0.2–1.2)
Total Protein: 6.8 g/dL (ref 6.1–8.1)

## 2020-09-27 LAB — CBC WITH DIFFERENTIAL/PLATELET
Absolute Monocytes: 281 cells/uL (ref 200–950)
Basophils Absolute: 21 cells/uL (ref 0–200)
Basophils Relative: 0.4 %
Eosinophils Absolute: 83 cells/uL (ref 15–500)
Eosinophils Relative: 1.6 %
HCT: 42.5 % (ref 35.0–45.0)
Hemoglobin: 14.1 g/dL (ref 11.7–15.5)
Lymphs Abs: 1877 cells/uL (ref 850–3900)
MCH: 29.4 pg (ref 27.0–33.0)
MCHC: 33.2 g/dL (ref 32.0–36.0)
MCV: 88.7 fL (ref 80.0–100.0)
MPV: 10.8 fL (ref 7.5–12.5)
Monocytes Relative: 5.4 %
Neutro Abs: 2938 cells/uL (ref 1500–7800)
Neutrophils Relative %: 56.5 %
Platelets: 174 10*3/uL (ref 140–400)
RBC: 4.79 10*6/uL (ref 3.80–5.10)
RDW: 13.3 % (ref 11.0–15.0)
Total Lymphocyte: 36.1 %
WBC: 5.2 10*3/uL (ref 3.8–10.8)

## 2020-09-27 LAB — PROTIME-INR
INR: 1
Prothrombin Time: 10.6 s (ref 9.0–11.5)

## 2020-09-27 LAB — AFP TUMOR MARKER: AFP-Tumor Marker: 5.3 ng/mL

## 2020-09-27 NOTE — Progress Notes (Signed)
CC'ED TO PCP 

## 2020-09-30 ENCOUNTER — Telehealth: Payer: Self-pay

## 2020-09-30 NOTE — Telephone Encounter (Signed)
[place] abd RUQ scheduled for 10/07/20 at 8:30am, arrive at 8:15am. NPO after midnight prior to test.  Called and informed pt of [place] appt. Letter mailed.

## 2020-10-07 ENCOUNTER — Ambulatory Visit (HOSPITAL_COMMUNITY)
Admission: RE | Admit: 2020-10-07 | Discharge: 2020-10-07 | Disposition: A | Discharge status: Home / Self Care | Payer: Medicare HMO | Source: Ambulatory Visit | Attending: Nurse Practitioner | Admitting: Nurse Practitioner

## 2020-10-07 ENCOUNTER — Other Ambulatory Visit: Payer: Self-pay

## 2020-10-07 DIAGNOSIS — R1084 Generalized abdominal pain: Secondary | ICD-10-CM | POA: Insufficient documentation

## 2020-10-07 DIAGNOSIS — K529 Noninfective gastroenteritis and colitis, unspecified: Secondary | ICD-10-CM | POA: Diagnosis not present

## 2020-10-07 DIAGNOSIS — Z800 Family history of malignant neoplasm of digestive organs: Secondary | ICD-10-CM | POA: Diagnosis not present

## 2020-10-07 DIAGNOSIS — K7460 Unspecified cirrhosis of liver: Secondary | ICD-10-CM | POA: Diagnosis not present

## 2020-10-07 DIAGNOSIS — K760 Fatty (change of) liver, not elsewhere classified: Secondary | ICD-10-CM | POA: Diagnosis not present

## 2020-10-07 DIAGNOSIS — K219 Gastro-esophageal reflux disease without esophagitis: Secondary | ICD-10-CM | POA: Insufficient documentation

## 2020-10-10 ENCOUNTER — Ambulatory Visit: Payer: Medicare HMO | Admitting: Nurse Practitioner

## 2020-10-14 DIAGNOSIS — M545 Low back pain, unspecified: Secondary | ICD-10-CM | POA: Diagnosis not present

## 2020-10-14 DIAGNOSIS — M4727 Other spondylosis with radiculopathy, lumbosacral region: Secondary | ICD-10-CM | POA: Diagnosis not present

## 2020-10-14 DIAGNOSIS — M5136 Other intervertebral disc degeneration, lumbar region: Secondary | ICD-10-CM | POA: Diagnosis not present

## 2020-10-16 ENCOUNTER — Other Ambulatory Visit: Payer: Self-pay | Admitting: Sports Medicine

## 2020-10-16 DIAGNOSIS — M48061 Spinal stenosis, lumbar region without neurogenic claudication: Secondary | ICD-10-CM

## 2020-10-16 DIAGNOSIS — M5416 Radiculopathy, lumbar region: Secondary | ICD-10-CM

## 2020-10-18 DIAGNOSIS — K760 Fatty (change of) liver, not elsewhere classified: Secondary | ICD-10-CM | POA: Diagnosis not present

## 2020-10-18 DIAGNOSIS — R197 Diarrhea, unspecified: Secondary | ICD-10-CM | POA: Diagnosis not present

## 2020-10-18 DIAGNOSIS — K219 Gastro-esophageal reflux disease without esophagitis: Secondary | ICD-10-CM | POA: Diagnosis not present

## 2020-10-18 DIAGNOSIS — Z0000 Encounter for general adult medical examination without abnormal findings: Secondary | ICD-10-CM | POA: Diagnosis not present

## 2020-10-18 DIAGNOSIS — G4733 Obstructive sleep apnea (adult) (pediatric): Secondary | ICD-10-CM | POA: Diagnosis not present

## 2020-10-18 DIAGNOSIS — M25551 Pain in right hip: Secondary | ICD-10-CM | POA: Diagnosis not present

## 2020-10-18 DIAGNOSIS — Z0001 Encounter for general adult medical examination with abnormal findings: Secondary | ICD-10-CM | POA: Diagnosis not present

## 2020-10-18 DIAGNOSIS — E1165 Type 2 diabetes mellitus with hyperglycemia: Secondary | ICD-10-CM | POA: Diagnosis not present

## 2020-10-18 DIAGNOSIS — N1831 Chronic kidney disease, stage 3a: Secondary | ICD-10-CM | POA: Diagnosis not present

## 2020-10-22 DIAGNOSIS — I129 Hypertensive chronic kidney disease with stage 1 through stage 4 chronic kidney disease, or unspecified chronic kidney disease: Secondary | ICD-10-CM | POA: Diagnosis not present

## 2020-10-22 DIAGNOSIS — Z6841 Body Mass Index (BMI) 40.0 and over, adult: Secondary | ICD-10-CM | POA: Diagnosis not present

## 2020-10-22 DIAGNOSIS — N060 Isolated proteinuria with minor glomerular abnormality: Secondary | ICD-10-CM | POA: Diagnosis not present

## 2020-10-22 DIAGNOSIS — N951 Menopausal and female climacteric states: Secondary | ICD-10-CM | POA: Diagnosis not present

## 2020-10-22 DIAGNOSIS — E1122 Type 2 diabetes mellitus with diabetic chronic kidney disease: Secondary | ICD-10-CM | POA: Diagnosis not present

## 2020-10-22 DIAGNOSIS — I5032 Chronic diastolic (congestive) heart failure: Secondary | ICD-10-CM | POA: Diagnosis not present

## 2020-10-22 DIAGNOSIS — R600 Localized edema: Secondary | ICD-10-CM | POA: Diagnosis not present

## 2020-10-22 DIAGNOSIS — G4733 Obstructive sleep apnea (adult) (pediatric): Secondary | ICD-10-CM | POA: Diagnosis not present

## 2020-11-03 ENCOUNTER — Ambulatory Visit
Admission: RE | Admit: 2020-11-03 | Discharge: 2020-11-03 | Disposition: A | Discharge status: Home / Self Care | Payer: Medicare HMO | Source: Ambulatory Visit | Attending: Sports Medicine | Admitting: Sports Medicine

## 2020-11-03 ENCOUNTER — Other Ambulatory Visit: Payer: Self-pay

## 2020-11-03 DIAGNOSIS — M5416 Radiculopathy, lumbar region: Secondary | ICD-10-CM

## 2020-11-03 DIAGNOSIS — M545 Low back pain, unspecified: Secondary | ICD-10-CM | POA: Diagnosis not present

## 2020-11-03 DIAGNOSIS — M48061 Spinal stenosis, lumbar region without neurogenic claudication: Secondary | ICD-10-CM

## 2020-11-07 DIAGNOSIS — M545 Low back pain, unspecified: Secondary | ICD-10-CM | POA: Diagnosis not present

## 2020-11-12 DIAGNOSIS — M5416 Radiculopathy, lumbar region: Secondary | ICD-10-CM | POA: Diagnosis not present

## 2020-11-15 DIAGNOSIS — R001 Bradycardia, unspecified: Secondary | ICD-10-CM | POA: Diagnosis not present

## 2020-11-15 DIAGNOSIS — L03213 Periorbital cellulitis: Secondary | ICD-10-CM | POA: Diagnosis not present

## 2020-11-19 DIAGNOSIS — R001 Bradycardia, unspecified: Secondary | ICD-10-CM | POA: Diagnosis not present

## 2020-11-19 DIAGNOSIS — H02843 Edema of right eye, unspecified eyelid: Secondary | ICD-10-CM | POA: Diagnosis not present

## 2020-11-25 DIAGNOSIS — R001 Bradycardia, unspecified: Secondary | ICD-10-CM | POA: Diagnosis not present

## 2020-11-25 DIAGNOSIS — H02843 Edema of right eye, unspecified eyelid: Secondary | ICD-10-CM | POA: Diagnosis not present

## 2020-11-25 DIAGNOSIS — G4733 Obstructive sleep apnea (adult) (pediatric): Secondary | ICD-10-CM | POA: Diagnosis not present

## 2020-11-27 DIAGNOSIS — M5416 Radiculopathy, lumbar region: Secondary | ICD-10-CM | POA: Diagnosis not present

## 2020-12-02 DIAGNOSIS — M5416 Radiculopathy, lumbar region: Secondary | ICD-10-CM | POA: Diagnosis not present

## 2020-12-16 DIAGNOSIS — M5416 Radiculopathy, lumbar region: Secondary | ICD-10-CM | POA: Diagnosis not present

## 2020-12-24 DIAGNOSIS — M5416 Radiculopathy, lumbar region: Secondary | ICD-10-CM | POA: Diagnosis not present

## 2021-01-03 DIAGNOSIS — M5416 Radiculopathy, lumbar region: Secondary | ICD-10-CM | POA: Diagnosis not present

## 2021-01-08 DIAGNOSIS — I10 Essential (primary) hypertension: Secondary | ICD-10-CM | POA: Diagnosis not present

## 2021-01-08 DIAGNOSIS — E1165 Type 2 diabetes mellitus with hyperglycemia: Secondary | ICD-10-CM | POA: Diagnosis not present

## 2021-01-21 ENCOUNTER — Encounter: Payer: Self-pay | Admitting: Internal Medicine

## 2021-02-09 DIAGNOSIS — M545 Low back pain, unspecified: Secondary | ICD-10-CM | POA: Diagnosis not present

## 2021-02-09 DIAGNOSIS — R800 Isolated proteinuria: Secondary | ICD-10-CM | POA: Diagnosis not present

## 2021-02-09 DIAGNOSIS — I5032 Chronic diastolic (congestive) heart failure: Secondary | ICD-10-CM | POA: Diagnosis not present

## 2021-02-09 DIAGNOSIS — K219 Gastro-esophageal reflux disease without esophagitis: Secondary | ICD-10-CM | POA: Diagnosis not present

## 2021-02-09 DIAGNOSIS — E1165 Type 2 diabetes mellitus with hyperglycemia: Secondary | ICD-10-CM | POA: Diagnosis not present

## 2021-02-09 DIAGNOSIS — N959 Unspecified menopausal and perimenopausal disorder: Secondary | ICD-10-CM | POA: Diagnosis not present

## 2021-02-09 DIAGNOSIS — E782 Mixed hyperlipidemia: Secondary | ICD-10-CM | POA: Diagnosis not present

## 2021-02-09 DIAGNOSIS — R600 Localized edema: Secondary | ICD-10-CM | POA: Diagnosis not present

## 2021-02-09 DIAGNOSIS — I129 Hypertensive chronic kidney disease with stage 1 through stage 4 chronic kidney disease, or unspecified chronic kidney disease: Secondary | ICD-10-CM | POA: Diagnosis not present

## 2021-02-09 DIAGNOSIS — K760 Fatty (change of) liver, not elsewhere classified: Secondary | ICD-10-CM | POA: Diagnosis not present

## 2021-02-13 ENCOUNTER — Encounter: Payer: Self-pay | Admitting: Gastroenterology

## 2021-02-24 DIAGNOSIS — G4733 Obstructive sleep apnea (adult) (pediatric): Secondary | ICD-10-CM | POA: Diagnosis not present

## 2021-03-04 DIAGNOSIS — E1165 Type 2 diabetes mellitus with hyperglycemia: Secondary | ICD-10-CM | POA: Diagnosis not present

## 2021-03-04 DIAGNOSIS — I10 Essential (primary) hypertension: Secondary | ICD-10-CM | POA: Diagnosis not present

## 2021-03-06 DIAGNOSIS — M545 Low back pain, unspecified: Secondary | ICD-10-CM | POA: Diagnosis not present

## 2021-03-06 DIAGNOSIS — I129 Hypertensive chronic kidney disease with stage 1 through stage 4 chronic kidney disease, or unspecified chronic kidney disease: Secondary | ICD-10-CM | POA: Diagnosis not present

## 2021-03-06 DIAGNOSIS — R800 Isolated proteinuria: Secondary | ICD-10-CM | POA: Diagnosis not present

## 2021-03-06 DIAGNOSIS — E782 Mixed hyperlipidemia: Secondary | ICD-10-CM | POA: Diagnosis not present

## 2021-03-06 DIAGNOSIS — R600 Localized edema: Secondary | ICD-10-CM | POA: Diagnosis not present

## 2021-03-06 DIAGNOSIS — E1165 Type 2 diabetes mellitus with hyperglycemia: Secondary | ICD-10-CM | POA: Diagnosis not present

## 2021-03-06 DIAGNOSIS — I5032 Chronic diastolic (congestive) heart failure: Secondary | ICD-10-CM | POA: Diagnosis not present

## 2021-03-06 DIAGNOSIS — N959 Unspecified menopausal and perimenopausal disorder: Secondary | ICD-10-CM | POA: Diagnosis not present

## 2021-03-06 DIAGNOSIS — K760 Fatty (change of) liver, not elsewhere classified: Secondary | ICD-10-CM | POA: Diagnosis not present

## 2021-03-27 ENCOUNTER — Ambulatory Visit: Payer: Medicare HMO | Admitting: Nurse Practitioner

## 2021-04-02 ENCOUNTER — Ambulatory Visit: Payer: Medicare HMO | Admitting: Gastroenterology

## 2021-04-10 DIAGNOSIS — K219 Gastro-esophageal reflux disease without esophagitis: Secondary | ICD-10-CM | POA: Diagnosis not present

## 2021-04-10 DIAGNOSIS — E1165 Type 2 diabetes mellitus with hyperglycemia: Secondary | ICD-10-CM | POA: Diagnosis not present

## 2021-04-11 DIAGNOSIS — H40013 Open angle with borderline findings, low risk, bilateral: Secondary | ICD-10-CM | POA: Diagnosis not present

## 2021-04-11 DIAGNOSIS — E133293 Other specified diabetes mellitus with mild nonproliferative diabetic retinopathy without macular edema, bilateral: Secondary | ICD-10-CM | POA: Diagnosis not present

## 2021-04-11 DIAGNOSIS — H25813 Combined forms of age-related cataract, bilateral: Secondary | ICD-10-CM | POA: Diagnosis not present

## 2021-04-11 DIAGNOSIS — H5203 Hypermetropia, bilateral: Secondary | ICD-10-CM | POA: Diagnosis not present

## 2021-05-15 DIAGNOSIS — H40013 Open angle with borderline findings, low risk, bilateral: Secondary | ICD-10-CM | POA: Diagnosis not present

## 2021-05-21 ENCOUNTER — Encounter: Payer: Self-pay | Admitting: Internal Medicine

## 2021-05-22 ENCOUNTER — Other Ambulatory Visit (HOSPITAL_COMMUNITY): Payer: Self-pay | Admitting: Internal Medicine

## 2021-05-22 DIAGNOSIS — Z1231 Encounter for screening mammogram for malignant neoplasm of breast: Secondary | ICD-10-CM

## 2021-05-26 DIAGNOSIS — G4733 Obstructive sleep apnea (adult) (pediatric): Secondary | ICD-10-CM | POA: Diagnosis not present

## 2021-05-28 ENCOUNTER — Ambulatory Visit (HOSPITAL_COMMUNITY)
Admission: RE | Admit: 2021-05-28 | Discharge: 2021-05-28 | Disposition: A | Discharge status: Home / Self Care | Payer: Medicare HMO | Source: Ambulatory Visit | Attending: Internal Medicine | Admitting: Internal Medicine

## 2021-05-28 ENCOUNTER — Other Ambulatory Visit: Payer: Self-pay

## 2021-05-28 DIAGNOSIS — Z1231 Encounter for screening mammogram for malignant neoplasm of breast: Secondary | ICD-10-CM | POA: Insufficient documentation

## 2021-06-04 ENCOUNTER — Other Ambulatory Visit (HOSPITAL_COMMUNITY): Payer: Self-pay | Admitting: Internal Medicine

## 2021-06-04 DIAGNOSIS — R928 Other abnormal and inconclusive findings on diagnostic imaging of breast: Secondary | ICD-10-CM

## 2021-06-19 DIAGNOSIS — I10 Essential (primary) hypertension: Secondary | ICD-10-CM | POA: Diagnosis not present

## 2021-06-19 DIAGNOSIS — E1165 Type 2 diabetes mellitus with hyperglycemia: Secondary | ICD-10-CM | POA: Diagnosis not present

## 2021-06-20 ENCOUNTER — Ambulatory Visit: Payer: Medicare HMO | Admitting: Gastroenterology

## 2021-06-22 NOTE — Progress Notes (Signed)
Referring Provider: Celene Squibb, MD Primary Care Physician:  Celene Squibb, MD Primary GI Physician: Dr. Gala Romney  Chief Complaint  Patient presents with   Gastroesophageal Reflux    "About the same"   Abdominal Pain    Mid lower abd when constipated   Cirrhosis   Diarrhea   Constipation    HPI:   Ebony Stevens is a 70 y.o. female presenting today with a history of cirrhosis (F3+F4 on elastography 2020) thought to be secondary to NAFLD/NASH with extensive serologic work-up benign, evidence of liver lesion on MRI years ago at [facility], [place] in May 2019 with hepatic steatosis, hepatomegaly, and area of chronic volume loss in left liver lobe with new calcification and pneumobilia within likely due to evolution of remote insult related to prior cholecystectomy, AFP normal. Also with history of GERD, chronic diarrhea previously managed with Questran and antidiarrheals, celiac's screening negative and random colon biopsies benign, presenting today for routine follow-up.  Colonoscopy up-to-date in 2020 with 1 hyperplastic polyp, random colon biopsies benign, due for repeat in 2025 due to family history of colon cancer. No prior EGD  Of note, patient had followed at [facility] many years ago (see office visit 01/18/2019 for record details)- During procedure for cholecystectomy, it was noted that she had nodularity and fatty appearance of the liver.  Per clinical notes, biopsies were consistent with fatty infiltrates, steatohepatitis.  Some question of possible toxic effect.  Biopsies of right and left lobes show different levels of disease activity.  Unable to obtain biopsy and records as procedure was done prior to 2007.    Last seen in our office 09/25/2020.  Chronic diarrhea fairly well managed with Questran and antidiarrheal.  GERD doing well on Pepcid as needed.  Continues with chronic right-sided/more posterolateral back pain with plans to see an orthopedic soon.  Reported brother recently passed from  colon cancer.  Recommended interval change in screening interval to 5 years. Routine cirrhosis labs and [place] screening arranged.    Today:  Cirrhosis: Occasional swelling in her legs/ankles, but nothing persistent. No confusion, yellowing of the eyes or skin. No bruising or bleeding.   Hepatitis B vaccine-completed Hepatitis A vaccine: Unknown immunity status.  Variceal screening: No prior EGD.  [place] screening: Ultrasound December 2021: Chronic hepatic steatosis and left lobe scarring with dystrophic calcification, no suspicious liver lesion. Cirrhosis related medications: On Lasix, previously prescribed by cardiology. Labs: December 2021 corresponding to MELD 6 and Child Pugh A.    GERD: Taking pepcid PRN. Well controlled. May not have to take it for up to a week. Not more than twice a week.  Triggers include spicy foods.  Denies dysphagia, nausea, vomiting.  Constipation/Diarrhea: Can have both in 1 day. Mid lower abdominal pain when constipated that improves with bowel movements.  Ran out of Questran in April/May. Too expensive and would cause constipation even with once a day dosing. Controlling diarrhea with Loperamide. Only takes when diarrhea is bad, 2-3 days a week. Following Loperamide may feel a little constipated. Doesn't skip days between BMs. BMs usually after eating, 1-4 per day. Watery stools intermittently with urgency. Occasional nocturnal stools. No blood in the stools or black stools.   Diarrhea has been present for years. Started after having gallbladder removed. Fatty meals worsen diarrhea. Eats fast food often. States she doesn't do anything else like alcohol or drugs and this is her one bad habit.    Continues with unchanged chronic right flank pain. Random. Can  be every few weeks. Last a couple day or a few hours. Has a pinched her in her back. Will take up to 1000 mg tylenol per day if needed, but trying to limit this.  Does not take Tylenol daily.  HR 48 today.  Reports heart rate is always low.  Typically in the 45s.  Denies chest pain, lightheadedness, dizziness, presyncope, syncope.  Past Medical History:  Diagnosis Date   Asthma    Chest pain 07/11/2015   Diabetes mellitus without complication ([place])    Dyspnea 07/11/2015   Early cirrhosis ([place]) 2020   Excessive bleeding in the premenopausal period    Fibromyalgia    GERD (gastroesophageal reflux disease)    HTN (hypertension)    Hypertension    Localized edema    Mixed hyperlipidemia    Type 2 diabetes mellitus ([place])     Past Surgical History:  Procedure Laterality Date   ABDOMINAL HYSTERECTOMY     1975 partial hysterectomy,  1978 "complete hysterctomy and retack bladder". states it didn't take.    BIOPSY  11/10/2018   Procedure: BIOPSY;  Surgeon: Daneil Dolin, MD;  Location: [facility];  Service: Endoscopy;;  colon    CHOLECYSTECTOMY     COLONOSCOPY WITH PROPOFOL N/A 11/10/2018   Surgeon: Daneil Dolin, MD;  1 hyperplastic polyp, random colon biopsies benign, due for repeat in 2025 due to family history of colon cancer.   POLYPECTOMY  11/10/2018   Procedure: POLYPECTOMY;  Surgeon: Daneil Dolin, MD;  Location: [facility];  Service: Endoscopy;;  colon    TONSILLECTOMY      Current Outpatient Medications  Medication Sig Dispense Refill   acetaminophen (TYLENOL) 500 MG tablet Take 1,000 mg by mouth as needed.      aspirin EC 325 MG tablet Take 325 mg by mouth daily.     atorvastatin (LIPITOR) 40 MG tablet Take 1 tablet by mouth daily.     Calcium Carbonate-Vitamin D (CALTRATE 600+D PO) Take by mouth daily.     cetirizine (ZYRTEC) 10 MG tablet Take 10 mg by mouth daily.     estradiol (ESTRACE) 1 MG tablet Take 1 tablet by mouth at bedtime.     Famotidine (PEPCID AC PO) Take by mouth as needed.     furosemide (LASIX) 40 MG tablet TAKE ONE TABLET BY MOUTH EVERY OTHER DAY (Patient taking differently: No sig reported) 15 tablet 2   glipiZIDE (GLUCOTROL) 10 MG tablet  Take 10 mg by mouth 2 (two) times daily before a meal.     insulin NPH-regular Human (70-30) 100 UNIT/ML injection Inject 60 Units into the skin at bedtime.      insulin regular (NOVOLIN R) 100 units/mL injection Inject 10 Units into the skin 3 (three) times daily before meals. Doesn't take every meal     lisinopril (PRINIVIL,ZESTRIL) 40 MG tablet Take 1 tablet by mouth daily.     Loperamide HCl (ANTI-DIARRHEAL PO) Take by mouth as needed.     metFORMIN (GLUCOPHAGE) 1000 MG tablet Take 1,000 mg by mouth 2 (two) times daily with a meal.     metoprolol succinate (TOPROL-XL) 100 MG 24 hr tablet Take 100 mg by mouth daily. Take with or immediately following a meal.     Multiple Vitamin (MULTIVITAMIN) tablet Take 1 tablet by mouth daily.     OVER THE COUNTER MEDICATION Magnesium 1 tablet by mouth once a day     potassium chloride (K-DUR) 10 MEQ tablet TAKE ONE TABLET BY  MOUTH EVERY OTHER DAY (Patient taking differently: Take 10 mEq by mouth daily.) 15 tablet 2   No current facility-administered medications for this visit.    Allergies as of 06/23/2021   (No Known Allergies)    Family History  Problem Relation Age of Onset   Cancer Sister        ovarian   Diabetes Brother    Colon cancer Brother 58       Passed from [facility] after 1 year    Social History   Socioeconomic History   Marital status: Married    Spouse name: Not on file   Number of children: Not on file   Years of education: Not on file   Highest education level: Not on file  Occupational History   Not on file  Tobacco Use   Smoking status: Never   Smokeless tobacco: Never  Vaping Use   Vaping Use: Never used  Substance and Sexual Activity   Alcohol use: No   Drug use: No   Sexual activity: Not Currently  Other Topics Concern   Not on file  Social History Narrative   ** Merged History Encounter **       Social Determinants of Health   Financial Resource Strain: Not on file  Food Insecurity: Not on file   Transportation Needs: Not on file  Physical Activity: Not on file  Stress: Not on file  Social Connections: Not on file    Review of Systems: Gen: Denies fever, chills, cold or flu like symptoms, pre syncope or syncope.  CV: Denies chest pain, palpitations. Resp: Denies dyspnea or cough.  GI: See HPI Heme: See HPI  Physical Exam: BP 129/70   Pulse (!) 48   Temp (!) 97.3 F (36.3 C) (Temporal)   Ht '5\' 2"'$  (1.575 m)   Wt 269 lb 12.8 oz (122.4 kg)   BMI 49.35 kg/m  General:   Alert and oriented. No distress noted. Pleasant and cooperative.  Head:  Normocephalic and atraumatic. Eyes:  Conjuctiva clear without scleral icterus. Heart:  S1, S2 present without murmurs appreciated. Lungs:  Clear to auscultation bilaterally. No wheezes, rales, or rhonchi. No distress.  Abdomen:  +BS, soft, non-tender and non-distended. No rebound or guarding. No HSM or masses noted. Msk:  Symmetrical without gross deformities. Normal posture. Extremities:  Without edema. Neurologic:  Alert and  oriented x4 Psych: Normal mood and affect.    Assessment:  70 y.o. female presenting today with a history of cirrhosis (F3+F4 on elastography 2020) thought to be secondary to NAFLD/NASH with extensive serologic work-up benign, evidence of liver lesion on MRI years ago at [facility], [place] in May 2019 with hepatic steatosis, hepatomegaly, and area of chronic volume loss in left liver lobe with new calcification and pneumobilia within likely due to evolution of remote insult related to prior cholecystectomy, AFP normal. Also with history of GERD, chronic diarrhea previously managed with Questran and antidiarrheals, celiac's screening negative and random colon biopsies benign, presenting today for routine follow-up.  Cirrhosis: Secondary to NAFLD/NASH. MELD 6, Child Pugh A based on labs December 2021.  LFTs, platelets, INR within normal limits.  Clinically she remains well compensated. She is due for routine labs, [place]  screening, and has never had an EGD. She has completed Hep B vaccination. Unknown Hep A immunity status.   GERD: Well controlled with avoiding dietary triggers and pepcid PRN. No alarm symptoms.   Diarrhea: Chronic. Postprandial. Likely secondary to bile salt diarrhea with history of cholecystectomy  and poor diet (frequent fast food/high fat diet). Fairly well managed with Loperamide. Questran too expensive and caused constipation.  She does report intermittent nocturnal stools. No stool studies on file. Overall, suspect bile salt diarrhea compounded by poor diet likely primary driver of diarrhea, but would like to rule out underlying infectious diarrhea and check TSH for completeness.  Otherwise, continue to manage with Loperamide.   Bradycardia: Heart rate 48 today, on metoprolol.  Asymptomatic.  Recommended patient reach out to PCP today as medication may need to be adjusted.  Plan:  CBC, CMP, INR, AFP, Hep A Ab total, C diff GDH and Toxin A/B, GI pathogen panel, TSH.  RUQ [place]  EGD with propofol with Dr. Gala Romney in the near future. The risks, benefits, and alternatives have been discussed with the patient in detail. The patient states understanding and desires to proceed. ASA III 1/2 dose of Insulin NPH at bedtime. Hold all AM diabetes medications morning of procedure.  2g sodium diet.  Max 2000 mg Tylenol per 24 hours.  Counseled on fatty liver. Written instructions provided.  Advised to monitor for swelling in lower extremities or abdomen, disorientation/confusion, yellowing of the eyes or skin, bruising/bleeding and let us know if this occurs. Strict low fat diet to prevent diarrhea and avoid fast food.  Continue imodium as needed.  Continue Pepcid as needed. Patient is to call PCP today to discuss bradycardia/metoprolol. Follow-up in 6 months.    Aliene Altes, PA-C [facility] 06/23/2021

## 2021-06-23 ENCOUNTER — Encounter: Payer: Self-pay | Admitting: Gastroenterology

## 2021-06-23 ENCOUNTER — Ambulatory Visit: Payer: Medicare HMO | Admitting: Gastroenterology

## 2021-06-23 ENCOUNTER — Other Ambulatory Visit: Payer: Self-pay

## 2021-06-23 ENCOUNTER — Telehealth: Payer: Self-pay

## 2021-06-23 ENCOUNTER — Encounter: Payer: Self-pay | Admitting: Internal Medicine

## 2021-06-23 VITALS — BP 129/70 | HR 48 | Temp 97.3°F | Ht 62.0 in | Wt 269.8 lb

## 2021-06-23 DIAGNOSIS — R197 Diarrhea, unspecified: Secondary | ICD-10-CM | POA: Diagnosis not present

## 2021-06-23 DIAGNOSIS — K219 Gastro-esophageal reflux disease without esophagitis: Secondary | ICD-10-CM

## 2021-06-23 DIAGNOSIS — R001 Bradycardia, unspecified: Secondary | ICD-10-CM

## 2021-06-23 DIAGNOSIS — K7460 Unspecified cirrhosis of liver: Secondary | ICD-10-CM | POA: Diagnosis not present

## 2021-06-23 NOTE — Telephone Encounter (Signed)
Will call pt to schedule EGD w/Propofol ASA 3 w/Dr. Gala Romney when next schedule is available. No availability for morning procedure at this time, pt is diabetic.

## 2021-06-23 NOTE — Patient Instructions (Addendum)
Please call your primary care doctor today to let them know your heart rate is slow, 48 today.  I suspect your metoprolol needs to be decreased.  Please have blood work and stool studies completed at [facility].  We will arrange for you to have an ultrasound of your liver.  We will arrange for you to have an upper endoscopy in the near future with Dr. Gala Romney The night prior to your procedure, take one half dose of insulin NPH (30 units). Day of your procedure, do not take any morning diabetes medications.  As we discussed, is important that she follow a strict low-sodium diet.  No more than 2000 mg of sodium daily.  This includes everything that you eat and drink.  You may take Tylenol if needed.  No more than 2000 mg of Tylenol per day.  Instructions for fatty liver: Recommend 1-2# weight loss per week until ideal body weight through exercise & diet. Low fat/cholesterol diet.   Avoid sweets, sodas, fruit juices, sweetened beverages like tea, etc. Gradually increase exercise from 15 min daily up to 1 hr per day 5 days/week. Limit alcohol use.  Monitor for increased swelling in your lower extremities, swelling of your abdomen, disorientation/confusion, yellowing of the eyes or skin, bruising/bleeding and let us know if this occurs.  For diarrhea: Follow a low-fat diet.  Avoid fried, fatty, greasy items. Avoid fast food. All meats should be baked, boiled, or broiled. You may continue to use Imodium as needed.  Continue taking Pepcid as needed for reflux symptoms.  We will plan to follow-up with you in the office in 6 months.  Do not hesitate to call if you have any questions or concerns prior to your next visit.   It was a pleasure meeting you today!  Aliene Altes, PA-C [facility]

## 2021-06-24 ENCOUNTER — Telehealth: Payer: Self-pay

## 2021-06-24 LAB — COMPLETE METABOLIC PANEL WITH GFR
AG Ratio: 1.6 (calc) (ref 1.0–2.5)
ALT: 19 U/L (ref 6–29)
AST: 24 U/L (ref 10–35)
Albumin: 4.1 g/dL (ref 3.6–5.1)
Alkaline phosphatase (APISO): 93 U/L (ref 37–153)
BUN: 15 mg/dL (ref 7–25)
CO2: 26 mmol/L (ref 20–32)
Calcium: 9.2 mg/dL (ref 8.6–10.4)
Chloride: 106 mmol/L (ref 98–110)
Creat: 0.99 mg/dL (ref 0.50–1.05)
Globulin: 2.5 g/dL (calc) (ref 1.9–3.7)
Glucose, Bld: 120 mg/dL — ABNORMAL HIGH (ref 65–99)
Potassium: 4.7 mmol/L (ref 3.5–5.3)
Sodium: 140 mmol/L (ref 135–146)
Total Bilirubin: 0.5 mg/dL (ref 0.2–1.2)
Total Protein: 6.6 g/dL (ref 6.1–8.1)
eGFR: 62 mL/min/{1.73_m2} (ref >=60)

## 2021-06-24 LAB — CBC WITH DIFFERENTIAL/PLATELET
Absolute Monocytes: 319 cells/uL (ref 200–950)
Basophils Absolute: 18 cells/uL (ref 0–200)
Basophils Relative: 0.3 %
Eosinophils Absolute: 83 cells/uL (ref 15–500)
Eosinophils Relative: 1.4 %
HCT: 41.9 % (ref 35.0–45.0)
Hemoglobin: 13.8 g/dL (ref 11.7–15.5)
Lymphs Abs: 1505 cells/uL (ref 850–3900)
MCH: 30.3 pg (ref 27.0–33.0)
MCHC: 32.9 g/dL (ref 32.0–36.0)
MCV: 91.9 fL (ref 80.0–100.0)
MPV: 10.9 fL (ref 7.5–12.5)
Monocytes Relative: 5.4 %
Neutro Abs: 3977 cells/uL (ref 1500–7800)
Neutrophils Relative %: 67.4 %
Platelets: 181 10*3/uL (ref 140–400)
RBC: 4.56 10*6/uL (ref 3.80–5.10)
RDW: 12.8 % (ref 11.0–15.0)
Total Lymphocyte: 25.5 %
WBC: 5.9 10*3/uL (ref 3.8–10.8)

## 2021-06-24 LAB — AFP TUMOR MARKER: AFP-Tumor Marker: 4.6 ng/mL

## 2021-06-24 LAB — PROTIME-INR
INR: 1
Prothrombin Time: 9.8 s (ref 9.0–11.5)

## 2021-06-24 LAB — TSH: TSH: 3.55 mIU/L (ref 0.40–4.50)

## 2021-06-24 LAB — HEPATITIS A ANTIBODY, TOTAL: Hepatitis A AB,Total: NONREACTIVE

## 2021-06-24 NOTE — Telephone Encounter (Signed)
[place] abd RUQ scheduled for 07/03/21 at 9:30am, arrive at 9:15am. NPO after midnight prior to test.  Called and informed pt of [place] appt. Letter mailed.

## 2021-06-25 DIAGNOSIS — K529 Noninfective gastroenteritis and colitis, unspecified: Secondary | ICD-10-CM | POA: Diagnosis not present

## 2021-06-25 DIAGNOSIS — R197 Diarrhea, unspecified: Secondary | ICD-10-CM | POA: Diagnosis not present

## 2021-06-25 DIAGNOSIS — K7460 Unspecified cirrhosis of liver: Secondary | ICD-10-CM | POA: Diagnosis not present

## 2021-06-26 DIAGNOSIS — I5032 Chronic diastolic (congestive) heart failure: Secondary | ICD-10-CM | POA: Diagnosis not present

## 2021-06-26 DIAGNOSIS — R600 Localized edema: Secondary | ICD-10-CM | POA: Diagnosis not present

## 2021-06-26 DIAGNOSIS — M545 Low back pain, unspecified: Secondary | ICD-10-CM | POA: Diagnosis not present

## 2021-06-26 DIAGNOSIS — Z0001 Encounter for general adult medical examination with abnormal findings: Secondary | ICD-10-CM | POA: Diagnosis not present

## 2021-06-26 DIAGNOSIS — E1165 Type 2 diabetes mellitus with hyperglycemia: Secondary | ICD-10-CM | POA: Diagnosis not present

## 2021-06-26 DIAGNOSIS — N3281 Overactive bladder: Secondary | ICD-10-CM | POA: Diagnosis not present

## 2021-06-26 DIAGNOSIS — E782 Mixed hyperlipidemia: Secondary | ICD-10-CM | POA: Diagnosis not present

## 2021-06-26 DIAGNOSIS — R800 Isolated proteinuria: Secondary | ICD-10-CM | POA: Diagnosis not present

## 2021-06-26 DIAGNOSIS — I129 Hypertensive chronic kidney disease with stage 1 through stage 4 chronic kidney disease, or unspecified chronic kidney disease: Secondary | ICD-10-CM | POA: Diagnosis not present

## 2021-06-27 ENCOUNTER — Encounter: Payer: Self-pay | Admitting: Gastroenterology

## 2021-06-29 IMAGING — US US ABDOMEN LIMITED
1 series · 14 of 25 positions shown · non-contrast
Comparison: CT Abdomen and Pelvis 02/28/2018. Ultrasound
04/17/2020.

CLINICAL DATA: 69-year-old female with cirrhosis.

EXAM:
ULTRASOUND ABDOMEN LIMITED RIGHT UPPER QUADRANT

[Series 1: us abdomen limited ruq (liver/gb) · 14 of 45 slices shown]
[im 1/45]
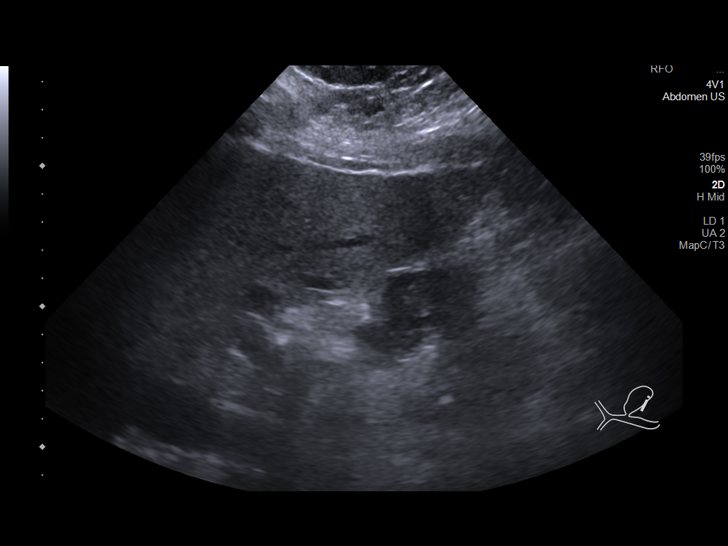
[im 4/45]
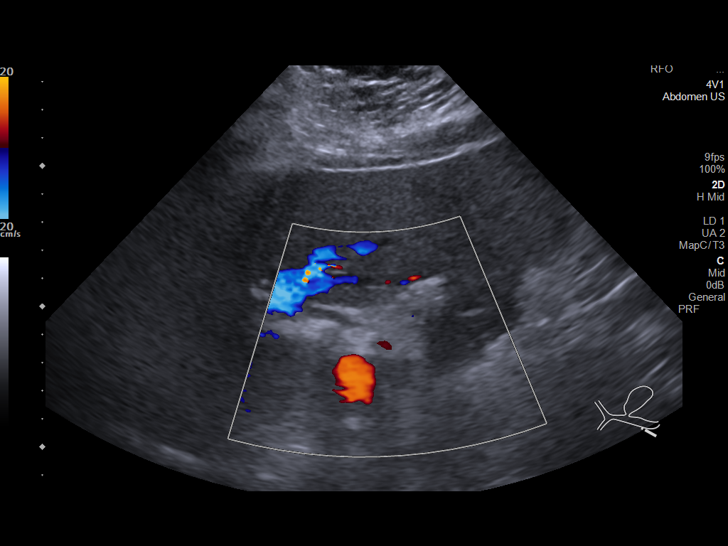
[im 8/45]
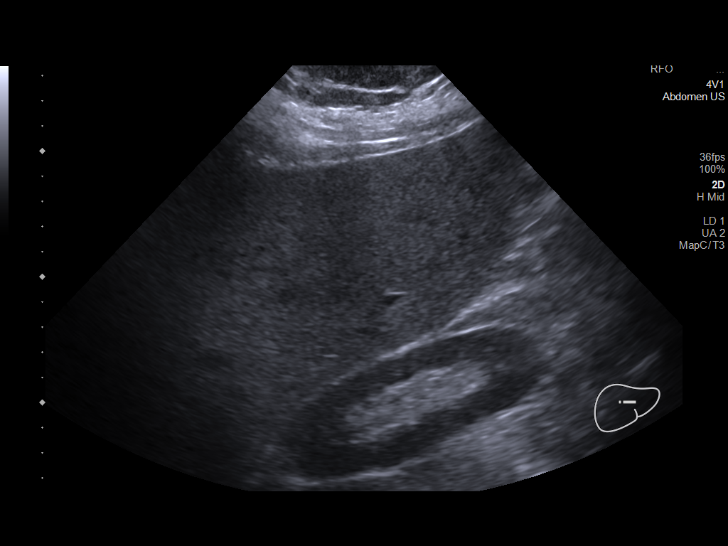
[im 12/45]
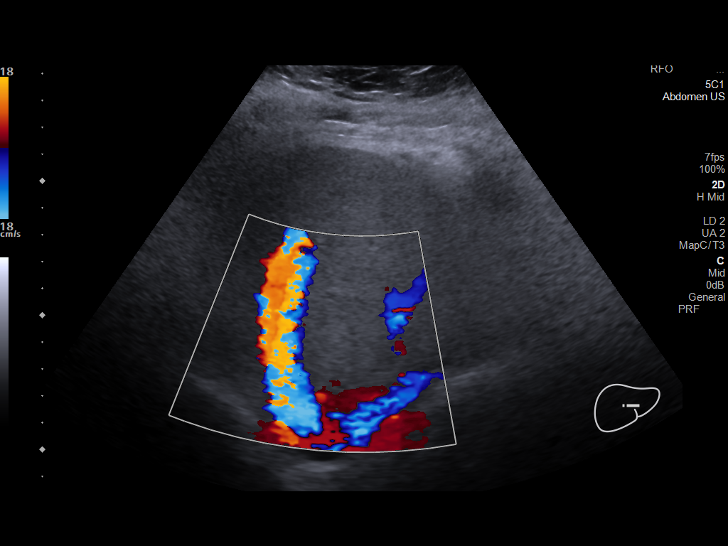
[im 15/45]
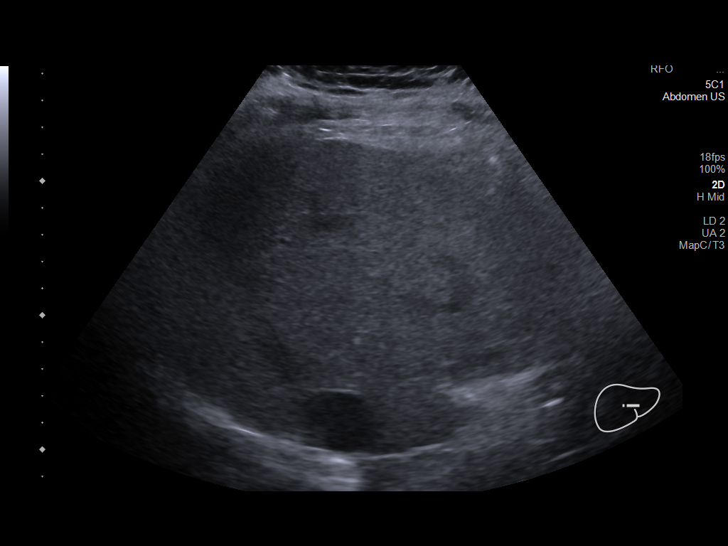
[im 17/45]
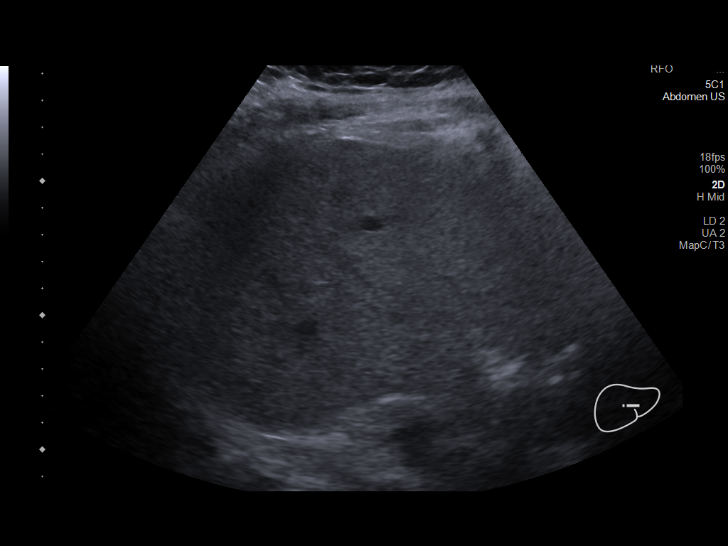
[im 21/45]
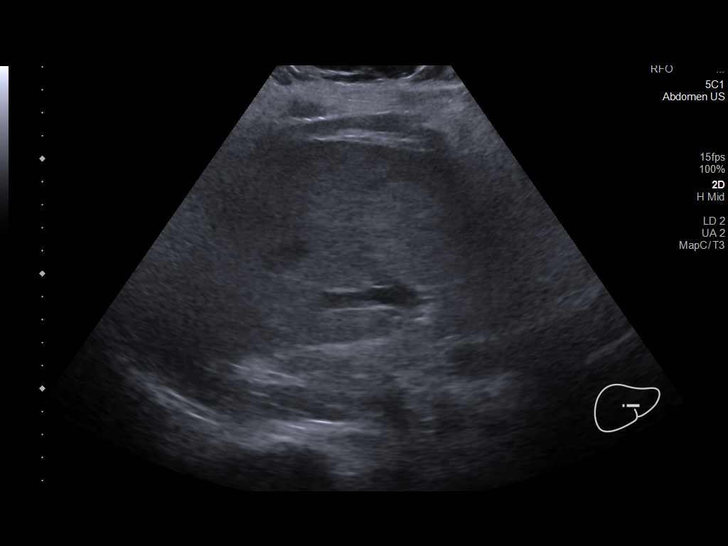
[im 24/45]
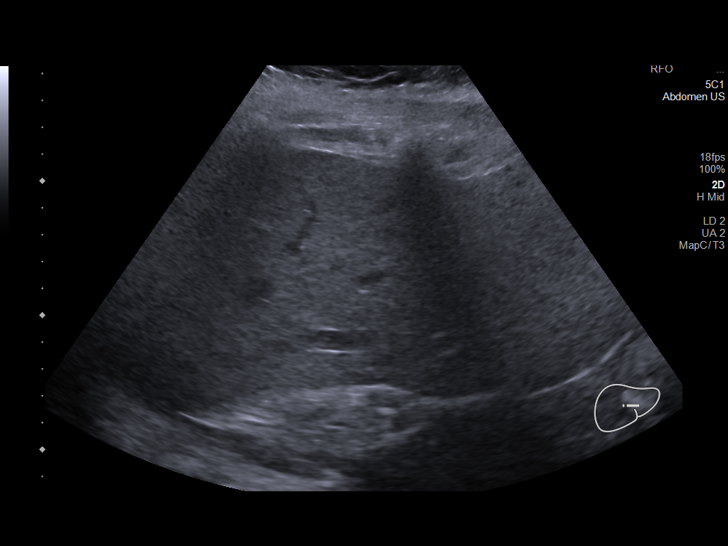
[im 28/45]
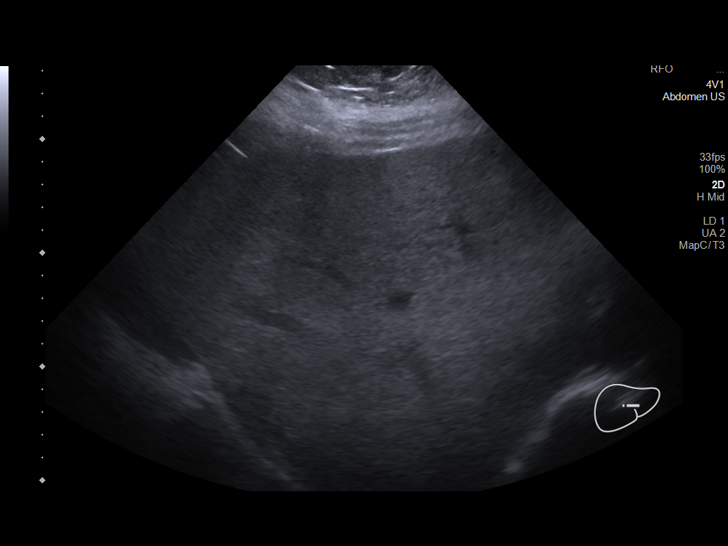
[im 30/45]
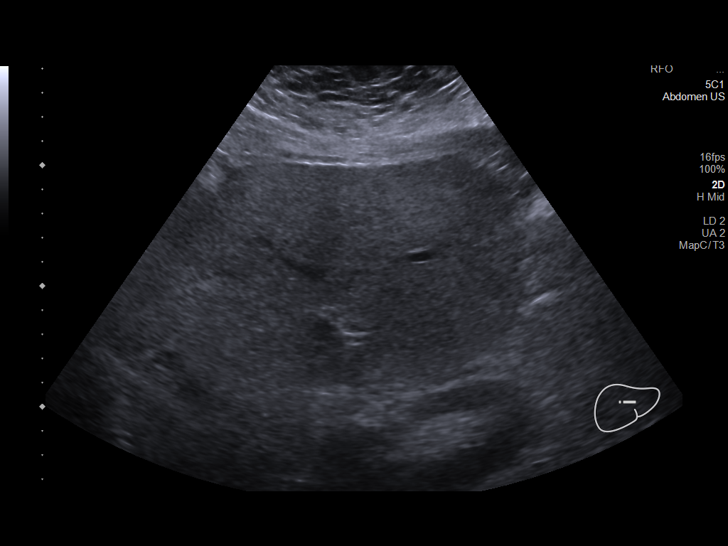
[im 34/45]
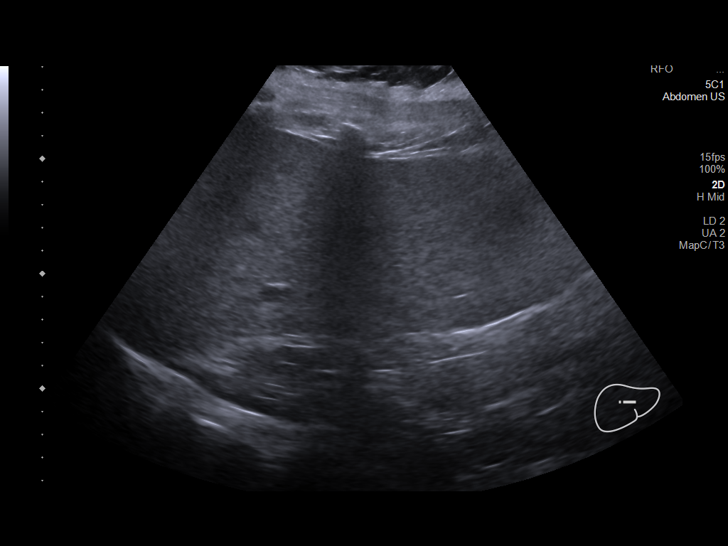
[im 37/45]
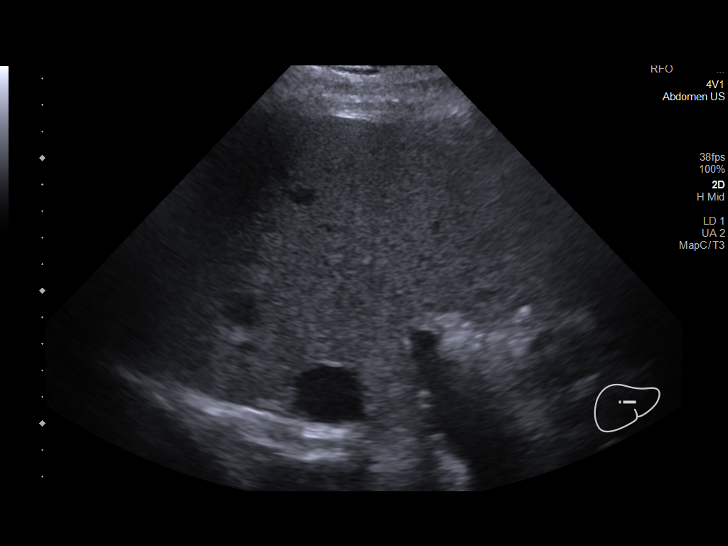
[im 41/45]
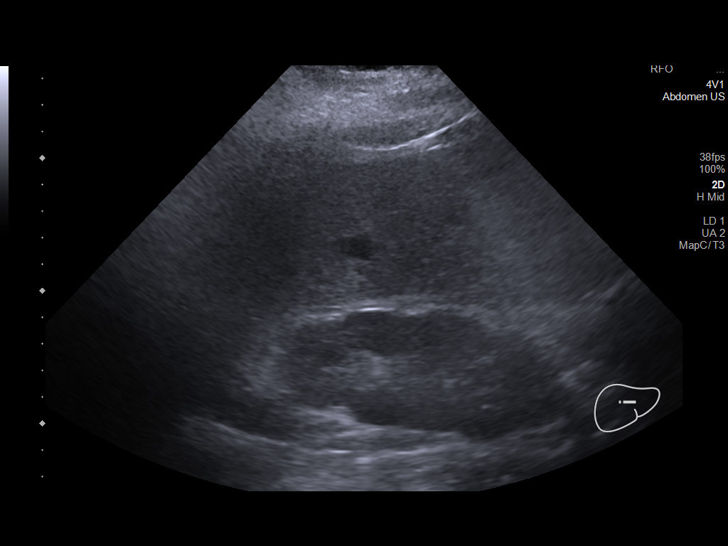
[im 45/45]
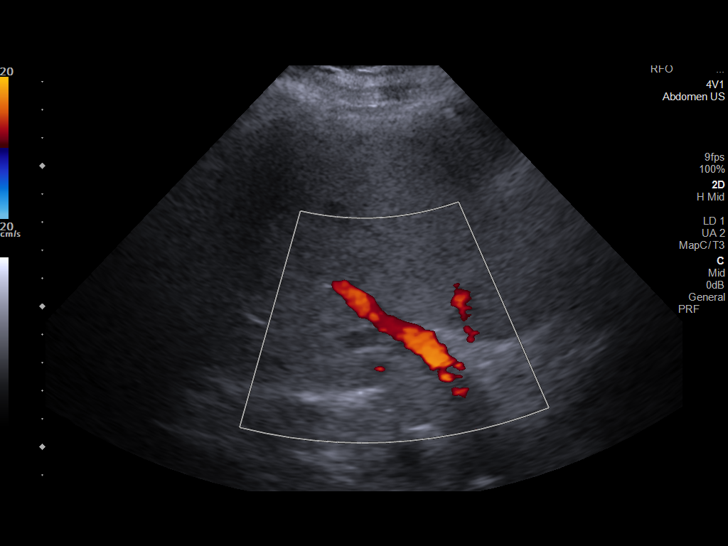

[14 of 25 positions shown; findings below may reference images not displayed]

FINDINGS: Gallbladder:

Surgically absent.

Common bile duct:

Diameter: 4 mm, normal.

Liver:

Echogenic liver (image 6). Chronic dystrophic calcification with
shadowing in the lateral left lobe of the liver appears stable since
the 8839 CT (image 10). No other discrete liver lesion. Portal vein
is patent on color Doppler imaging with normal direction of blood
flow towards the liver.

Other: Negative visible right kidney.  No ascites.
IMPRESSION: Evidence of chronic hepatic steatosis and left lobe scarring with
dystrophic calcification. No suspicious liver lesion by ultrasound.

## 2021-06-30 LAB — C. DIFFICILE GDH AND TOXIN A/B
GDH ANTIGEN: DETECTED
MICRO NUMBER:: 12382339
SPECIMEN QUALITY:: ADEQUATE
TOXIN A AND B: NOT DETECTED

## 2021-06-30 LAB — GASTROINTESTINAL PATHOGEN PANEL PCR
C. difficile Tox A/B, PCR: NOT DETECTED
Campylobacter, PCR: NOT DETECTED
Cryptosporidium, PCR: NOT DETECTED
E coli (ETEC) LT/ST PCR: NOT DETECTED
E coli (STEC) stx1/stx2, PCR: NOT DETECTED
E coli 0157, PCR: NOT DETECTED
Giardia lamblia, PCR: NOT DETECTED
Norovirus, PCR: NOT DETECTED
Rotavirus A, PCR: NOT DETECTED
Salmonella, PCR: NOT DETECTED
Shigella, PCR: NOT DETECTED

## 2021-06-30 LAB — CLOSTRIDIUM DIFFICILE TOXIN B, QUALITATIVE, REAL-TIME PCR: Toxigenic C. Difficile by PCR: NOT DETECTED

## 2021-07-02 ENCOUNTER — Ambulatory Visit (HOSPITAL_COMMUNITY)
Admission: RE | Admit: 2021-07-02 | Discharge: 2021-07-02 | Disposition: A | Discharge status: Home / Self Care | Payer: Medicare HMO | Source: Ambulatory Visit | Attending: Internal Medicine | Admitting: Internal Medicine

## 2021-07-02 ENCOUNTER — Other Ambulatory Visit: Payer: Self-pay

## 2021-07-02 DIAGNOSIS — R928 Other abnormal and inconclusive findings on diagnostic imaging of breast: Secondary | ICD-10-CM

## 2021-07-02 DIAGNOSIS — R922 Inconclusive mammogram: Secondary | ICD-10-CM | POA: Diagnosis not present

## 2021-07-03 ENCOUNTER — Ambulatory Visit (HOSPITAL_COMMUNITY)
Admission: RE | Admit: 2021-07-03 | Discharge: 2021-07-03 | Disposition: A | Discharge status: Home / Self Care | Payer: Medicare HMO | Source: Ambulatory Visit | Attending: Gastroenterology | Admitting: Gastroenterology

## 2021-07-03 DIAGNOSIS — K7460 Unspecified cirrhosis of liver: Secondary | ICD-10-CM | POA: Diagnosis not present

## 2021-07-03 DIAGNOSIS — K760 Fatty (change of) liver, not elsewhere classified: Secondary | ICD-10-CM | POA: Diagnosis not present

## 2021-07-08 ENCOUNTER — Encounter (HOSPITAL_COMMUNITY): Payer: Medicare HMO

## 2021-07-08 ENCOUNTER — Other Ambulatory Visit (HOSPITAL_COMMUNITY): Payer: Medicare HMO

## 2021-07-11 DIAGNOSIS — K219 Gastro-esophageal reflux disease without esophagitis: Secondary | ICD-10-CM | POA: Diagnosis not present

## 2021-07-11 DIAGNOSIS — E1165 Type 2 diabetes mellitus with hyperglycemia: Secondary | ICD-10-CM | POA: Diagnosis not present

## 2021-07-24 ENCOUNTER — Telehealth: Payer: Self-pay | Admitting: Internal Medicine

## 2021-07-24 NOTE — Telephone Encounter (Signed)
Tried to call pt to schedule EGD, LMOVM for return call.

## 2021-07-24 NOTE — Telephone Encounter (Signed)
Pre-op appt 08/19/21. Appt letter mailed with procedure instructions.

## 2021-07-24 NOTE — Telephone Encounter (Signed)
See other phone already started. EGD scheduled.

## 2021-07-24 NOTE — Telephone Encounter (Signed)
Spoke to pt, EGD scheduled for 08/21/21 at 10:15am. Orders entered.  PA for EGD submitted via [employer] website. [employer]# [id number], valid 08/21/21-09/20/21.

## 2021-07-24 NOTE — Telephone Encounter (Signed)
Pt returning call. [phone]

## 2021-08-11 DIAGNOSIS — K219 Gastro-esophageal reflux disease without esophagitis: Secondary | ICD-10-CM | POA: Diagnosis not present

## 2021-08-11 DIAGNOSIS — E1165 Type 2 diabetes mellitus with hyperglycemia: Secondary | ICD-10-CM | POA: Diagnosis not present

## 2021-08-15 NOTE — Patient Instructions (Addendum)
ELINDA BUNTEN  08/15/2021     @PREFPERIOPPHARMACY @   Your procedure is scheduled on  08/21/2021.   Report to [facility] at  [place].M.   Call this number if you have problems the morning of surgery:  [phone]   Remember:  Follow the diet and prep instructions given to you by the office.    Take 30 units of your insulin the night before your procedure.    DO NOT take any medications for diabetes the morning of your procedure.    Take these medicines the morning of surgery with A SIP OF WATER                      zyrtec, pepcid, metoprolol.     Do not wear jewelry, make-up or nail polish.  Do not wear lotions, powders, or perfumes, or deodorant.  Do not shave 48 hours prior to surgery.  Men may shave face and neck.  Do not bring valuables to the hospital.  [facility] is not responsible for any belongings or valuables.  Contacts, dentures or bridgework may not be worn into surgery.  Leave your suitcase in the car.  After surgery it may be brought to your room.  For patients admitted to the hospital, discharge time will be determined by your treatment team.  Patients discharged the day of surgery will not be allowed to drive home and must have someone with them for 24 hours.   Special instructions:   DO NOT smoke tobacco or vape for 24 hours before your procedure.  Please read over the following fact sheets that you were given. Anesthesia Post-op Instructions and Care and Recovery After Surgery.Marland Kitchen       Upper Endoscopy, Adult, Care After This sheet gives you information about how to care for yourself after your procedure. Your health care provider may also give you more specific instructions. If you have problems or questions, contact your health care provider. What can I expect after the procedure? After the procedure, it is common to have: A sore throat. Mild stomach pain or discomfort. Bloating. Nausea. Follow these instructions at home:  Follow  instructions from your health care provider about what to eat or drink after your procedure. Return to your normal activities as told by your health care provider. Ask your health care provider what activities are safe for you. Take over-the-counter and prescription medicines only as told by your health care provider. If you were given a sedative during the procedure, it can affect you for several hours. Do not drive or operate machinery until your health care provider says that it is safe. Keep all follow-up visits as told by your health care provider. This is important. Contact a health care provider if you have: A sore throat that lasts longer than one day. Trouble swallowing. Get help right away if: You vomit blood or your vomit looks like coffee grounds. You have: A fever. Bloody, black, or tarry stools. A severe sore throat or you cannot swallow. Difficulty breathing. Severe pain in your chest or abdomen. Summary After the procedure, it is common to have a sore throat, mild stomach discomfort, bloating, and nausea. If you were given a sedative during the procedure, it can affect you for several hours. Do not drive or operate machinery until your health care provider says that it is safe. Follow instructions from your health care provider about what to eat or drink after your  procedure. Return to your normal activities as told by your health care provider. This information is not intended to replace advice given to you by your health care provider. Make sure you discuss any questions you have with your health care provider. Document Revised: 08/04/2019 Document Reviewed: 02/28/2018 Elsevier Patient Education  2022 [place] After This sheet gives you information about how to care for yourself after your procedure. Your health care provider may also give you more specific instructions. If you have problems or questions, contact your health care  provider. What can I expect after the procedure? After the procedure, it is common to have: Tiredness. Forgetfulness about what happened after the procedure. Impaired judgment for important decisions. Nausea or vomiting. Some difficulty with balance. Follow these instructions at home: For the time period you were told by your health care provider:   Rest as needed. Do not participate in activities where you could fall or become injured. Do not drive or use machinery. Do not drink alcohol. Do not take sleeping pills or medicines that cause drowsiness. Do not make important decisions or sign legal documents. Do not take care of children on your own. Eating and drinking Follow the diet that is recommended by your health care provider. Drink enough fluid to keep your urine pale yellow. If you vomit: Drink water, juice, or soup when you can drink without vomiting. Make sure you have little or no nausea before eating solid foods. General instructions Have a responsible adult stay with you for the time you are told. It is important to have someone help care for you until you are awake and alert. Take over-the-counter and prescription medicines only as told by your health care provider. If you have sleep apnea, surgery and certain medicines can increase your risk for breathing problems. Follow instructions from your health care provider about wearing your sleep device: Anytime you are sleeping, including during daytime naps. While taking prescription pain medicines, sleeping medicines, or medicines that make you drowsy. Avoid smoking. Keep all follow-up visits as told by your health care provider. This is important. Contact a health care provider if: You keep feeling nauseous or you keep vomiting. You feel light-headed. You are still sleepy or having trouble with balance after 24 hours. You develop a rash. You have a fever. You have redness or swelling around the IV site. Get help  right away if: You have trouble breathing. You have new-onset confusion at home. Summary For several hours after your procedure, you may feel tired. You may also be forgetful and have poor judgment. Have a responsible adult stay with you for the time you are told. It is important to have someone help care for you until you are awake and alert. Rest as told. Do not drive or operate machinery. Do not drink alcohol or take sleeping pills. Get help right away if you have trouble breathing, or if you suddenly become confused. This information is not intended to replace advice given to you by your health care provider. Make sure you discuss any questions you have with your health care provider. Document Revised: 06/13/2020 Document Reviewed: 08/31/2019 Elsevier Patient Education  2022 [employer].

## 2021-08-19 ENCOUNTER — Encounter (HOSPITAL_COMMUNITY): Payer: Self-pay

## 2021-08-19 ENCOUNTER — Encounter (HOSPITAL_COMMUNITY)
Admission: RE | Admit: 2021-08-19 | Discharge: 2021-08-19 | Disposition: A | Discharge status: Home / Self Care | Payer: Medicare HMO | Source: Ambulatory Visit | Attending: Internal Medicine | Admitting: Internal Medicine

## 2021-08-19 ENCOUNTER — Other Ambulatory Visit: Payer: Self-pay

## 2021-08-19 DIAGNOSIS — Z01810 Encounter for preprocedural cardiovascular examination: Secondary | ICD-10-CM | POA: Diagnosis not present

## 2021-08-19 HISTORY — DX: Sleep apnea, unspecified: G47.30

## 2021-08-19 HISTORY — DX: Other specified postprocedural states: R11.2

## 2021-08-19 HISTORY — DX: Other specified postprocedural states: Z98.890

## 2021-08-21 ENCOUNTER — Encounter (HOSPITAL_COMMUNITY): Payer: Self-pay | Admitting: Internal Medicine

## 2021-08-21 ENCOUNTER — Encounter (HOSPITAL_COMMUNITY)
Admission: RE | Disposition: A | Discharge status: Home / Self Care | Payer: Self-pay | Source: Home / Self Care | Attending: Internal Medicine

## 2021-08-21 ENCOUNTER — Ambulatory Visit (HOSPITAL_COMMUNITY)
Admission: RE | Admit: 2021-08-21 | Discharge: 2021-08-21 | Disposition: A | Discharge status: Home / Self Care | Payer: Medicare HMO | Attending: Internal Medicine | Admitting: Internal Medicine

## 2021-08-21 ENCOUNTER — Ambulatory Visit (HOSPITAL_COMMUNITY): Payer: Medicare HMO | Admitting: Anesthesiology

## 2021-08-21 DIAGNOSIS — M797 Fibromyalgia: Secondary | ICD-10-CM | POA: Insufficient documentation

## 2021-08-21 DIAGNOSIS — K222 Esophageal obstruction: Secondary | ICD-10-CM | POA: Diagnosis not present

## 2021-08-21 DIAGNOSIS — K7460 Unspecified cirrhosis of liver: Secondary | ICD-10-CM | POA: Insufficient documentation

## 2021-08-21 DIAGNOSIS — Z794 Long term (current) use of insulin: Secondary | ICD-10-CM | POA: Diagnosis not present

## 2021-08-21 DIAGNOSIS — J45909 Unspecified asthma, uncomplicated: Secondary | ICD-10-CM | POA: Insufficient documentation

## 2021-08-21 DIAGNOSIS — I10 Essential (primary) hypertension: Secondary | ICD-10-CM | POA: Diagnosis not present

## 2021-08-21 DIAGNOSIS — K449 Diaphragmatic hernia without obstruction or gangrene: Secondary | ICD-10-CM | POA: Insufficient documentation

## 2021-08-21 DIAGNOSIS — Z7984 Long term (current) use of oral hypoglycemic drugs: Secondary | ICD-10-CM | POA: Insufficient documentation

## 2021-08-21 DIAGNOSIS — Z6841 Body Mass Index (BMI) 40.0 and over, adult: Secondary | ICD-10-CM | POA: Diagnosis not present

## 2021-08-21 DIAGNOSIS — E119 Type 2 diabetes mellitus without complications: Secondary | ICD-10-CM | POA: Diagnosis not present

## 2021-08-21 DIAGNOSIS — K219 Gastro-esophageal reflux disease without esophagitis: Secondary | ICD-10-CM | POA: Insufficient documentation

## 2021-08-21 DIAGNOSIS — E782 Mixed hyperlipidemia: Secondary | ICD-10-CM | POA: Insufficient documentation

## 2021-08-21 DIAGNOSIS — R1310 Dysphagia, unspecified: Secondary | ICD-10-CM

## 2021-08-21 HISTORY — PX: ESOPHAGOGASTRODUODENOSCOPY (EGD) WITH PROPOFOL: SHX5813

## 2021-08-21 HISTORY — PX: MALONEY DILATION: SHX5535

## 2021-08-21 LAB — GLUCOSE, CAPILLARY
Glucose-Capillary: 139 mg/dL — ABNORMAL HIGH (ref 70–99)
Glucose-Capillary: 133 mg/dL — ABNORMAL HIGH (ref 70–99)

## 2021-08-21 SURGERY — ESOPHAGOGASTRODUODENOSCOPY (EGD) WITH PROPOFOL
Anesthesia: General

## 2021-08-21 MED ORDER — LACTATED RINGERS IV SOLN: INTRAVENOUS | Status: DC

## 2021-08-21 MED ORDER — LIDOCAINE HCL (CARDIAC) PF 100 MG/5ML IV SOSY
PREFILLED_SYRINGE | INTRAVENOUS | Status: DC | PRN
  Administered 2021-08-21: 100 mg via INTRAVENOUS

## 2021-08-21 MED ORDER — PROPOFOL 10 MG/ML IV BOLUS
INTRAVENOUS | Status: DC | PRN
  Administered 2021-08-21 (×2): 50 mg via INTRAVENOUS
  Administered 2021-08-21: 100 mg via INTRAVENOUS
  Administered 2021-08-21 (×4): 50 mg via INTRAVENOUS

## 2021-08-21 NOTE — H&P (Signed)
@LOGO @   Primary Care Physician:  Celene Squibb, MD Primary Gastroenterologist:  Dr. Gala Romney  Pre-Procedure History & Physical: HPI:  Ebony Stevens is a 70 y.o. female here for an EGD.  History of cirrhosis.  History longstanding GERD.  No prior EGD.  Reports esophageal dysphagia to foods and medication chronically.  On no PPI.  Reflux poorly controlled on H2 blockers on demand  Past Medical History:  Diagnosis Date   Asthma    Chest pain 07/11/2015   Diabetes mellitus without complication ([place])    Dyspnea 07/11/2015   Early cirrhosis ([place]) 2020   Excessive bleeding in the premenopausal period    Fibromyalgia    GERD (gastroesophageal reflux disease)    HTN (hypertension)    Hypertension    Localized edema    Mixed hyperlipidemia    PONV (postoperative nausea and vomiting)    Sleep apnea    Type 2 diabetes mellitus ([place])     Past Surgical History:  Procedure Laterality Date   ABDOMINAL HYSTERECTOMY     1975 partial hysterectomy,  1978 "complete hysterctomy and retack bladder". states it didn't take.    BIOPSY  11/10/2018   Procedure: BIOPSY;  Surgeon: Daneil Dolin, MD;  [facility];  Service: Endoscopy;;  colon    CHOLECYSTECTOMY     COLONOSCOPY WITH PROPOFOL N/A 11/10/2018   Surgeon: Daneil Dolin, MD;  1 hyperplastic polyp, random colon biopsies benign, due for repeat in 2025 due to family history of colon cancer.   DILATION AND CURETTAGE OF UTERUS     POLYPECTOMY  11/10/2018   Procedure: POLYPECTOMY;  Surgeon: Daneil Dolin, MD;  Location: [facility];  Service: Endoscopy;;  colon    TONSILLECTOMY      Prior to Admission medications   Medication Sig Start Date End Date Taking? Authorizing Provider  acetaminophen (TYLENOL) 500 MG tablet Take 500 mg by mouth every 6 (six) hours as needed for moderate pain.   Yes [provider]  albuterol (VENTOLIN HFA) 108 (90 Base) MCG/ACT inhaler Inhale 1-2 puffs into the lungs every 6 (six) hours as  needed for wheezing or shortness of breath.   Yes [provider]  aspirin EC 325 MG tablet Take 325 mg by mouth daily.   Yes [provider]  atorvastatin (LIPITOR) 40 MG tablet Take 40 mg by mouth daily. 09/20/20  Yes [provider]  Calcium Carbonate-Vitamin D (CALTRATE 600+D PO) Take 1 tablet by mouth daily.   Yes [provider]  cetirizine (ZYRTEC) 10 MG tablet Take 10 mg by mouth daily.   Yes [provider]  estradiol (ESTRACE) 1 MG tablet Take 1 tablet by mouth at bedtime. 03/27/19  Yes [provider]  famotidine (PEPCID) 10 MG tablet Take 10 mg by mouth daily as needed for indigestion or heartburn.   Yes [provider]  furosemide (LASIX) 40 MG tablet TAKE ONE TABLET BY MOUTH EVERY OTHER DAY Patient taking differently: Take 40 mg by mouth daily. 05/28/17  Yes Fay Records, MD  glipiZIDE (GLUCOTROL) 10 MG tablet Take 10 mg by mouth 2 (two) times daily before a meal.   Yes [provider]  insulin NPH-regular Human (70-30) 100 UNIT/ML injection Inject 60-70 Units into the skin at bedtime.   Yes [provider]  lisinopril (PRINIVIL,ZESTRIL) 40 MG tablet Take 40 mg by mouth daily. 12/01/18  Yes [provider]  loperamide (IMODIUM A-D) 2 MG tablet Take 2 mg by  mouth as needed for diarrhea or loose stools.   Yes [provider]  metFORMIN (GLUCOPHAGE) 1000 MG tablet Take 1,000 mg by mouth 2 (two) times daily with a meal.   Yes [provider]  metoprolol succinate (TOPROL-XL) 100 MG 24 hr tablet Take 50 mg by mouth daily. Take with or immediately following a meal.   Yes [provider]  potassium chloride (K-DUR) 10 MEQ tablet TAKE ONE TABLET BY MOUTH EVERY OTHER DAY Patient taking differently: Take 10 mEq by mouth daily. 05/28/17  Yes Fay Records, MD  insulin regular (NOVOLIN R) 100 units/mL injection Inject 10 Units into the skin 3 (three) times daily after meals.     [provider]    Allergies as of 07/24/2021   (No Known Allergies)    Family History  Problem Relation Age of Onset   Cancer Sister        ovarian   Diabetes Brother    Colon cancer Brother 6       Passed from [facility] after 1 year    Social History   Socioeconomic History   Marital status: Married    Spouse name: Not on file   Number of children: Not on file   Years of education: Not on file   Highest education level: Not on file  Occupational History   Not on file  Tobacco Use   Smoking status: Never   Smokeless tobacco: Never  Vaping Use   Vaping Use: Never used  Substance and Sexual Activity   Alcohol use: No   Drug use: No   Sexual activity: Not Currently  Other Topics Concern   Not on file  Social History Narrative   ** Merged History Encounter **       Social Determinants of Health   Financial Resource Strain: Not on file  Food Insecurity: Not on file  Transportation Needs: Not on file  Physical Activity: Not on file  Stress: Not on file  Social Connections: Not on file  Intimate Partner Violence: Not on file    Review of Systems: See HPI, otherwise negative ROS  Physical Exam: BP (!) 136/59   Pulse (!) 48   Temp 98.3 F (36.8 C) (Oral)   Resp 15   Ht 5\' 1"  (1.549 m)   Wt 122 kg   SpO2 99%   BMI 50.83 kg/m  General:   Alert,  Well-developed, well-nourished, pleasant and cooperative in NAD nificant cervical adenopathy. Lungs:  Clear throughout to auscultation.   No wheezes, crackles, or rhonchi. No acute distress. Heart:  Regular rate and rhythm; no murmurs, clicks, rubs,  or gallops. Abdomen: Non-distended, normal bowel sounds.  Soft and nontender without appreciable mass or hepatosplenomegaly.  Pulses:  Normal pulses noted. Extremities:  Without clubbing or edema.  Impression/Plan: 70 year old lady with [name] cirrhosis here for screening EGD.  Longstanding liver disease and GERD.  No prior EGD. GERD poorly controlled.   Esophageal dysphagia.  Recommendations: I have offered the patient a diagnostic EGD EGD with possible esophageal dilation as feasible/appropriate today.  The risks, benefits, limitations, alternatives and imponderables have been reviewed with the patient. Potential for esophageal dilation, biopsy, etc. have also been reviewed.  Questions have been answered. All parties agreeable.      Notice: This dictation was prepared with Dragon dictation along with smaller phrase technology. Any transcriptional errors that result from this process are unintentional and may not be corrected upon review.

## 2021-08-21 NOTE — Op Note (Signed)
[facility] Patient Name: Ebony Stevens Procedure Date: 08/21/2021 10:31 AM MRN: [id number] Date of Birth: 1951/09/18 Attending MD: Norvel Richards , MD CSN: [id number] Age: 70 Admit Type: Outpatient Procedure:                Upper GI endoscopy Indications:              Dysphagia, Cirrhosis with suspected esophageal                            varices Providers:                Norvel Richards, MD, Caprice Kluver, Randa Spike, Technician Referring MD:              Medicines:                Propofol per Anesthesia. Complications:            No immediate complications. Estimated blood loss:                            None. Estimated Blood Loss:     Estimated blood loss was minimal. Procedure:                Pre-Anesthesia Assessment:                           - Prior to the procedure, a History and Physical                            was performed, and patient medications and                            allergies were reviewed. The patient's tolerance of                            previous anesthesia was also reviewed. The risks                            and benefits of the procedure and the sedation                            options and risks were discussed with the patient.                            All questions were answered, and informed consent                            was obtained. Prior Anticoagulants: The patient has                            taken no previous anticoagulant or antiplatelet                            agents. ASA Grade Assessment: II -  A patient with                            mild systemic disease. After reviewing the risks                            and benefits, the patient was deemed in                            satisfactory condition to undergo the procedure.                           After obtaining informed consent, the endoscope was                            passed under direct vision. Throughout the                             procedure, the patient's blood pressure, pulse, and                            oxygen saturations were monitored continuously. The                            GIF-H190 ([id number]) scope was introduced through the                            mouth, and advanced to the second part of duodenum.                            The upper GI endoscopy was accomplished without                            difficulty. The patient tolerated the procedure                            well. Scope In: 11:32:08 AM Scope Out: 11:42:52 AM Total Procedure Duration: 0 hours 10 minutes 44 seconds  Findings:      A mild Schatzki ring was found at the gastroesophageal junction. no       tumor. No varies. No Barrettts.      The scope was withdrawn. Dilation was performed with a Maloney dilator       with mild resistance at 56 Fr. The dilation site was examined following       endoscope reinsertion and showed no change. Estimated blood loss was       minimal. 3 quadrant bites taken with Bx forceps      A small hiatal hernia was present.      The duodenal bulb and second portion of the duodenum were normal. Impression:               - Mild Schatzki ring. dilated / disrupted                           - Small hiatal hernia. No portal Gastropathy                           -  Normal duodenal bulb and second portion of the                            duodenum.                           - No specimens collected. Moderate Sedation:      Moderate (conscious) sedation was personally administered by an       anesthesia professional. The following parameters were monitored: oxygen       saturation, heart rate, blood pressure, and response to care. Recommendation:           - Patient has a contact number available for                            emergencies. The signs and symptoms of potential                            delayed complications were discussed with the                            patient. Return to  normal activities tomorrow.                            Written discharge instructions were provided to the                            patient.                           - Advance diet as tolerated.                           - Continue present medications.                           - Return to my office in 3 months. Begin Protonix                            40 mg daily Procedure Code(s):        --- Professional ---                           [phone], Esophagogastroduodenoscopy, flexible,                            transoral; diagnostic, including collection of                            specimen(s) by brushing or washing, when performed                            (separate procedure)                           43450, Dilation of esophagus, by unguided sound or  bougie, single or multiple passes Diagnosis Code(s):        --- Professional ---                           K22.2, Esophageal obstruction                           K44.9, Diaphragmatic hernia without obstruction or                            gangrene                           R13.10, Dysphagia, unspecified                           K74.60, Unspecified cirrhosis of liver CPT copyright 2019 American Medical Association. All rights reserved. The codes documented in this report are preliminary and upon coder review may  be revised to meet current compliance requirements. Cristopher Estimable. Aryanna Shaver, MD Norvel Richards, MD 08/21/2021 11:56:58 AM This report has been signed electronically. Number of Addenda: 0

## 2021-08-21 NOTE — Discharge Instructions (Signed)
EGD Discharge instructions Please read the instructions outlined below and refer to this sheet in the next few weeks. These discharge instructions provide you with general information on caring for yourself after you leave the hospital. Your doctor may also give you specific instructions. While your treatment has been planned according to the most current medical practices available, unavoidable complications occasionally occur. If you have any problems or questions after discharge, please call your doctor. ACTIVITY You may resume your regular activity but move at a slower pace for the next 24 hours.  Take frequent rest periods for the next 24 hours.  Walking will help expel (get rid of) the air and reduce the bloated feeling in your abdomen.  No driving for 24 hours (because of the anesthesia (medicine) used during the test).  You may shower.  Do not sign any important legal documents or operate any machinery for 24 hours (because of the anesthesia used during the test).  NUTRITION Drink plenty of fluids.  You may resume your normal diet.  Begin with a light meal and progress to your normal diet.  Avoid alcoholic beverages for 24 hours or as instructed by your caregiver.  MEDICATIONS You may resume your normal medications unless your caregiver tells you otherwise.  WHAT YOU CAN EXPECT TODAY You may experience abdominal discomfort such as a feeling of fullness or "gas" pains.  FOLLOW-UP Your doctor will discuss the results of your test with you.  SEEK IMMEDIATE MEDICAL ATTENTION IF ANY OF THE FOLLOWING OCCUR: Excessive nausea (feeling sick to your stomach) and/or vomiting.  Severe abdominal pain and distention (swelling).  Trouble swallowing.  Temperature over 101 F (37.8 C).  Rectal bleeding or vomiting of blood.  Your esophagus was stretched today.  You do not have any esophageal varices which is good news You have a small hiatal hernia and a Schatzki's ring.  You need better  treatment for acid reflux  Begin Protonix 40 mg once daily before breakfast.  Prescription provided  Office visit with [place] in 3 months  At patient request, I called Derrek Gu at [phone] -reviewed findings and recommendations

## 2021-08-21 NOTE — Anesthesia Postprocedure Evaluation (Signed)
Anesthesia Post Note  Patient: NAJMO PARDUE  Procedure(s) Performed: ESOPHAGOGASTRODUODENOSCOPY (EGD) WITH PROPOFOL [place]  Patient location during evaluation: Phase II Anesthesia Type: General Level of consciousness: awake and alert and oriented Pain management: pain level controlled Vital Signs Assessment: post-procedure vital signs reviewed and stable Respiratory status: spontaneous breathing, nonlabored ventilation and respiratory function stable Cardiovascular status: blood pressure returned to baseline and stable Postop Assessment: no apparent nausea or vomiting Anesthetic complications: no   No notable events documented.   Last Vitals:  Vitals:   08/21/21 1201 08/21/21 1210  BP: (!) 116/98 (!) 110/42  Pulse: (!) 55 (!) 54  Resp: 16 20  Temp: 37.4 C 36.7 C  SpO2: 95% 95%    Last Pain:  Vitals:   08/21/21 1210  TempSrc: Axillary  PainSc: 2                  Keamber Macfadden C Corin Tilly

## 2021-08-21 NOTE — Anesthesia Preprocedure Evaluation (Signed)
Anesthesia Evaluation  Patient identified by MRN, date of birth, ID band Patient awake    Reviewed: Allergy & Precautions, NPO status , Patient's Chart, lab work & pertinent test results, reviewed documented beta blocker date and time   History of Anesthesia Complications (+) PONV and history of anesthetic complications  Airway Mallampati: II  TM Distance: >3 FB Neck ROM: Full    Dental  (+) Caps, Dental Advisory Given   Pulmonary shortness of breath and with exertion, asthma , sleep apnea and Continuous Positive Airway Pressure Ventilation ,    breath sounds clear to auscultation       Cardiovascular hypertension, Pt. on medications and Pt. on home beta blockers  Rhythm:Irregular Rate:Normal + Systolic murmurs (??) Left ventricle: The cavity size was normal. Wall thickness was increased in a pattern of moderate LVH. Systolic function was normal. The estimated ejection fraction was in the range of 60% to 65%. Wall motion was normal; there were no regional wall motion abnormalities. Features are consistent with a pseudonormal  left ventricular filling pattern, with concomitant abnormal  relaxation and increased filling pressure (grade 2 diastolic dysfunction). Doppler parameters are consistent with high ventricular filling pressure.    Neuro/Psych PSYCHIATRIC DISORDERS Anxiety Depression  Neuromuscular disease    GI/Hepatic GERD  Medicated,(+) Cirrhosis       ,   Endo/Other  diabetes, Well Controlled, Type 2, Oral Hypoglycemic Agents, Insulin DependentMorbid obesity  Renal/GU negative Renal ROS     Musculoskeletal  (+) Fibromyalgia -  Abdominal   Peds  Hematology   Anesthesia Other Findings   Reproductive/Obstetrics                            Anesthesia Physical Anesthesia Plan  ASA: 3  Anesthesia Plan: General   Post-op Pain Management:    Induction: Intravenous  PONV Risk  Score and Plan: TIVA  Airway Management Planned: Nasal Cannula and Natural Airway  Additional Equipment:   Intra-op Plan:   Post-operative Plan:   Informed Consent: I have reviewed the patients History and Physical, chart, labs and discussed the procedure including the risks, benefits and alternatives for the proposed anesthesia with the patient or authorized representative who has indicated his/her understanding and acceptance.     Dental advisory given  Plan Discussed with: CRNA and Surgeon  Anesthesia Plan Comments:         Anesthesia Quick Evaluation

## 2021-08-21 NOTE — Transfer of Care (Signed)
Immediate Anesthesia Transfer of Care Note  Patient: Ebony Stevens  Procedure(s) Performed: ESOPHAGOGASTRODUODENOSCOPY (EGD) WITH PROPOFOL [place]  Patient Location: PACU  Anesthesia Type:General  Level of Consciousness: awake, alert  and oriented  Airway & Oxygen Therapy: Patient Spontanous Breathing  Post-op Assessment: Report given to RN and Post -op Vital signs reviewed and stable  Post vital signs: Reviewed and stable  Last Vitals:  Vitals Value Taken Time  BP 116/98 08/21/21 1201  Temp 37.4 C 08/21/21 1201  Pulse 84 08/21/21 1201  Resp 21 08/21/21 1201  SpO2 96 % 08/21/21 1201  Vitals shown include unvalidated device data.  Last Pain:  Vitals:   08/21/21 1201  TempSrc: Tympanic  PainSc: 0-No pain         Complications: No notable events documented.

## 2021-08-22 NOTE — Addendum Note (Signed)
Addendum  created 08/22/21 0717 by Vista Deck, CRNA   Charge Capture section accepted

## 2021-08-27 ENCOUNTER — Encounter (HOSPITAL_COMMUNITY): Payer: Self-pay | Admitting: Internal Medicine

## 2021-09-02 ENCOUNTER — Telehealth: Payer: Self-pay

## 2021-09-02 NOTE — Telephone Encounter (Signed)
FYI: Documentation faxed to [facility]. Confirmation came back successful.

## 2021-09-02 NOTE — Telephone Encounter (Signed)
Documentation from [facility] needing additional codes for the pt's labs from July 18, 2021. On your desk in the box.

## 2021-09-02 NOTE — Telephone Encounter (Signed)
Noted  

## 2021-09-02 NOTE — Telephone Encounter (Signed)
Paperwork filled out and returned to [employer]. We will try additional diagnostic code K52.9, chronic diarrhea. Diagnostic code already attached to the lab orders included R19.7, diarrhea, unspecified.

## 2021-09-10 DIAGNOSIS — E1165 Type 2 diabetes mellitus with hyperglycemia: Secondary | ICD-10-CM | POA: Diagnosis not present

## 2021-09-10 DIAGNOSIS — K219 Gastro-esophageal reflux disease without esophagitis: Secondary | ICD-10-CM | POA: Diagnosis not present

## 2021-09-26 DIAGNOSIS — E1165 Type 2 diabetes mellitus with hyperglycemia: Secondary | ICD-10-CM | POA: Diagnosis not present

## 2021-09-26 DIAGNOSIS — I10 Essential (primary) hypertension: Secondary | ICD-10-CM | POA: Diagnosis not present

## 2021-09-30 DIAGNOSIS — E1165 Type 2 diabetes mellitus with hyperglycemia: Secondary | ICD-10-CM | POA: Diagnosis not present

## 2021-09-30 DIAGNOSIS — R800 Isolated proteinuria: Secondary | ICD-10-CM | POA: Diagnosis not present

## 2021-09-30 DIAGNOSIS — M545 Low back pain, unspecified: Secondary | ICD-10-CM | POA: Diagnosis not present

## 2021-09-30 DIAGNOSIS — J449 Chronic obstructive pulmonary disease, unspecified: Secondary | ICD-10-CM | POA: Diagnosis not present

## 2021-09-30 DIAGNOSIS — N959 Unspecified menopausal and perimenopausal disorder: Secondary | ICD-10-CM | POA: Diagnosis not present

## 2021-09-30 DIAGNOSIS — I129 Hypertensive chronic kidney disease with stage 1 through stage 4 chronic kidney disease, or unspecified chronic kidney disease: Secondary | ICD-10-CM | POA: Diagnosis not present

## 2021-09-30 DIAGNOSIS — R600 Localized edema: Secondary | ICD-10-CM | POA: Diagnosis not present

## 2021-09-30 DIAGNOSIS — N3281 Overactive bladder: Secondary | ICD-10-CM | POA: Diagnosis not present

## 2021-09-30 DIAGNOSIS — E782 Mixed hyperlipidemia: Secondary | ICD-10-CM | POA: Diagnosis not present

## 2021-09-30 DIAGNOSIS — I5032 Chronic diastolic (congestive) heart failure: Secondary | ICD-10-CM | POA: Diagnosis not present

## 2021-09-30 DIAGNOSIS — N1831 Chronic kidney disease, stage 3a: Secondary | ICD-10-CM | POA: Diagnosis not present

## 2021-10-14 DIAGNOSIS — C496 Malignant neoplasm of connective and soft tissue of trunk, unspecified: Secondary | ICD-10-CM | POA: Diagnosis not present

## 2021-10-14 DIAGNOSIS — L988 Other specified disorders of the skin and subcutaneous tissue: Secondary | ICD-10-CM | POA: Diagnosis not present

## 2021-10-23 DIAGNOSIS — C44599 Other specified malignant neoplasm of skin of other part of trunk: Secondary | ICD-10-CM | POA: Diagnosis not present

## 2021-11-20 DIAGNOSIS — C755 Malignant neoplasm of aortic body and other paraganglia: Secondary | ICD-10-CM | POA: Diagnosis not present

## 2021-11-21 DIAGNOSIS — C496 Malignant neoplasm of connective and soft tissue of trunk, unspecified: Secondary | ICD-10-CM | POA: Diagnosis not present

## 2021-12-04 DIAGNOSIS — D1800 Hemangioma unspecified site: Secondary | ICD-10-CM | POA: Diagnosis not present

## 2021-12-04 DIAGNOSIS — C755 Malignant neoplasm of aortic body and other paraganglia: Secondary | ICD-10-CM | POA: Diagnosis not present

## 2021-12-09 DIAGNOSIS — L905 Scar conditions and fibrosis of skin: Secondary | ICD-10-CM | POA: Diagnosis not present

## 2021-12-09 DIAGNOSIS — E119 Type 2 diabetes mellitus without complications: Secondary | ICD-10-CM | POA: Diagnosis not present

## 2021-12-09 DIAGNOSIS — C755 Malignant neoplasm of aortic body and other paraganglia: Secondary | ICD-10-CM | POA: Diagnosis not present

## 2021-12-09 DIAGNOSIS — K760 Fatty (change of) liver, not elsewhere classified: Secondary | ICD-10-CM | POA: Diagnosis not present

## 2021-12-09 DIAGNOSIS — I10 Essential (primary) hypertension: Secondary | ICD-10-CM | POA: Diagnosis not present

## 2021-12-09 DIAGNOSIS — R2232 Localized swelling, mass and lump, left upper limb: Secondary | ICD-10-CM | POA: Diagnosis not present

## 2021-12-09 DIAGNOSIS — Z08 Encounter for follow-up examination after completed treatment for malignant neoplasm: Secondary | ICD-10-CM | POA: Diagnosis not present

## 2021-12-09 DIAGNOSIS — C496 Malignant neoplasm of connective and soft tissue of trunk, unspecified: Secondary | ICD-10-CM | POA: Diagnosis not present

## 2021-12-09 DIAGNOSIS — C4359 Malignant melanoma of other part of trunk: Secondary | ICD-10-CM | POA: Diagnosis not present

## 2021-12-09 DIAGNOSIS — E7800 Pure hypercholesterolemia, unspecified: Secondary | ICD-10-CM | POA: Diagnosis not present

## 2021-12-09 DIAGNOSIS — Z85831 Personal history of malignant neoplasm of soft tissue: Secondary | ICD-10-CM | POA: Diagnosis not present

## 2021-12-22 ENCOUNTER — Ambulatory Visit: Payer: Medicare HMO | Admitting: Gastroenterology

## 2021-12-26 DIAGNOSIS — C755 Malignant neoplasm of aortic body and other paraganglia: Secondary | ICD-10-CM | POA: Diagnosis not present

## 2021-12-26 DIAGNOSIS — Z09 Encounter for follow-up examination after completed treatment for conditions other than malignant neoplasm: Secondary | ICD-10-CM | POA: Diagnosis not present

## 2021-12-26 DIAGNOSIS — C499 Malignant neoplasm of connective and soft tissue, unspecified: Secondary | ICD-10-CM | POA: Diagnosis not present

## 2022-01-01 ENCOUNTER — Telehealth: Payer: Self-pay | Admitting: Internal Medicine

## 2022-01-01 NOTE — Telephone Encounter (Signed)
Letter mailed

## 2022-01-01 NOTE — Telephone Encounter (Signed)
Recall for ultrasound 

## 2022-01-08 DIAGNOSIS — E1165 Type 2 diabetes mellitus with hyperglycemia: Secondary | ICD-10-CM | POA: Diagnosis not present

## 2022-01-08 DIAGNOSIS — I10 Essential (primary) hypertension: Secondary | ICD-10-CM | POA: Diagnosis not present

## 2022-01-13 ENCOUNTER — Telehealth: Payer: Self-pay | Admitting: Internal Medicine

## 2022-01-13 DIAGNOSIS — K7460 Unspecified cirrhosis of liver: Secondary | ICD-10-CM

## 2022-01-13 NOTE — Telephone Encounter (Signed)
Pt called to reschedule her OV and is aware of new appointment on 4/24 at 0830. She said she received a letter it was time to schedule her U/S. Does she need to have this done prior to her OV or can we schedule it when she comes in on 4/24?  [phone] ?

## 2022-01-13 NOTE — Telephone Encounter (Signed)
US abdomen RUQ scheduled for 01/21/22 at 10:00am, arrive at 9:30am. NPO after midnight before test. ? ?Called pt, informed her of [place] appt. Appt letter mailed. ?

## 2022-01-15 DIAGNOSIS — E782 Mixed hyperlipidemia: Secondary | ICD-10-CM | POA: Diagnosis not present

## 2022-01-15 DIAGNOSIS — M545 Low back pain, unspecified: Secondary | ICD-10-CM | POA: Diagnosis not present

## 2022-01-15 DIAGNOSIS — J449 Chronic obstructive pulmonary disease, unspecified: Secondary | ICD-10-CM | POA: Diagnosis not present

## 2022-01-15 DIAGNOSIS — N959 Unspecified menopausal and perimenopausal disorder: Secondary | ICD-10-CM | POA: Diagnosis not present

## 2022-01-15 DIAGNOSIS — I129 Hypertensive chronic kidney disease with stage 1 through stage 4 chronic kidney disease, or unspecified chronic kidney disease: Secondary | ICD-10-CM | POA: Diagnosis not present

## 2022-01-15 DIAGNOSIS — R800 Isolated proteinuria: Secondary | ICD-10-CM | POA: Diagnosis not present

## 2022-01-15 DIAGNOSIS — E1165 Type 2 diabetes mellitus with hyperglycemia: Secondary | ICD-10-CM | POA: Diagnosis not present

## 2022-01-15 DIAGNOSIS — I5032 Chronic diastolic (congestive) heart failure: Secondary | ICD-10-CM | POA: Diagnosis not present

## 2022-01-15 DIAGNOSIS — R600 Localized edema: Secondary | ICD-10-CM | POA: Diagnosis not present

## 2022-01-15 DIAGNOSIS — N3281 Overactive bladder: Secondary | ICD-10-CM | POA: Diagnosis not present

## 2022-01-15 DIAGNOSIS — Z6841 Body Mass Index (BMI) 40.0 and over, adult: Secondary | ICD-10-CM | POA: Diagnosis not present

## 2022-01-21 ENCOUNTER — Ambulatory Visit (HOSPITAL_COMMUNITY)
Admission: RE | Admit: 2022-01-21 | Discharge: 2022-01-21 | Disposition: A | Discharge status: Home / Self Care | Payer: Medicare HMO | Source: Ambulatory Visit | Attending: Gastroenterology | Admitting: Gastroenterology

## 2022-01-21 DIAGNOSIS — K7460 Unspecified cirrhosis of liver: Secondary | ICD-10-CM | POA: Insufficient documentation

## 2022-01-21 DIAGNOSIS — K760 Fatty (change of) liver, not elsewhere classified: Secondary | ICD-10-CM | POA: Diagnosis not present

## 2022-01-31 NOTE — Progress Notes (Signed)
? ? ?Referring Provider: Celene Squibb, MD ?Primary Care Physician:  Celene Squibb, MD ?Primary GI Physician: Dr. Gala Romney ? ?Chief Complaint  ?Patient presents with  ? Follow-up  ? ? ?HPI:   ?Ebony Stevens is a 71 y.o. female with a history of cirrhosis (F3+F4 on elastography 2020) thought to be secondary to NAFLD/NASH with extensive serologic work-up benign, evidence of liver lesion on MRI years ago at [facility], [place] in May 2019 with hepatic steatosis, hepatomegaly, and area of chronic volume loss in left liver lobe with new calcification and pneumobilia within likely due to evolution of remote insult related to prior cholecystectomy, AFP normal. Also with history of GERD, chronic diarrhea previously managed with Questran and antidiarrheals, celiac's screening negative and random colon biopsies benign, presenting today for routine follow-up. ? ?Colonoscopy up-to-date in 2020 with 1 hyperplastic polyp, random colon biopsies benign, due for repeat in 2025 due to family history of colon cancer. ? ?Last seen in our office 06/23/21.  She reported occasional swelling in her lower extremities, but nothing persistent.  No other signs or symptoms of decompensated liver disease.  She needed first-ever EGD follow-up routine labs and ultrasound.  Reflux symptoms well controlled on Pepcid as needed.  Chronic alternating constipation and diarrhea, but diarrhea more prominent.  Ran out of Questran, but stated it was too expensive and would cause constipation.  She was controlling diarrhea with Imodium.  Reported diarrhea was bad 2 to 3 days a week.  Typically with 1-4 bowel movements daily, postprandial.  Often watery with intermittent urgency, occasional nocturnal stools.  Reported diarrhea has been present for years and got worse after cholecystectomy.  She was eating fast food often. Suspected bile salt diarrhea, but planned to check TSH and stool studies as it appeared they had never been checked.  Also with chronic unchanged right  flank pain occurring every few weeks at random.  Reported pinched nerve in her back. ? ?Ultrasound completed with findings consistent with fatty liver, but superimposed inflammation/fibrosis not excluded. ?Labs completed and corresponded to MELD 6.   ?No immunity to hepatitis A. ?TSH within normal limits.  Stool studies negative. ?EGD 08/21/2021: Mild Schatzki's ring dilated, small hiatal hernia, no portal gastropathy, normal examined duodenum.  Recommended starting Protonix 40 mg daily. ? ?Most recent ultrasound 01/21/2022 with fatty liver, no focal liver lesions. ? ?Today: ? ?Cirrhosis: ?No yellowing of the eyes or skin, bruising/bleeding, abdominal distention, mental status changes/confusion.  Chronic intermittent lower extremity edema, unchanged and taking Lasix 40 mg daily previously prescribed by cardiology, but now PCP.   ? ?Wants to lose weight. States sweets are her weakness. Has trouble going to bed at night due to cravings.  ? ?Hep B Vaccination: Completed.  ?Hep A Vaccination: Started series.  ? ? ?GERD: ?Well-controlled on Protonix 40 mg daily. Cough also improved.  ? ?Swallowing is improved, but still with some symptoms at times. Usually with meats, breads, or vegetables. May have to cough items back up. Was every time she ate, now several days a week. Wants to wait on BPE.  ? ? ?No brbpr or melena. Bowels are doing ok using imodium as needed. Diarrhea is not as bad as it used to be. ? ?Past Medical History:  ?Diagnosis Date  ? Asthma   ? Chest pain 07/11/2015  ? Diabetes mellitus without complication ([place])   ? Dyspnea 07/11/2015  ? Early cirrhosis ([place]) 2020  ? Excessive bleeding in the premenopausal period   ? Fibromyalgia   ?  GERD (gastroesophageal reflux disease)   ? HTN (hypertension)   ? Hypertension   ? Localized edema   ? Mixed hyperlipidemia   ? PONV (postoperative nausea and vomiting)   ? Sleep apnea   ? Type 2 diabetes mellitus ([place])   ? ? ?Past Surgical History:  ?Procedure Laterality Date   ? ABDOMINAL HYSTERECTOMY    ? 1975 partial hysterectomy,  1978 "complete hysterctomy and retack bladder". states it didn't take.   ? BIOPSY  11/10/2018  ? Procedure: BIOPSY;  Surgeon: Daneil Dolin, MD;  [facility];  Service: Endoscopy;;  colon ?  ? CHOLECYSTECTOMY    ? COLONOSCOPY WITH PROPOFOL N/A 11/10/2018  ? Surgeon: Daneil Dolin, MD;  1 hyperplastic polyp, random colon biopsies benign, due for repeat in 2025 due to family history of colon cancer.  ? DILATION AND CURETTAGE OF UTERUS    ? ESOPHAGOGASTRODUODENOSCOPY (EGD) WITH PROPOFOL N/A 08/21/2021  ? Surgeon: Daneil Dolin, MD; Mild Schatzki's ring dilated, small hiatal hernia, no portal gastropathy, normal examined duodenum.  ? MALONEY DILATION  08/21/2021  ? Procedure: MALONEY DILATION;  Surgeon: Daneil Dolin, MD;  [facility];  Service: Endoscopy;;  ? POLYPECTOMY  11/10/2018  ? Procedure: POLYPECTOMY;  Surgeon: Daneil Dolin, MD;  [facility];  Service: Endoscopy;;  colon ?  ? TONSILLECTOMY    ? ? ?Current Outpatient Medications  ?Medication Sig Dispense Refill  ? acetaminophen (TYLENOL) 500 MG tablet Take 500 mg by mouth every 6 (six) hours as needed for moderate pain.    ? albuterol (VENTOLIN HFA) 108 (90 Base) MCG/ACT inhaler Inhale 1-2 puffs into the lungs every 6 (six) hours as needed for wheezing or shortness of breath.    ? aspirin EC 325 MG tablet Take 325 mg by mouth daily.    ? atorvastatin (LIPITOR) 40 MG tablet Take 40 mg by mouth daily.    ? Calcium Carbonate-Vitamin D (CALTRATE 600+D PO) Take 1 tablet by mouth daily.    ? cetirizine (ZYRTEC) 10 MG tablet Take 10 mg by mouth daily.    ? estradiol (ESTRACE) 1 MG tablet Take 1 tablet by mouth at bedtime.    ? furosemide (LASIX) 40 MG tablet TAKE ONE TABLET BY MOUTH EVERY OTHER DAY (Patient taking differently: Take 40 mg by mouth daily.) 15 tablet 2  ? glipiZIDE (GLUCOTROL) 10 MG tablet Take 10 mg by mouth 2 (two) times daily before a meal.    ?  insulin NPH-regular Human (70-30) 100 UNIT/ML injection Inject 60-70 Units into the skin at bedtime.    ? insulin regular (NOVOLIN R) 100 units/mL injection Inject 10 Units into the skin 3 (three) times daily after meals.    ? lisinopril (PRINIVIL,ZESTRIL) 40 MG tablet Take 40 mg by mouth daily.    ? loperamide (IMODIUM A-D) 2 MG tablet Take 2 mg by mouth as needed for diarrhea or loose stools.    ? metFORMIN (GLUCOPHAGE) 1000 MG tablet Take 1,000 mg by mouth 2 (two) times daily with a meal.    ? metoprolol succinate (TOPROL-XL) 100 MG 24 hr tablet Take 50 mg by mouth daily. Take with or immediately following a meal.    ? potassium chloride (K-DUR) 10 MEQ tablet TAKE ONE TABLET BY MOUTH EVERY OTHER DAY (Patient taking differently: Take 10 mEq by mouth daily.) 15 tablet 2  ? pantoprazole (PROTONIX) 40 MG tablet Take 1 tablet (40 mg total) by mouth daily before breakfast. 90 tablet  1  ? ?No current facility-administered medications for this visit.  ? ? ?Allergies as of 02/02/2022  ? (No Known Allergies)  ? ? ?Family History  ?Problem Relation Age of Onset  ? Cancer Sister   ?     ovarian  ? Diabetes Brother   ? Colon cancer Brother 58  ?     Passed from [facility] after 1 year  ? ? ?Social History  ? ?Socioeconomic History  ? Marital status: Married  ?  Spouse name: Not on file  ? Number of children: Not on file  ? Years of education: Not on file  ? Highest education level: Not on file  ?Occupational History  ? Not on file  ?Tobacco Use  ? Smoking status: Never  ? Smokeless tobacco: Never  ?Vaping Use  ? Vaping Use: Never used  ?Substance and Sexual Activity  ? Alcohol use: No  ? Drug use: No  ? Sexual activity: Not Currently  ?Other Topics Concern  ? Not on file  ?Social History Narrative  ? ** Merged History Encounter **  ?    ? ?Social Determinants of Health  ? ?Financial Resource Strain: Not on file  ?Food Insecurity: Not on file  ?Transportation Needs: Not on file  ?Physical Activity: Not on file  ?Stress: Not on file   ?Social Connections: Not on file  ? ? ?Review of Systems: ?Gen: Denies fever, chills, cold or flulike symptoms, presyncope, syncope. ?CV: Denies chest pain, palpitations. ?Resp: Denies dyspnea or cough. ?G

## 2022-02-02 ENCOUNTER — Ambulatory Visit: Payer: Medicare HMO | Admitting: Gastroenterology

## 2022-02-02 ENCOUNTER — Encounter: Payer: Self-pay | Admitting: Gastroenterology

## 2022-02-02 VITALS — BP 144/58 | HR 58 | Temp 97.5°F | Ht 61.0 in | Wt 268.2 lb

## 2022-02-02 DIAGNOSIS — R1310 Dysphagia, unspecified: Secondary | ICD-10-CM | POA: Diagnosis not present

## 2022-02-02 DIAGNOSIS — K7460 Unspecified cirrhosis of liver: Secondary | ICD-10-CM | POA: Diagnosis not present

## 2022-02-02 DIAGNOSIS — K219 Gastro-esophageal reflux disease without esophagitis: Secondary | ICD-10-CM

## 2022-02-02 MED ORDER — PANTOPRAZOLE SODIUM 40 MG PO TBEC: 40.0000 mg | DELAYED_RELEASE_TABLET | Freq: Every day | ORAL | 1 refills | Status: DC

## 2022-02-02 NOTE — Patient Instructions (Addendum)
Please have labs completed at [place].  ? ?Complete Hepatitis A Vaccination Series.  ? ?You will be due for a [place] in October 2023. You will receive a letter when it is time for this.  ? ?Recommend 1-2# weight loss per week until ideal body weight through exercise & diet. ?Low fat/cholesterol diet.   ?Avoid sweets, sodas, fruit juices, sweetened beverages like tea, etc. ?Gradually increase exercise from 15 min daily up to 1 hr per day 5 days/week. ?Continue to avoid alcohol use. ? ?Continue Protonix 40 mg daily 30 minutes before breakfast. I have sent a refill to your pharmacy.  ? ?Swallowing precautions:  ?Eat slowly, take small bites, chew thoroughly, drink plenty of liquids throughout meals.  ?Avoid trough textures ?All meats should be chopped finely.  ?If something gets hung in your esophagus and will not come up or go down, proceed to the emergency room.   ? ?We will see back in 6 months.  Please call sooner if you have any questions or concerns. ? ?Aliene Altes, PA-C ?[facility] ? ? ? ? ? ?

## 2022-02-03 DIAGNOSIS — T8140XA Infection following a procedure, unspecified, initial encounter: Secondary | ICD-10-CM | POA: Diagnosis not present

## 2022-02-03 LAB — CBC WITH DIFFERENTIAL/PLATELET
Absolute Monocytes: 298 cells/uL (ref 200–950)
Basophils Absolute: 28 cells/uL (ref 0–200)
Basophils Relative: 0.4 %
Eosinophils Absolute: 114 cells/uL (ref 15–500)
Eosinophils Relative: 1.6 %
HCT: 41.5 % (ref 35.0–45.0)
Hemoglobin: 13.4 g/dL (ref 11.7–15.5)
Lymphs Abs: 1661 cells/uL (ref 850–3900)
MCH: 29.6 pg (ref 27.0–33.0)
MCHC: 32.3 g/dL (ref 32.0–36.0)
MCV: 91.6 fL (ref 80.0–100.0)
MPV: 11.3 fL (ref 7.5–12.5)
Monocytes Relative: 4.2 %
Neutro Abs: 4998 cells/uL (ref 1500–7800)
Neutrophils Relative %: 70.4 %
Platelets: 175 10*3/uL (ref 140–400)
RBC: 4.53 10*6/uL (ref 3.80–5.10)
RDW: 12.7 % (ref 11.0–15.0)
Total Lymphocyte: 23.4 %
WBC: 7.1 10*3/uL (ref 3.8–10.8)

## 2022-02-03 LAB — COMPLETE METABOLIC PANEL WITH GFR
AG Ratio: 1.6 (calc) (ref 1.0–2.5)
ALT: 14 U/L (ref 6–29)
AST: 19 U/L (ref 10–35)
Albumin: 3.8 g/dL (ref 3.6–5.1)
Alkaline phosphatase (APISO): 93 U/L (ref 37–153)
BUN: 18 mg/dL (ref 7–25)
CO2: 26 mmol/L (ref 20–32)
Calcium: 8.7 mg/dL (ref 8.6–10.4)
Chloride: 106 mmol/L (ref 98–110)
Creat: 0.92 mg/dL (ref 0.50–1.05)
Globulin: 2.4 g/dL (calc) (ref 1.9–3.7)
Glucose, Bld: 132 mg/dL (ref 65–139)
Potassium: 4.4 mmol/L (ref 3.5–5.3)
Sodium: 140 mmol/L (ref 135–146)
Total Bilirubin: 0.5 mg/dL (ref 0.2–1.2)
Total Protein: 6.2 g/dL (ref 6.1–8.1)
eGFR: 67 mL/min/{1.73_m2} (ref >=60)

## 2022-02-03 LAB — PROTIME-INR
INR: 1
Prothrombin Time: 10 s (ref 9.0–11.5)

## 2022-02-04 DIAGNOSIS — L570 Actinic keratosis: Secondary | ICD-10-CM | POA: Diagnosis not present

## 2022-02-04 DIAGNOSIS — X32XXXD Exposure to sunlight, subsequent encounter: Secondary | ICD-10-CM | POA: Diagnosis not present

## 2022-02-04 DIAGNOSIS — Z1283 Encounter for screening for malignant neoplasm of skin: Secondary | ICD-10-CM | POA: Diagnosis not present

## 2022-02-04 DIAGNOSIS — D225 Melanocytic nevi of trunk: Secondary | ICD-10-CM | POA: Diagnosis not present

## 2022-02-08 DIAGNOSIS — E782 Mixed hyperlipidemia: Secondary | ICD-10-CM | POA: Diagnosis not present

## 2022-02-08 DIAGNOSIS — I129 Hypertensive chronic kidney disease with stage 1 through stage 4 chronic kidney disease, or unspecified chronic kidney disease: Secondary | ICD-10-CM | POA: Diagnosis not present

## 2022-02-08 DIAGNOSIS — N1831 Chronic kidney disease, stage 3a: Secondary | ICD-10-CM | POA: Diagnosis not present

## 2022-02-08 DIAGNOSIS — E1165 Type 2 diabetes mellitus with hyperglycemia: Secondary | ICD-10-CM | POA: Diagnosis not present

## 2022-04-15 DIAGNOSIS — D225 Melanocytic nevi of trunk: Secondary | ICD-10-CM | POA: Diagnosis not present

## 2022-04-15 DIAGNOSIS — Z1283 Encounter for screening for malignant neoplasm of skin: Secondary | ICD-10-CM | POA: Diagnosis not present

## 2022-04-17 DIAGNOSIS — E1165 Type 2 diabetes mellitus with hyperglycemia: Secondary | ICD-10-CM | POA: Diagnosis not present

## 2022-04-17 DIAGNOSIS — E782 Mixed hyperlipidemia: Secondary | ICD-10-CM | POA: Diagnosis not present

## 2022-04-21 DIAGNOSIS — R800 Isolated proteinuria: Secondary | ICD-10-CM | POA: Diagnosis not present

## 2022-04-21 DIAGNOSIS — I5032 Chronic diastolic (congestive) heart failure: Secondary | ICD-10-CM | POA: Diagnosis not present

## 2022-04-21 DIAGNOSIS — E782 Mixed hyperlipidemia: Secondary | ICD-10-CM | POA: Diagnosis not present

## 2022-04-21 DIAGNOSIS — N3281 Overactive bladder: Secondary | ICD-10-CM | POA: Diagnosis not present

## 2022-04-21 DIAGNOSIS — I129 Hypertensive chronic kidney disease with stage 1 through stage 4 chronic kidney disease, or unspecified chronic kidney disease: Secondary | ICD-10-CM | POA: Diagnosis not present

## 2022-04-21 DIAGNOSIS — E1165 Type 2 diabetes mellitus with hyperglycemia: Secondary | ICD-10-CM | POA: Diagnosis not present

## 2022-04-21 DIAGNOSIS — M545 Low back pain, unspecified: Secondary | ICD-10-CM | POA: Diagnosis not present

## 2022-04-21 DIAGNOSIS — R600 Localized edema: Secondary | ICD-10-CM | POA: Diagnosis not present

## 2022-04-21 DIAGNOSIS — F32A Depression, unspecified: Secondary | ICD-10-CM | POA: Diagnosis not present

## 2022-06-26 DIAGNOSIS — C499 Malignant neoplasm of connective and soft tissue, unspecified: Secondary | ICD-10-CM | POA: Diagnosis not present

## 2022-06-26 DIAGNOSIS — C755 Malignant neoplasm of aortic body and other paraganglia: Secondary | ICD-10-CM | POA: Diagnosis not present

## 2022-06-26 DIAGNOSIS — R918 Other nonspecific abnormal finding of lung field: Secondary | ICD-10-CM | POA: Diagnosis not present

## 2022-06-28 ENCOUNTER — Other Ambulatory Visit: Payer: Self-pay | Admitting: Gastroenterology

## 2022-06-28 DIAGNOSIS — K219 Gastro-esophageal reflux disease without esophagitis: Secondary | ICD-10-CM

## 2022-06-29 DIAGNOSIS — M545 Low back pain, unspecified: Secondary | ICD-10-CM | POA: Diagnosis not present

## 2022-07-01 ENCOUNTER — Encounter: Payer: Self-pay | Admitting: *Deleted

## 2022-07-15 DIAGNOSIS — M47816 Spondylosis without myelopathy or radiculopathy, lumbar region: Secondary | ICD-10-CM | POA: Diagnosis not present

## 2022-07-15 DIAGNOSIS — Z6841 Body Mass Index (BMI) 40.0 and over, adult: Secondary | ICD-10-CM | POA: Diagnosis not present

## 2022-07-18 ENCOUNTER — Other Ambulatory Visit: Payer: Self-pay | Admitting: Neurosurgery

## 2022-07-18 DIAGNOSIS — M47816 Spondylosis without myelopathy or radiculopathy, lumbar region: Secondary | ICD-10-CM

## 2022-08-03 NOTE — Progress Notes (Signed)
GI Office Note    Referring Provider: Celene Squibb, MD Primary Care Physician:  Celene Squibb, MD Primary Gastroenterologist: Cristopher Estimable.Rourk, MD  Date:  08/04/2022  ID:  Ebony Stevens, DOB 04-15-1951, MRN [id number]   Chief Complaint   Chief Complaint  Patient presents with   Follow-up    History of Present Illness  Ebony Stevens is a 71 y.o. female with a history of early cirrhosis (F3 + F4 on elastography in 2020) thought to be secondary to NAFLD/NASH with benign extensive work-up, liver lesion on MRI many years ago at [facility], cholecystectomy, GERD, chronic diarrhea, diabetes, HLD, HTN, asthma and Schatzki's ring presenting today for follow-up.  CT in May 2019 with hepatic steatosis, hepatomegaly, and area of chronic volume loss in the left lobe of the liver with new calcification and pneumobilia likely due to evolution of remote insult related to cholecystectomy.  AFP normal in the past.  Chronic diarrhea previously managed with Questran and antidiarrheals.  Has had benign random colon biopsies and celiac screening.  Colonoscopy in 2020 with 1 hyperplastic polyp, benign random colon biopsies.  Due to repeat in 2025 due to family history of colon cancer.  Ultrasound September 2022 with fatty liver, superimposed inflammation or fibrosis not excluded, patent portal vein, evidence of cholecystectomy, 4 cm right renal angiomyolipoma.  Labs revealed a MELD score of 6.  She was found to be nonimmune to hepatitis A.  TSH stool studies negative.  EGD 08/21/2021: Not Schatzki's ring s/p dilation, small to hernia, no portal gastropathy, normal duodenum.  Recommend starting on Protonix 40 mg daily.  Last office visit 02/02/2022.  No signs of decompensation.  Reported chronic intermittent lower extremity edema that has been unchanged.  Maintained on Lasix 40 mg daily.  Reported able to lose weight, having trouble going to bed at night due to cravings for sweets.  Had completed hepatitis B  vaccination and has started her hepatitis A vaccination series.  GERD controlled on Protonix 4 mg daily with improvement in her cough.  Swallowing improved but still with intermittent symptoms usually with meat, breads, and vegetables.  Prefer to wait on barium pill esophagram.  Reported improvement and diarrhea, was using Imodium as needed.  Abdominal ultrasound in April 2023 with fatty liver, no focal liver lesions present. Labs in April 2023 all within normal limits.  MELD Na: 6   Today:  Cirrhosis: Had cancer surgery the first of the year and is still paying on that.  Also reports she is still paying on her prior ultrasound from this year. Denies any yellowing of her skin or eyes, bruising/bleeding, abdominal distention, confusion, mental status changes.  LE edema is about the same as her baseline. Is improved in the morning after being in bed.   Hep B Vaccination: Completed.  Hep A Vaccination: Completed.   GERD: Controlled on pantoprazole 40 mg daily.   Dysphagia: Food at times does get stuck in her throat, has been happening almost everyday. Sometimes feels like her food is trying to go down into her lungs. Would like to hold off on BPE.   Diarrhea: Imodium as needed. Sometimes her stools are soft and formed and sometimes little loose pieces and they are small. Last week only took it 2 days. Before that she only took 2 tables. Some days she does not need to take anything. Diet has not changed. Has been trying to diet but she has cravings at night. Reports she may have gained some weight.  Current Outpatient Medications  Medication Sig Dispense Refill   acetaminophen (TYLENOL) 500 MG tablet Take 500 mg by mouth every 6 (six) hours as needed for moderate pain.     aspirin EC 325 MG tablet Take 325 mg by mouth daily.     atorvastatin (LIPITOR) 40 MG tablet Take 40 mg by mouth daily.     Calcium Carbonate-Vitamin D (CALTRATE 600+D PO) Take 1 tablet by mouth daily.     cetirizine  (ZYRTEC) 10 MG tablet Take 10 mg by mouth daily.     estradiol (ESTRACE) 1 MG tablet Take 1 tablet by mouth at bedtime.     furosemide (LASIX) 40 MG tablet TAKE ONE TABLET BY MOUTH EVERY OTHER DAY (Patient taking differently: Take 40 mg by mouth daily.) 15 tablet 2   glipiZIDE (GLUCOTROL) 10 MG tablet Take 10 mg by mouth 2 (two) times daily before a meal.     insulin NPH-regular Human (70-30) 100 UNIT/ML injection Inject 60-70 Units into the skin at bedtime.     insulin regular (NOVOLIN R) 100 units/mL injection Inject 10 Units into the skin 3 (three) times daily after meals.     lisinopril (PRINIVIL,ZESTRIL) 40 MG tablet Take 40 mg by mouth daily.     loperamide (IMODIUM A-D) 2 MG tablet Take 2 mg by mouth as needed for diarrhea or loose stools.     metFORMIN (GLUCOPHAGE) 1000 MG tablet Take 1,000 mg by mouth 2 (two) times daily with a meal.     metoprolol succinate (TOPROL-XL) 100 MG 24 hr tablet Take 50 mg by mouth daily. Take with or immediately following a meal.     pantoprazole (PROTONIX) 40 MG tablet TAKE 1 TABLET EVERY DAY BEFORE BREAKFAST 90 tablet 3   potassium chloride (K-DUR) 10 MEQ tablet TAKE ONE TABLET BY MOUTH EVERY OTHER DAY (Patient taking differently: Take 10 mEq by mouth daily.) 15 tablet 2   albuterol (VENTOLIN HFA) 108 (90 Base) MCG/ACT inhaler Inhale 1-2 puffs into the lungs every 6 (six) hours as needed for wheezing or shortness of breath. (Patient not taking: Reported on 08/04/2022)     No current facility-administered medications for this visit.    Past Medical History:  Diagnosis Date   Asthma    Chest pain 07/11/2015   Diabetes mellitus without complication ([place])    Dyspnea 07/11/2015   Early cirrhosis ([place]) 2020   Excessive bleeding in the premenopausal period    Fibromyalgia    GERD (gastroesophageal reflux disease)    HTN (hypertension)    Hypertension    Localized edema    Mixed hyperlipidemia    PONV (postoperative nausea and vomiting)    Sleep apnea     Type 2 diabetes mellitus ([place])     Past Surgical History:  Procedure Laterality Date   ABDOMINAL HYSTERECTOMY     1975 partial hysterectomy,  1978 "complete hysterctomy and retack bladder". states it didn't take.    BIOPSY  11/10/2018   Procedure: BIOPSY;  Surgeon: Daneil Dolin, MD;  [facility];  Service: Endoscopy;;  colon    CHOLECYSTECTOMY     COLONOSCOPY WITH PROPOFOL N/A 11/10/2018   Surgeon: Daneil Dolin, MD;  1 hyperplastic polyp, random colon biopsies benign, due for repeat in 2025 due to family history of colon cancer.   DILATION AND CURETTAGE OF UTERUS     ESOPHAGOGASTRODUODENOSCOPY (EGD) WITH PROPOFOL N/A 08/21/2021   Surgeon: Daneil Dolin, MD; Mild Schatzki's ring dilated, small hiatal hernia, no portal  gastropathy, normal examined duodenum.   MALONEY DILATION  08/21/2021   Procedure: MALONEY DILATION;  Surgeon: Daneil Dolin, MD;  [facility];  Service: Endoscopy;;   POLYPECTOMY  11/10/2018   Procedure: POLYPECTOMY;  Surgeon: Daneil Dolin, MD;  [facility];  Service: Endoscopy;;  colon    TONSILLECTOMY      Family History  Problem Relation Age of Onset   Cancer Sister        ovarian   Diabetes Brother    Colon cancer Brother 41       Passed from [facility] after 1 year    Allergies as of 08/04/2022   (No Known Allergies)    Social History   Socioeconomic History   Marital status: Married    Spouse name: Not on file   Number of children: Not on file   Years of education: Not on file   Highest education level: Not on file  Occupational History   Not on file  Tobacco Use   Smoking status: Never   Smokeless tobacco: Never  Vaping Use   Vaping Use: Never used  Substance and Sexual Activity   Alcohol use: No   Drug use: No   Sexual activity: Not Currently  Other Topics Concern   Not on file  Social History Narrative   ** Merged History Encounter **       Social Determinants of Health   Financial  Resource Strain: Not on file  Food Insecurity: Not on file  Transportation Needs: Not on file  Physical Activity: Not on file  Stress: Not on file  Social Connections: Not on file     Review of Systems   Gen: Denies fever, chills, anorexia. Denies fatigue, weakness, weight loss.  CV: Denies chest pain, palpitations, syncope, peripheral edema, and claudication. Resp: Denies dyspnea at rest, cough, wheezing, coughing up blood, and pleurisy. GI: See HPI Derm: Denies rash, itching, dry skin Psych: Denies depression, anxiety, memory loss, confusion. No homicidal or suicidal ideation.  Heme: Denies bruising, bleeding, and enlarged lymph nodes.   Physical Exam   BP (!) 152/75 (BP Location: Left Arm, Patient Position: Sitting, Cuff Size: Large)   Pulse 78   Temp 97.7 F (36.5 C) (Temporal)   Ht 5' 1.5" (1.562 m)   Wt 284 lb (128.8 kg)   SpO2 95%   BMI 52.79 kg/m   General:   Alert and oriented. No distress noted. Pleasant and cooperative.  Head:  Normocephalic and atraumatic. Eyes:  Conjuctiva clear without scleral icterus. Mouth:  Oral mucosa pink and moist. Good dentition. No lesions. Lungs:  Clear to auscultation bilaterally. No wheezes, rales, or rhonchi. No distress.  Heart:  S1, S2 present without murmurs appreciated.  Abdomen:  +BS, soft, non-distended.  Mild tenderness to epigastric region.  No rebound or guarding. No HSM or masses noted. Rectal: Deferred Msk:  Symmetrical without gross deformities. Normal posture. Extremities:  Without edema. Neurologic:  Alert and  oriented x4 Psych:  Alert and cooperative. Normal mood and affect.   Assessment  Ebony Stevens is a 71 y.o. female with a history of early cirrhosis (F3 + F4 on elastography in 2020) thought to be secondary to NAFLD/NASH with benign extensive work-up, liver lesion on MRI many years ago at [facility], cholecystectomy, GERD, chronic diarrhea, diabetes, HLD, HTN, asthma and Schatzki's ring presenting today for  follow-up.  Early cirrhosis: Likely secondary to NAFLD/NASH given advanced fibrosis/early cirrhosis noted on ultrasound and F3+F4 on elastography in  2020.  Prior labs obtained in April 2023 her MELD Na is 6 and Child-Pugh A.  She had EGD in November 2022 without any varices or portal hypertensive gastropathy.  She has completed hepatitis A and B vaccination.  She remains well compensated.  She is due for hepatoma screening and updated labs.  She has agreed to complete labs at this time, would like to hold off on hepatoma screening with elastography until after the beginning of the year, possibly February to give herself enough time to finish paying for her prior ultrasound.  We discussed the importance of screening for [facility] with concern for cirrhosis.  She reports he has been unable to lose any weight, may have gained a few pounds, will consider referal to healthy weight and wellness.   GERD: Well-controlled on pantoprazole 40 mg daily.  Still reports some intermittent solid food dysphagia, reports she is having almost daily symptoms without any regurgitation of food.  She mostly describes feeling like sometimes food or liquids are trying to go down her lungs instead of her esophagus.  BPE offered to assess for any signs of aspiration, however she declines and would like to hold off until possibly the beginning of the year.  Dysphagia: EGD in November 2022 with mild Schatzki's ring s/p dilation.  Has been maintained on Protonix 40 mg daily. Would like to wait on BPE.   Chronic diarrhea: History of cholecystectomy.  Likely secondary to bile salt diarrhea.  Previous work-up with negative stool studies, celiac screen, normal TSH, and benign random colon biopsies on colonoscopy in 2020.  Was previously taking Questran but stopped taking due to constipation and cost.  Using Imodium as needed, sometimes does not need it.  Reports mostly soft stools will occasionally have loose pieces that are small in nature.   Denies any melena or BRBPR.  Due for repeat colonoscopy in 2025.  PLAN   CBC, CMP, PT/INR ([facility]) Continue pantoprazole 40 mg daily.  Abdominal ultrasound with elastography in February per patient request due to financial reasons.  Fatty liver/cirrhosis diet GERD diet/lifestyle modifications Continue Imodium as needed BPE offered, patient declined at this time, would like to wait until after the first of the year and reconsider. Referral to healthy weight and wellness if requested. Follow-up in 6 months    Venetia Night, MSN, FNP-BC, AGACNP-BC [facility]

## 2022-08-04 ENCOUNTER — Ambulatory Visit (INDEPENDENT_AMBULATORY_CARE_PROVIDER_SITE_OTHER): Payer: Medicare HMO | Admitting: Gastroenterology

## 2022-08-04 ENCOUNTER — Encounter: Payer: Self-pay | Admitting: Gastroenterology

## 2022-08-04 VITALS — BP 152/75 | HR 78 | Temp 97.7°F | Ht 61.5 in | Wt 284.0 lb

## 2022-08-04 DIAGNOSIS — K7460 Unspecified cirrhosis of liver: Secondary | ICD-10-CM

## 2022-08-04 DIAGNOSIS — K219 Gastro-esophageal reflux disease without esophagitis: Secondary | ICD-10-CM | POA: Diagnosis not present

## 2022-08-04 DIAGNOSIS — R1310 Dysphagia, unspecified: Secondary | ICD-10-CM

## 2022-08-04 DIAGNOSIS — K529 Noninfective gastroenteritis and colitis, unspecified: Secondary | ICD-10-CM | POA: Diagnosis not present

## 2022-08-04 NOTE — Patient Instructions (Signed)
I am ordering labs for you to have completed at [facility].  Nutrition:  High-protein diet from a primarily plant-based diet. Avoid red meat.  No raw or undercooked meat, seafood, or shellfish. Low-fat/cholesterol/carbohydrate diet. Limit sodium to no more than 2000 mg/day including everything that you eat and drink. Recommend at least 30 minutes of aerobic and resistance exercise 3 days/week.  Continue Imodium as needed for your chronic diarrhea.  Continue your pantoprazole 40 mg daily for your reflux. Follow a GERD diet:  Avoid fried, fatty, greasy, spicy, citrus foods. Avoid caffeine and carbonated beverages. Avoid chocolate. Try eating 4-6 small meals a day rather than 3 large meals. Do not eat within 3 hours of laying down. Prop head of bed up on wood or bricks to create a 6 inch incline.  We will place you on recall to have your Ultrasound with elastography completed in February per your request.   If your swallowing worsens and you decide that you would want to complete a barium pill esophagram, please call the office and let us know.  If you decide you would like to proceed with a referral to [place] [facility] to assist you in weight loss, please call the office and let us know, I am happy to replace the referral for you.  It was a pleasure to see you today. I want to create trusting relationships with patients. If you receive a survey regarding your visit,  I greatly appreciate you taking time to fill this out on paper or through your MyChart. I value your feedback.  Venetia Night, MSN, FNP-BC, AGACNP-BC [facility]

## 2022-08-05 LAB — COMPREHENSIVE METABOLIC PANEL
AG Ratio: 1.6 (calc) (ref 1.0–2.5)
ALT: 19 U/L (ref 6–29)
AST: 26 U/L (ref 10–35)
Albumin: 3.9 g/dL (ref 3.6–5.1)
Alkaline phosphatase (APISO): 107 U/L (ref 37–153)
BUN: 19 mg/dL (ref 7–25)
CO2: 24 mmol/L (ref 20–32)
Calcium: 8.5 mg/dL — ABNORMAL LOW (ref 8.6–10.4)
Chloride: 107 mmol/L (ref 98–110)
Creat: 0.98 mg/dL (ref 0.50–1.05)
Globulin: 2.4 g/dL (calc) (ref 1.9–3.7)
Glucose, Bld: 129 mg/dL — ABNORMAL HIGH (ref 65–99)
Potassium: 4.4 mmol/L (ref 3.5–5.3)
Sodium: 139 mmol/L (ref 135–146)
Total Bilirubin: 0.5 mg/dL (ref 0.2–1.2)
Total Protein: 6.3 g/dL (ref 6.1–8.1)

## 2022-08-05 LAB — PROTIME-INR
INR: 1
Prothrombin Time: 10.4 s (ref 9.0–11.5)

## 2022-08-05 LAB — CBC
HCT: 39.6 % (ref 35.0–45.0)
Hemoglobin: 12.9 g/dL (ref 11.7–15.5)
MCH: 29.1 pg (ref 27.0–33.0)
MCHC: 32.6 g/dL (ref 32.0–36.0)
MCV: 89.2 fL (ref 80.0–100.0)
MPV: 11.1 fL (ref 7.5–12.5)
Platelets: 200 10*3/uL (ref 140–400)
RBC: 4.44 10*6/uL (ref 3.80–5.10)
RDW: 13 % (ref 11.0–15.0)
WBC: 5.9 10*3/uL (ref 3.8–10.8)

## 2022-08-07 ENCOUNTER — Ambulatory Visit
Admission: RE | Admit: 2022-08-07 | Discharge: 2022-08-07 | Disposition: A | Discharge status: Home / Self Care | Payer: Medicare HMO | Source: Ambulatory Visit | Attending: Neurosurgery | Admitting: Neurosurgery

## 2022-08-07 DIAGNOSIS — M545 Low back pain, unspecified: Secondary | ICD-10-CM | POA: Diagnosis not present

## 2022-08-07 DIAGNOSIS — M47816 Spondylosis without myelopathy or radiculopathy, lumbar region: Secondary | ICD-10-CM

## 2022-08-07 DIAGNOSIS — M4316 Spondylolisthesis, lumbar region: Secondary | ICD-10-CM | POA: Diagnosis not present

## 2022-08-07 DIAGNOSIS — R200 Anesthesia of skin: Secondary | ICD-10-CM | POA: Diagnosis not present

## 2022-08-13 DIAGNOSIS — E782 Mixed hyperlipidemia: Secondary | ICD-10-CM | POA: Diagnosis not present

## 2022-08-13 DIAGNOSIS — I10 Essential (primary) hypertension: Secondary | ICD-10-CM | POA: Diagnosis not present

## 2022-08-13 DIAGNOSIS — E1165 Type 2 diabetes mellitus with hyperglycemia: Secondary | ICD-10-CM | POA: Diagnosis not present

## 2022-08-18 DIAGNOSIS — H524 Presbyopia: Secondary | ICD-10-CM | POA: Diagnosis not present

## 2022-08-18 DIAGNOSIS — H40053 Ocular hypertension, bilateral: Secondary | ICD-10-CM | POA: Diagnosis not present

## 2022-08-18 DIAGNOSIS — H52223 Regular astigmatism, bilateral: Secondary | ICD-10-CM | POA: Diagnosis not present

## 2022-08-18 DIAGNOSIS — H5203 Hypermetropia, bilateral: Secondary | ICD-10-CM | POA: Diagnosis not present

## 2022-08-18 DIAGNOSIS — E113293 Type 2 diabetes mellitus with mild nonproliferative diabetic retinopathy without macular edema, bilateral: Secondary | ICD-10-CM | POA: Diagnosis not present

## 2022-08-19 DIAGNOSIS — R800 Isolated proteinuria: Secondary | ICD-10-CM | POA: Diagnosis not present

## 2022-08-19 DIAGNOSIS — E782 Mixed hyperlipidemia: Secondary | ICD-10-CM | POA: Diagnosis not present

## 2022-08-19 DIAGNOSIS — I129 Hypertensive chronic kidney disease with stage 1 through stage 4 chronic kidney disease, or unspecified chronic kidney disease: Secondary | ICD-10-CM | POA: Diagnosis not present

## 2022-08-19 DIAGNOSIS — R600 Localized edema: Secondary | ICD-10-CM | POA: Diagnosis not present

## 2022-08-19 DIAGNOSIS — F32A Depression, unspecified: Secondary | ICD-10-CM | POA: Diagnosis not present

## 2022-08-19 DIAGNOSIS — N3281 Overactive bladder: Secondary | ICD-10-CM | POA: Diagnosis not present

## 2022-08-19 DIAGNOSIS — Z0000 Encounter for general adult medical examination without abnormal findings: Secondary | ICD-10-CM | POA: Diagnosis not present

## 2022-08-19 DIAGNOSIS — Z23 Encounter for immunization: Secondary | ICD-10-CM | POA: Diagnosis not present

## 2022-08-19 DIAGNOSIS — M545 Low back pain, unspecified: Secondary | ICD-10-CM | POA: Diagnosis not present

## 2022-08-20 ENCOUNTER — Other Ambulatory Visit (HOSPITAL_COMMUNITY): Payer: Self-pay | Admitting: Family Medicine

## 2022-08-20 DIAGNOSIS — Z1231 Encounter for screening mammogram for malignant neoplasm of breast: Secondary | ICD-10-CM

## 2022-08-20 DIAGNOSIS — H524 Presbyopia: Secondary | ICD-10-CM | POA: Diagnosis not present

## 2022-08-26 DIAGNOSIS — M4310 Spondylolisthesis, site unspecified: Secondary | ICD-10-CM | POA: Diagnosis not present

## 2022-08-26 DIAGNOSIS — M47816 Spondylosis without myelopathy or radiculopathy, lumbar region: Secondary | ICD-10-CM | POA: Diagnosis not present

## 2022-08-26 DIAGNOSIS — Z6841 Body Mass Index (BMI) 40.0 and over, adult: Secondary | ICD-10-CM | POA: Diagnosis not present

## 2022-09-02 ENCOUNTER — Ambulatory Visit (HOSPITAL_COMMUNITY)
Admission: RE | Admit: 2022-09-02 | Discharge: 2022-09-02 | Disposition: A | Discharge status: Home / Self Care | Payer: Medicare HMO | Source: Ambulatory Visit | Attending: Family Medicine | Admitting: Family Medicine

## 2022-09-02 DIAGNOSIS — Z1231 Encounter for screening mammogram for malignant neoplasm of breast: Secondary | ICD-10-CM | POA: Insufficient documentation

## 2022-09-11 DIAGNOSIS — M48062 Spinal stenosis, lumbar region with neurogenic claudication: Secondary | ICD-10-CM | POA: Diagnosis not present

## 2022-09-28 DIAGNOSIS — M48062 Spinal stenosis, lumbar region with neurogenic claudication: Secondary | ICD-10-CM | POA: Diagnosis not present

## 2022-10-14 DIAGNOSIS — L820 Inflamed seborrheic keratosis: Secondary | ICD-10-CM | POA: Diagnosis not present

## 2022-10-14 DIAGNOSIS — Z1283 Encounter for screening for malignant neoplasm of skin: Secondary | ICD-10-CM | POA: Diagnosis not present

## 2022-10-14 DIAGNOSIS — D225 Melanocytic nevi of trunk: Secondary | ICD-10-CM | POA: Diagnosis not present

## 2022-10-22 DIAGNOSIS — M48062 Spinal stenosis, lumbar region with neurogenic claudication: Secondary | ICD-10-CM | POA: Diagnosis not present

## 2022-10-22 DIAGNOSIS — M4310 Spondylolisthesis, site unspecified: Secondary | ICD-10-CM | POA: Diagnosis not present

## 2022-10-22 DIAGNOSIS — Z6841 Body Mass Index (BMI) 40.0 and over, adult: Secondary | ICD-10-CM | POA: Diagnosis not present

## 2022-11-12 ENCOUNTER — Other Ambulatory Visit: Payer: Self-pay

## 2022-11-12 ENCOUNTER — Ambulatory Visit (HOSPITAL_COMMUNITY): Payer: Medicare HMO | Attending: Internal Medicine | Admitting: Physical Therapy

## 2022-11-12 DIAGNOSIS — M6281 Muscle weakness (generalized): Secondary | ICD-10-CM | POA: Diagnosis not present

## 2022-11-12 DIAGNOSIS — M5459 Other low back pain: Secondary | ICD-10-CM | POA: Insufficient documentation

## 2022-11-12 NOTE — Therapy (Signed)
OUTPATIENT PHYSICAL THERAPY THORACOLUMBAR EVALUATION   Patient Name: Ebony Stevens MRN: [id number] DOB:[birth year], 72 y.o., female Today's Date: 11/12/2022  END OF SESSION:  PT End of Session - 11/12/22 1652     Visit Number 1    Number of Visits 12    Date for PT Re-Evaluation 12/24/22    Authorization Type humana-authorization placed.    PT Start Time 1515    PT Stop Time 1558    PT Time Calculation (min) 43 min    Activity Tolerance Patient tolerated treatment well    Behavior During Therapy WFL for tasks assessed/performed             Past Medical History:  Diagnosis Date   Asthma    Chest pain 07/11/2015   Diabetes mellitus without complication ([place])    Dyspnea 07/11/2015   Early cirrhosis ([place]) 2020   Excessive bleeding in the premenopausal period    Fibromyalgia    GERD (gastroesophageal reflux disease)    HTN (hypertension)    Hypertension    Localized edema    Mixed hyperlipidemia    PONV (postoperative nausea and vomiting)    Sleep apnea    Type 2 diabetes mellitus ([place])    Past Surgical History:  Procedure Laterality Date   ABDOMINAL HYSTERECTOMY     1975 partial hysterectomy,  1978 "complete hysterctomy and retack bladder". states it didn't take.    BIOPSY  11/10/2018   Procedure: BIOPSY;  Surgeon: Daneil Dolin, MD;  [facility];  Service: Endoscopy;;  colon    CHOLECYSTECTOMY     COLONOSCOPY WITH PROPOFOL N/A 11/10/2018   Surgeon: Daneil Dolin, MD;  1 hyperplastic polyp, random colon biopsies benign, due for repeat in 2025 due to family history of colon cancer.   DILATION AND CURETTAGE OF UTERUS     ESOPHAGOGASTRODUODENOSCOPY (EGD) WITH PROPOFOL N/A 08/21/2021   Surgeon: Daneil Dolin, MD; Mild Schatzki's ring dilated, small hiatal hernia, no portal gastropathy, normal examined duodenum.   [place]  08/21/2021   Procedure: MALONEY DILATION;  Surgeon: Daneil Dolin, MD;  [facility];  Service: Endoscopy;;    POLYPECTOMY  11/10/2018   Procedure: POLYPECTOMY;  Surgeon: Daneil Dolin, MD;  [facility];  Service: Endoscopy;;  colon    TONSILLECTOMY     Patient Active Problem List   Diagnosis Date Noted   Dysphagia 02/02/2022   Diarrhea 06/23/2021   Bradycardia 06/23/2021   Family history of colon cancer 09/26/2020   Early cirrhosis ([place]) 09/26/2019   Abdominal pain 09/26/2019   Chronic diarrhea 09/28/2018   Hepatomegaly 09/28/2018   Dyspnea 03/27/2016   Chest pain 03/27/2016   Asthma 03/27/2016   Allergic rhinitis 03/27/2016   Upper abdominal pain 03/27/2016   Dysthymic disorder 03/27/2016   Fibromyalgia 03/27/2016   Gastroesophageal reflux disease 03/27/2016   Generalized anxiety disorder 03/27/2016   Major depressive disorder 03/27/2016   Benign essential HTN 03/27/2016   Insomnia 03/27/2016   Obesity 03/27/2016   Contact dermatitis 03/27/2016   Diabetes mellitus without complication ([place]) 40/98/1191   Mixed hyperlipidemia 03/27/2016   Acute bronchitis 03/27/2016   Cough 03/27/2016   Localized edema 03/27/2016   Menopausal disorder 03/27/2016    PCP: Allyn Kenner  REFERRING PROVIDER: Earnie Larsson, MD  REFERRING DIAG:  M43.10 (ICD-10-CM) - Spondylolisthesis, site unspecified    Rationale for Evaluation and Treatment: Rehabilitation  THERAPY DIAG:  Other low back pain Rt radiculopathy Muscle weakness  ONSET DATE: for  years but she has got to the point she could not walk without pain.                                                                                                                                                                                             SUBJECTIVE STATEMENT: Pt states that she has had low back pain for as long as she can remember.  She went to the chiropractor for a few years and then he sent her to the orthopedic MD>  She has had shots in her back which has not improved her pain.  PT states that standing is worst with a  limit of 30 minutes of standing.  She can walk for 30 minutes and sit for an hour.    PERTINENT HISTORY:  As above   PAIN:  Are you having pain? Yes: NPRS scale: 5/10; past week pain has gone up to a 9/10; best is a 4/10  Pain location: RT low back radiating to the Foot but when weight bearing B Low back pain  Pain description: aching  Aggravating factors: activity Relieving factors: sitting   PRECAUTIONS: None  WEIGHT BEARING RESTRICTIONS: No  FALLS:  Has patient fallen in last 6 months? No  LIVING ENVIRONMENT: Lives with: lives with their family Lives in: House/apartment Stairs: Yes: External: 7 steps; can reach both Goes up steps one at a time  Has following equipment at home: None  OCCUPATION:retired   PLOF: Independent with community mobility without device  PATIENT GOALS: To be more mobile where she can get out and walk  NEXT MD VISIT: 12/03/2022  OBJECTIVE:   DIAGNOSTIC FINDINGS:  IMPRESSION: 1. Stable appearance of the lumbar spine. 2. Moderate facet hypertrophy at L3-4, L4-5, and L5-S1 without significant stenosis at these levels. 3. Shallow right paramedian disc protrusion at L1-2 without significant stenosis.     Electronically Signed   By: San Morelle M.D.   On: 08/10/2022 22:28    PATIENT SURVEYS:  FOTO 34  POSTURE: rounded shoulders, forward head, decreased lumbar lordosis, and increased thoracic kyphosis   LUMBAR ROM:   AROM eval  Flexion Fingers to lower shin 8" off floor ; reps increase pain, with pulling up more painful   Extension 25 reps no change  Right lateral flexion   Left lateral flexion   Right rotation   Left rotation    (Blank rows = not tested)   LOWER EXTREMITY MMT:    MMT Right eval Left eval  Hip flexion 3/5 4/5  Hip extension 3-/5 4/5  Hip abduction 5/5 4/5  Hip adduction    Hip  internal rotation    Hip external rotation    Knee flexion 4/5 3/5  Knee extension 3+ 3+  Ankle dorsiflexion 3+ 3+   Ankle plantarflexion    Ankle inversion    Ankle eversion     (Blank rows = not tested)    FUNCTIONAL TESTS:  30 seconds chair stand test:  6, (8 is poor for pt her age) 2 minute walk test: 256 ft with pt taking one short rest break and being SOB at end of 2 minuts.    TODAY'S TREATMENT:                                                                                                                              DATE:  11/12/2022 Evaluation:  - Seated Long Arc Quad  - 5 reps - 5" hold - Sit to Stand  -  10 reps - Seated Transversus Abdominis Bracing x 5 hold 5" HOME EXERCISE PROGRAM: Access Code: [email or web address] URL: [email or web address] Date: 11/12/2022 Prepared by: Rayetta Humphrey  Exercises - Seated Long Arc Quad  - 2 x daily - 7 x weekly - 1 sets - 10 reps - 5" hold - Sit to Stand  - 2 x daily - 7 x weekly - 1 sets - 10 reps - Seated Transversus Abdominis Bracing  - 2 x daily - 7 x weekly - 1 sets - 10 reps - 5" hold  ASSESSMENT:  CLINICAL IMPRESSION: Patient is a 72 y.o. female who was seen today for physical therapy evaluation and treatment for low back pain.  Evaluation demonstrates increased pain, decreased activity tolerance, decreased strength, postural dysfunction and obesity which is impacting the pt functional level.  Ms. Sayres will benefit from skilled PT to address these issues and maximize her functional ability.   OBJECTIVE IMPAIRMENTS: decreased activity tolerance, decreased balance, difficulty walking, decreased ROM, decreased strength, impaired flexibility, postural dysfunction, obesity, and pain.   ACTIVITY LIMITATIONS: carrying, lifting, bending, sitting, standing, squatting, stairs, dressing, and locomotion level  PARTICIPATION LIMITATIONS: meal prep, cleaning, shopping, and community activity  PERSONAL FACTORS: Age, Behavior pattern, Fitness, Past/current experiences, and Time since onset of injury/illness/exacerbation are also affecting  patient's functional outcome.   REHAB POTENTIAL: Good  CLINICAL DECISION MAKING: Stable/uncomplicated  EVALUATION COMPLEXITY: Moderate   GOALS: Goals reviewed with patient? No  SHORT TERM GOALS: Target date: 12/03/22  PT to be I in HEP in order to decrease her pain to no greater than a 6/10 Baseline: Goal status: INITIAL  2.  PT to be able to walk for 4 minute without stopping  Baseline:  Goal status: INITIAL  3.  PT radicular sx to go to thigh level only Baseline:  Goal status: INITIAL  4.  PT LE strength to increase 1/2 grade to improve ease of standing up from the couch.  Baseline:  Goal status: INITIAL  5.     LONG TERM GOALS: Target date: 12/24/2022   PT to be I in  an advanced HEP in order to decrease her pain to no greater than a 4/10 Baseline:  Goal status: INITIAL  2.  PT to be able to walk for 10 minute without stopping Baseline:  Goal status: INITIAL  3.  PT to understand  and be using good body mechanics  Baseline:  Goal status: INITIAL  4.  PT radicular sx to go to buttock level only                                   5.        PT LE strength to increase 1 grade to allow reciprocal stair climbing  Baseline:  Goal status: INITIAL PLAN:  PT FREQUENCY: 2x/week  PT DURATION: 6 weeks  PLANNED INTERVENTIONS: Therapeutic exercises, Therapeutic activity, Neuromuscular re-education, Balance training, Gait training, Patient/Family education, Self Care, and Joint mobilization.  PLAN FOR NEXT SESSION: lumbar stabilization program, educate in body mechanics.   Rayetta Humphrey, PT CLT [phone]  11/12/2022, 4:55 PM

## 2022-11-17 DIAGNOSIS — L820 Inflamed seborrheic keratosis: Secondary | ICD-10-CM | POA: Diagnosis not present

## 2022-11-20 ENCOUNTER — Ambulatory Visit (HOSPITAL_COMMUNITY): Payer: Medicare HMO | Admitting: Physical Therapy

## 2022-11-20 DIAGNOSIS — M5459 Other low back pain: Secondary | ICD-10-CM | POA: Diagnosis not present

## 2022-11-20 DIAGNOSIS — M6281 Muscle weakness (generalized): Secondary | ICD-10-CM

## 2022-11-20 NOTE — Therapy (Signed)
OUTPATIENT PHYSICAL THERAPY THORACOLUMBAR Treatment   Patient Name: Ebony Stevens MRN: [id number] DOB:18-Jun-1951, 72 y.o., female Today's Date: 11/20/2022  END OF SESSION:  PT End of Session - 11/20/22 1600    Visit Number 2    Number of Visits 12    Date for PT Re-Evaluation 12/24/22    Authorization Type humana-authorization    Authorization Time Period 12 visits from 2/1 - 3/15    Authorization - Visit Number 2    Authorization - Number of Visits 12    Progress Note Due on Visit 10    PT Start Time 1520    PT Stop Time 1600    PT Time Calculation (min) 40 min    Activity Tolerance Patient tolerated treatment well    Behavior During Therapy [facility] for tasks assessed/performed             Past Medical History:  Diagnosis Date   Asthma    Chest pain 07/11/2015   Diabetes mellitus without complication ([place])    Dyspnea 07/11/2015   Early cirrhosis ([place]) 2020   Excessive bleeding in the premenopausal period    Fibromyalgia    GERD (gastroesophageal reflux disease)    HTN (hypertension)    Hypertension    Localized edema    Mixed hyperlipidemia    PONV (postoperative nausea and vomiting)    Sleep apnea    Type 2 diabetes mellitus ([place])    Past Surgical History:  Procedure Laterality Date   ABDOMINAL HYSTERECTOMY     1975 partial hysterectomy,  1978 "complete hysterctomy and retack bladder". states it didn't take.    BIOPSY  11/10/2018   Procedure: BIOPSY;  Surgeon: Daneil Dolin, MD;  [facility];  Service: Endoscopy;;  colon    CHOLECYSTECTOMY     COLONOSCOPY WITH PROPOFOL N/A 11/10/2018   Surgeon: Daneil Dolin, MD;  1 hyperplastic polyp, random colon biopsies benign, due for repeat in 2025 due to family history of colon cancer.   DILATION AND CURETTAGE OF UTERUS     ESOPHAGOGASTRODUODENOSCOPY (EGD) WITH PROPOFOL N/A 08/21/2021   Surgeon: Daneil Dolin, MD; Mild Schatzki's ring dilated, small hiatal hernia, no portal gastropathy, normal  examined duodenum.   [place] DILATION  08/21/2021   Procedure: MALONEY DILATION;  Surgeon: Daneil Dolin, MD;  [facility];  Service: Endoscopy;;   POLYPECTOMY  11/10/2018   Procedure: POLYPECTOMY;  Surgeon: Daneil Dolin, MD;  [facility];  Service: Endoscopy;;  colon    TONSILLECTOMY     Patient Active Problem List   Diagnosis Date Noted   Dysphagia 02/02/2022   Diarrhea 06/23/2021   Bradycardia 06/23/2021   Family history of colon cancer 09/26/2020   Early cirrhosis ([place]) 09/26/2019   Abdominal pain 09/26/2019   Chronic diarrhea 09/28/2018   Hepatomegaly 09/28/2018   Dyspnea 03/27/2016   Chest pain 03/27/2016   Asthma 03/27/2016   Allergic rhinitis 03/27/2016   Upper abdominal pain 03/27/2016   Dysthymic disorder 03/27/2016   Fibromyalgia 03/27/2016   Gastroesophageal reflux disease 03/27/2016   Generalized anxiety disorder 03/27/2016   Major depressive disorder 03/27/2016   Benign essential HTN 03/27/2016   Insomnia 03/27/2016   Obesity 03/27/2016   Contact dermatitis 03/27/2016   Diabetes mellitus without complication ([place]) [id number]   Mixed hyperlipidemia 03/27/2016   Acute bronchitis 03/27/2016   Cough 03/27/2016   Localized edema 03/27/2016   Menopausal disorder 03/27/2016    PCP: Allyn Kenner  REFERRING PROVIDER: Earnie Larsson,  MD  REFERRING DIAG:  M43.10 (ICD-10-CM) - Spondylolisthesis, site unspecified    Rationale for Evaluation and Treatment: Rehabilitation  THERAPY DIAG:  Other low back pain  Muscle weakness (generalized) Rt radiculopathy Muscle weakness  ONSET DATE: for years but she has got to the point she could not walk without pain.                                                                                                                                                                                             SUBJECTIVE STATEMENT: Pt states that she is doing her exercises once a day.   PERTINENT HISTORY:   As above   PAIN:  Are you having pain? Yes: NPRS scale: 6/10; past week pain has gone up to a 9/10; best is a 4/10  Pain location: RT low back radiating to the Foot but when weight bearing B Low back pain  Pain description: aching  Aggravating factors: activity Relieving factors: sitting   PRECAUTIONS: None  WEIGHT BEARING RESTRICTIONS: No  FALLS:  Has patient fallen in last 6 months? No  LIVING ENVIRONMENT: Lives with: lives with their family Lives in: House/apartment Stairs: Yes: External: 7 steps; can reach both Goes up steps one at a time  Has following equipment at home: None PATIENT GOALS: To be more mobile where she can get out and walk  NEXT MD VISIT: 12/03/2022  OBJECTIVE:   DIAGNOSTIC FINDINGS:  IMPRESSION: 1. Stable appearance of the lumbar spine. 2. Moderate facet hypertrophy at L3-4, L4-5, and L5-S1 without significant stenosis at these levels. 3. Shallow right paramedian disc protrusion at L1-2 without significant stenosis.     Electronically Signed   By: San Morelle M.D.   On: 08/10/2022 22:28    PATIENT SURVEYS:  FOTO 34  POSTURE: rounded shoulders, forward head, decreased lumbar lordosis, and increased thoracic kyphosis   LUMBAR ROM:   AROM eval  Flexion Fingers to lower shin 8" off floor ; reps increase pain, with pulling up more painful   Extension 25 reps no change  Right lateral flexion   Left lateral flexion   Right rotation   Left rotation    (Blank rows = not tested)   LOWER EXTREMITY MMT:    MMT Right eval Left eval  Hip flexion 3/5 4/5  Hip extension 3-/5 4/5  Hip abduction 5/5 4/5  Hip adduction    Hip internal rotation    Hip external rotation    Knee flexion 4/5 3/5  Knee extension 3+ 3+  Ankle dorsiflexion 3+ 3+  Ankle plantarflexion    Ankle inversion    Ankle eversion     (  Blank rows = not tested)    FUNCTIONAL TESTS:  30 seconds chair stand test:  6, (8 is poor for pt her age) 2 minute walk  test: 256 ft with pt taking one short rest break and being SOB at end of 2 minuts.    TODAY'S TREATMENT:                                                                                                                              DATE:  11/20/2022 Standing : Hip excursion x 10 Heel raise x 10 Functional squat x 10  Sitting : Sit to stand x 10  Scapular retraction x 10 Supine: Hip isometric adduction with ab set 5" x 10 Bridge with hip  adduction x 10 Bent knee lift x 10  Clam x 10 B  Nustep level 3 hills 3 x 5 minutes  11/12/2022 Evaluation:  - Seated Long Arc Quad  - 5 reps - 5" hold - Sit to Stand  -  10 reps - Seated Transversus Abdominis Bracing x 5 hold 5" HOME EXERCISE PROGRAM:             11/20/2022              - Seated Scapular Retraction  - 2 x daily - 7 x weekly - 1 sets - 10 reps - 5" hold - Supine Hip Adduction Isometric with Ball  - 2 x daily - 7 x weekly - 1 sets - 10 reps - 5" hold - Supine Bridge with Mini Swiss Ball Between Knees  - 2 x daily - 7 x weekly - 1 sets - 10 reps - 5" hold - Supine March  - 2 x daily - 7 x weekly - 1 sets - 10 reps - 5" hold Access Code: [email or web address] URL: [email or web address] Date: 11/12/2022 Prepared by: Rayetta Humphrey  Exercises - Seated Long Arc Quad  - 2 x daily - 7 x weekly - 1 sets - 10 reps - 5" hold - Sit to Stand  - 2 x daily - 7 x weekly - 1 sets - 10 reps - Seated Transversus Abdominis Bracing  - 2 x daily - 7 x weekly - 1 sets - 10 reps - 5" hold  ASSESSMENT:  CLINICAL IMPRESSION: Pt evaluation and goals reviewed with patient.  Therapist reviewed HEP and educated pt on supine stabilization with verbal cuing needed for proper technique.  Pt continues to demonstrates increased pain, decreased activity tolerance, decreased strength, postural dysfunction and obesity which is impacting the pt functional level.  Ms. Nassar will continue to benefit from skilled PT to address these issues and maximize her functional  ability.   OBJECTIVE IMPAIRMENTS: decreased activity tolerance, decreased balance, difficulty walking, decreased ROM, decreased strength, impaired flexibility, postural dysfunction, obesity, and pain.   ACTIVITY LIMITATIONS: carrying, lifting, bending, sitting, standing, squatting, stairs, dressing, and locomotion level  PARTICIPATION LIMITATIONS: meal prep, cleaning, shopping, and community activity  PERSONAL  FACTORS: Age, Behavior pattern, Fitness, Past/current experiences, and Time since onset of injury/illness/exacerbation are also affecting patient's functional outcome.   REHAB POTENTIAL: Good  CLINICAL DECISION MAKING: Stable/uncomplicated  EVALUATION COMPLEXITY: Moderate   GOALS: Goals reviewed with patient? No  SHORT TERM GOALS: Target date: 12/03/22  PT to be I in HEP in order to decrease her pain to no greater than a 6/10 Baseline: Goal status: IN PROGRESS  2.  PT to be able to walk for 4 minute without stopping  Baseline:  Goal status: IN PROGRESS  3.  PT radicular sx to go to thigh level only Baseline:  Goal status: IN PROGRESS  4.  PT LE strength to increase 1/2 grade to improve ease of standing up from the couch.  Baseline:  Goal status: IN PROGRESS      LONG TERM GOALS: Target date: 12/24/2022   PT to be I in an advanced HEP in order to decrease her pain to no greater than a 4/10 Baseline:  Goal status: IN PROGRESS  2.  PT to be able to walk for 10 minute without stopping Baseline:  Goal status: IN PROGRESS  3.  PT to understand  and be using good body mechanics  Baseline:  Goal status: IN PROGRESS  4.  PT radicular sx to go to buttock level only                                   5.        PT LE strength to increase 1 grade to allow reciprocal stair climbing  Baseline:  Goal status: in progress PLAN:  PT FREQUENCY: 2x/week  PT DURATION: 6 weeks  PLANNED INTERVENTIONS: Therapeutic exercises, Therapeutic activity, Neuromuscular  re-education, Balance training, Gait training, Patient/Family education, Self Care, and Joint mobilization.  PLAN FOR NEXT SESSION: lumbar stabilization program, educate in body mechanics.   Rayetta Humphrey, PT CLT [phone]  11/20/2022, 1600 PM

## 2022-11-25 ENCOUNTER — Encounter (HOSPITAL_COMMUNITY): Payer: Medicare HMO | Admitting: Physical Therapy

## 2022-11-25 ENCOUNTER — Ambulatory Visit (HOSPITAL_COMMUNITY): Payer: Medicare HMO | Admitting: Physical Therapy

## 2022-11-25 DIAGNOSIS — M5459 Other low back pain: Secondary | ICD-10-CM

## 2022-11-25 DIAGNOSIS — M6281 Muscle weakness (generalized): Secondary | ICD-10-CM

## 2022-11-25 NOTE — Therapy (Signed)
OUTPATIENT PHYSICAL THERAPY THORACOLUMBAR Treatment   Patient Name: Ebony Stevens MRN: [id number] DOB:Mar 01, 1951, 72 y.o., female Today's Date: 11/25/2022  END OF SESSION:  PT End of Session - 11/25/22 1552     Visit Number 3    Number of Visits 12    Date for PT Re-Evaluation 12/24/22    Authorization Type humana-authorization    Authorization Time Period 12 visits from 2/1 - 3/15    Authorization - Visit Number 3    Authorization - Number of Visits 12    Progress Note Due on Visit 10    PT Start Time 1507    PT Stop Time 1552    PT Time Calculation (min) 45 min    Activity Tolerance Patient tolerated treatment well    Behavior During Therapy [facility] for tasks assessed/performed               Past Medical History:  Diagnosis Date   Asthma    Chest pain 07/11/2015   Diabetes mellitus without complication ([place])    Dyspnea 07/11/2015   Early cirrhosis ([place]) 2020   Excessive bleeding in the premenopausal period    Fibromyalgia    GERD (gastroesophageal reflux disease)    HTN (hypertension)    Hypertension    Localized edema    Mixed hyperlipidemia    PONV (postoperative nausea and vomiting)    Sleep apnea    Type 2 diabetes mellitus ([place])    Past Surgical History:  Procedure Laterality Date   ABDOMINAL HYSTERECTOMY     1975 partial hysterectomy,  1978 "complete hysterctomy and retack bladder". states it didn't take.    BIOPSY  11/10/2018   Procedure: BIOPSY;  Surgeon: Daneil Dolin, MD;  [facility];  Service: Endoscopy;;  colon    CHOLECYSTECTOMY     COLONOSCOPY WITH PROPOFOL N/A 11/10/2018   Surgeon: Daneil Dolin, MD;  1 hyperplastic polyp, random colon biopsies benign, due for repeat in 2025 due to family history of colon cancer.   DILATION AND CURETTAGE OF UTERUS     ESOPHAGOGASTRODUODENOSCOPY (EGD) WITH PROPOFOL N/A 08/21/2021   Surgeon: Daneil Dolin, MD; Mild Schatzki's ring dilated, small hiatal hernia, no portal gastropathy, normal  examined duodenum.   [place] DILATION  08/21/2021   Procedure: MALONEY DILATION;  Surgeon: Daneil Dolin, MD;  [facility];  Service: Endoscopy;;   POLYPECTOMY  11/10/2018   Procedure: POLYPECTOMY;  Surgeon: Daneil Dolin, MD;  [facility];  Service: Endoscopy;;  colon    TONSILLECTOMY     Patient Active Problem List   Diagnosis Date Noted   Dysphagia 02/02/2022   Diarrhea 06/23/2021   Bradycardia 06/23/2021   Family history of colon cancer 09/26/2020   Early cirrhosis ([place]) 09/26/2019   Abdominal pain 09/26/2019   Chronic diarrhea 09/28/2018   Hepatomegaly 09/28/2018   Dyspnea 03/27/2016   Chest pain 03/27/2016   Asthma 03/27/2016   Allergic rhinitis 03/27/2016   Upper abdominal pain 03/27/2016   Dysthymic disorder 03/27/2016   Fibromyalgia 03/27/2016   Gastroesophageal reflux disease 03/27/2016   Generalized anxiety disorder 03/27/2016   Major depressive disorder 03/27/2016   Benign essential HTN 03/27/2016   Insomnia 03/27/2016   Obesity 03/27/2016   Contact dermatitis 03/27/2016   Diabetes mellitus without complication ([place]) [id number]   Mixed hyperlipidemia 03/27/2016   Acute bronchitis 03/27/2016   Cough 03/27/2016   Localized edema 03/27/2016   Menopausal disorder 03/27/2016    PCP: Allyn Kenner  REFERRING  PROVIDER: Earnie Larsson, MD  REFERRING DIAG:  M43.10 (ICD-10-CM) - Spondylolisthesis, site unspecified    Rationale for Evaluation and Treatment: Rehabilitation  THERAPY DIAG:  Other low back pain  Muscle weakness (generalized) Rt radiculopathy Muscle weakness  ONSET DATE: for years but she has got to the point she could not walk without pain.                                                                                                                                                                                             SUBJECTIVE STATEMENT:   PT states that she has not done a lot today so her pain is down.  She has  no questions on the exercises.  PERTINENT HISTORY:  As above   PAIN:  Are you having pain? Yes: NPRS scale: 4/10; past week pain has gone up to a 9/10; best is a 4/10  Pain location: RT low back radiating to the Foot but when weight bearing B Low back pain  Pain description: aching  Aggravating factors: activity Relieving factors: sitting   PRECAUTIONS: None  WEIGHT BEARING RESTRICTIONS: No  FALLS:  Has patient fallen in last 6 months? No  LIVING ENVIRONMENT: Lives with: lives with their family Lives in: House/apartment Stairs: Yes: External: 7 steps; can reach both Goes up steps one at a time  Has following equipment at home: None PATIENT GOALS: To be more mobile where she can get out and walk  NEXT MD VISIT: 12/03/2022  OBJECTIVE:   DIAGNOSTIC FINDINGS:  IMPRESSION: 1. Stable appearance of the lumbar spine. 2. Moderate facet hypertrophy at L3-4, L4-5, and L5-S1 without significant stenosis at these levels. 3. Shallow right paramedian disc protrusion at L1-2 without significant stenosis.     Electronically Signed   By: San Morelle M.D.   On: 08/10/2022 22:28    PATIENT SURVEYS:  FOTO 34  POSTURE: rounded shoulders, forward head, decreased lumbar lordosis, and increased thoracic kyphosis   LUMBAR ROM:   AROM eval  Flexion Fingers to lower shin 8" off floor ; reps increase pain, with pulling up more painful   Extension 25 reps no change  Right lateral flexion   Left lateral flexion   Right rotation   Left rotation    (Blank rows = not tested)   LOWER EXTREMITY MMT:    MMT Right eval Left eval  Hip flexion 3/5 4/5  Hip extension 3-/5 4/5  Hip abduction 5/5 4/5  Hip adduction    Hip internal rotation    Hip external rotation    Knee flexion 4/5 3/5  Knee extension 3+ 3+  Ankle  dorsiflexion 3+ 3+  Ankle plantarflexion    Ankle inversion    Ankle eversion     (Blank rows = not tested)    FUNCTIONAL TESTS:  30 seconds chair stand  test:  6, (8 is poor for pt her age) 2 minute walk test: 256 ft with pt taking one short rest break and being SOB at end of 2 minuts.    TODAY'S TREATMENT:                                                                                                                              DATE:  11/25/2022 Nustep level 3 hills 3  x 7:00 Leg press 4 plates x 10 reps  Walk backward with resistance; 3 plates x 3 reps  Heel raises x 15 Functional squat with RT side bend squat , left side bend x 10 Hip excursion x 3  Postural exercises green theraband Scapular retraction x 10 Rows x 10 Extension x 10  Marching with one hand hold x 10  Sit to stand x 10 Thoracic excursion x 3  Supine: Clam x 10 with red theraband  Bridge with adduction x 15 3" Hip flexion isometric x 5 x 5" Rt and then LT    11/20/2022 Standing : Hip excursion x 10 Heel raise x 10 Functional squat x 10  Sitting : Sit to stand x 10  Scapular retraction x 10 Supine: Hip isometric adduction with ab set 5" x 10 Bridge with hip  adduction x 10 Bent knee lift x 10  Clam x 10 B  Nustep level 3 hills 3 x 5 minutes  11/12/2022 Evaluation:  - Seated Long Arc Quad  - 5 reps - 5" hold - Sit to Stand  -  10 reps - Seated Transversus Abdominis Bracing x 5 hold 5" HOME EXERCISE PROGRAM: 11/25/22 - Hooklying Isometric Clamshell  - 1 x daily - 7 x weekly - 1 sets - 10-15 reps             11/20/2022              - Seated Scapular Retraction  - 2 x daily - 7 x weekly - 1 sets - 10 reps - 5" hold - Supine Hip Adduction Isometric with Ball  - 2 x daily - 7 x weekly - 1 sets - 10 reps - 5" hold - Supine Bridge with Mini Swiss Ball Between Knees  - 2 x daily - 7 x weekly - 1 sets - 10 reps - 5" hold - Supine March  - 2 x daily - 7 x weekly - 1 sets - 10 reps - 5" hold Access Code: [email or web address] URL: [email or web address] Date: 11/12/2022 Prepared by: Rayetta Humphrey  Exercises - Seated Long Arc Quad  - 2 x daily - 7 x weekly -  1 sets - 10 reps - 5" hold - Sit to Stand  - 2 x daily - 7 x  weekly - 1 sets - 10 reps - Seated Transversus Abdominis Bracing  - 2 x daily - 7 x weekly - 1 sets - 10 reps - 5" hold  ASSESSMENT:  CLINICAL IMPRESSION: Added standing and machine work to improve gluteal maximus and postural mm strength.  Reviewed body mechanics for bed mobility.   Pt continues to demonstrates increased pain, decreased activity tolerance, decreased strength, postural dysfunction and obesity which is impacting the pt functional level.  Ms. Edley will continue to benefit from skilled PT to address these issues and maximize her functional ability.   OBJECTIVE IMPAIRMENTS: decreased activity tolerance, decreased balance, difficulty walking, decreased ROM, decreased strength, impaired flexibility, postural dysfunction, obesity, and pain.   ACTIVITY LIMITATIONS: carrying, lifting, bending, sitting, standing, squatting, stairs, dressing, and locomotion level  PARTICIPATION LIMITATIONS: meal prep, cleaning, shopping, and community activity  PERSONAL FACTORS: Age, Behavior pattern, Fitness, Past/current experiences, and Time since onset of injury/illness/exacerbation are also affecting patient's functional outcome.   REHAB POTENTIAL: Good  CLINICAL DECISION MAKING: Stable/uncomplicated  EVALUATION COMPLEXITY: Moderate   GOALS: Goals reviewed with patient? No  SHORT TERM GOALS: Target date: 12/03/22  PT to be I in HEP in order to decrease her pain to no greater than a 6/10 Baseline: Goal status: IN PROGRESS  2.  PT to be able to walk for 4 minute without stopping  Baseline:  Goal status: IN PROGRESS  3.  PT radicular sx to go to thigh level only Baseline:  Goal status: IN PROGRESS  4.  PT LE strength to increase 1/2 grade to improve ease of standing up from the couch.  Baseline:  Goal status: IN PROGRESS      LONG TERM GOALS: Target date: 12/24/2022   PT to be I in an advanced HEP in order to  decrease her pain to no greater than a 4/10 Baseline:  Goal status: IN PROGRESS  2.  PT to be able to walk for 10 minute without stopping Baseline:  Goal status: IN PROGRESS  3.  PT to understand  and be using good body mechanics  Baseline:  Goal status: IN PROGRESS  4.  PT radicular sx to go to buttock level only                                   5.        PT LE strength to increase 1 grade to allow reciprocal stair climbing  Baseline:  Goal status: in progress PLAN:  PT FREQUENCY: 2x/week  PT DURATION: 6 weeks  PLANNED INTERVENTIONS: Therapeutic exercises, Therapeutic activity, Neuromuscular re-education, Balance training, Gait training, Patient/Family education, Self Care, and Joint mobilization.  PLAN FOR NEXT SESSION: lumbar stabilization program, educate in body mechanics for lifting .   Rayetta Humphrey, PT CLT [phone]  11/25/2022, 1553 PM

## 2022-11-27 ENCOUNTER — Encounter (HOSPITAL_COMMUNITY): Payer: Medicare HMO | Admitting: Physical Therapy

## 2022-12-01 ENCOUNTER — Encounter (HOSPITAL_COMMUNITY): Payer: Medicare HMO | Admitting: Physical Therapy

## 2022-12-03 DIAGNOSIS — M4310 Spondylolisthesis, site unspecified: Secondary | ICD-10-CM | POA: Diagnosis not present

## 2022-12-03 DIAGNOSIS — Z6841 Body Mass Index (BMI) 40.0 and over, adult: Secondary | ICD-10-CM | POA: Diagnosis not present

## 2022-12-04 ENCOUNTER — Encounter (HOSPITAL_COMMUNITY): Payer: Medicare HMO | Admitting: Physical Therapy

## 2022-12-16 DIAGNOSIS — E1165 Type 2 diabetes mellitus with hyperglycemia: Secondary | ICD-10-CM | POA: Diagnosis not present

## 2022-12-16 DIAGNOSIS — I10 Essential (primary) hypertension: Secondary | ICD-10-CM | POA: Diagnosis not present

## 2022-12-16 DIAGNOSIS — E782 Mixed hyperlipidemia: Secondary | ICD-10-CM | POA: Diagnosis not present

## 2022-12-22 DIAGNOSIS — J449 Chronic obstructive pulmonary disease, unspecified: Secondary | ICD-10-CM | POA: Diagnosis not present

## 2022-12-22 DIAGNOSIS — Z0001 Encounter for general adult medical examination with abnormal findings: Secondary | ICD-10-CM | POA: Diagnosis not present

## 2022-12-22 DIAGNOSIS — I5032 Chronic diastolic (congestive) heart failure: Secondary | ICD-10-CM | POA: Diagnosis not present

## 2022-12-22 DIAGNOSIS — I130 Hypertensive heart and chronic kidney disease with heart failure and stage 1 through stage 4 chronic kidney disease, or unspecified chronic kidney disease: Secondary | ICD-10-CM | POA: Diagnosis not present

## 2022-12-22 DIAGNOSIS — F32A Depression, unspecified: Secondary | ICD-10-CM | POA: Diagnosis not present

## 2022-12-22 DIAGNOSIS — N1831 Chronic kidney disease, stage 3a: Secondary | ICD-10-CM | POA: Diagnosis not present

## 2022-12-22 DIAGNOSIS — I129 Hypertensive chronic kidney disease with stage 1 through stage 4 chronic kidney disease, or unspecified chronic kidney disease: Secondary | ICD-10-CM | POA: Diagnosis not present

## 2022-12-22 DIAGNOSIS — Z0000 Encounter for general adult medical examination without abnormal findings: Secondary | ICD-10-CM | POA: Diagnosis not present

## 2022-12-22 DIAGNOSIS — E1122 Type 2 diabetes mellitus with diabetic chronic kidney disease: Secondary | ICD-10-CM | POA: Diagnosis not present

## 2022-12-22 DIAGNOSIS — R800 Isolated proteinuria: Secondary | ICD-10-CM | POA: Diagnosis not present

## 2022-12-24 DIAGNOSIS — C755 Malignant neoplasm of aortic body and other paraganglia: Secondary | ICD-10-CM | POA: Diagnosis not present

## 2022-12-29 DIAGNOSIS — M48062 Spinal stenosis, lumbar region with neurogenic claudication: Secondary | ICD-10-CM | POA: Diagnosis not present

## 2023-02-03 DIAGNOSIS — M48062 Spinal stenosis, lumbar region with neurogenic claudication: Secondary | ICD-10-CM | POA: Diagnosis not present

## 2023-02-03 DIAGNOSIS — Z6841 Body Mass Index (BMI) 40.0 and over, adult: Secondary | ICD-10-CM | POA: Diagnosis not present

## 2023-02-04 ENCOUNTER — Ambulatory Visit: Payer: Medicare HMO | Admitting: Gastroenterology

## 2023-02-04 ENCOUNTER — Telehealth: Payer: Self-pay | Admitting: *Deleted

## 2023-02-04 ENCOUNTER — Encounter: Payer: Self-pay | Admitting: Gastroenterology

## 2023-02-04 VITALS — BP 140/71 | HR 61 | Temp 97.3°F | Ht 61.5 in | Wt 285.2 lb

## 2023-02-04 DIAGNOSIS — K219 Gastro-esophageal reflux disease without esophagitis: Secondary | ICD-10-CM

## 2023-02-04 DIAGNOSIS — K529 Noninfective gastroenteritis and colitis, unspecified: Secondary | ICD-10-CM | POA: Diagnosis not present

## 2023-02-04 DIAGNOSIS — K7460 Unspecified cirrhosis of liver: Secondary | ICD-10-CM

## 2023-02-04 DIAGNOSIS — R1310 Dysphagia, unspecified: Secondary | ICD-10-CM

## 2023-02-04 NOTE — Progress Notes (Signed)
GI Office Note    Referring Provider: Benita Stabile, MD Primary Care Physician:  Benita Stabile, MD Primary Gastroenterologist: Gerrit Friends.Rourk, MD  Date:  02/04/2023  ID:  Ebony Stevens, DOB [birth year], MRN [id number]   Chief Complaint   Chief Complaint  Patient presents with   Follow-up    GERD, liver and says swallowing has gotten better .   History of Present Illness  Ebony Stevens is a 72 y.o. female with a history of  early cirrhosis (F3 + F4 on elastography in 2020) thought to be secondary to NAFLD/NASH with benign extensive work-up, liver lesion on MRI many years ago at [facility], cholecystectomy, GERD, chronic diarrhea, diabetes, HLD, HTN, asthma and Schatzki's ring presenting today for follow-up.   CT in May 2019 with hepatic steatosis, hepatomegaly, and area of chronic volume loss in the left lobe of the liver with new calcification and pneumobilia likely due to evolution of remote insult related to cholecystectomy.  AFP normal in the past.  Chronic diarrhea previously managed with Questran and antidiarrheals.  Has had benign random colon biopsies and celiac screening.   Colonoscopy in 2020 with 1 hyperplastic polyp, benign random colon biopsies.  Due to repeat in 2025 due to family history of colon cancer.   Ultrasound September 2022 with fatty liver, superimposed inflammation or fibrosis not excluded, patent portal vein, evidence of cholecystectomy, 4 cm right renal angiomyolipoma.  Labs revealed a MELD score of 6.  She was found to be nonimmune to hepatitis A.  TSH stool studies negative.   EGD 08/21/2021: Not Schatzki's ring s/p dilation, small to hernia, no portal gastropathy, normal duodenum.  Recommend starting on Protonix 40 mg daily.   Last office visit 02/02/2022.  No signs of decompensation.  Reported chronic intermittent lower extremity edema that has been unchanged.  Maintained on Lasix 40 mg daily.  Reported able to lose weight, having trouble going to bed at night due  to cravings for sweets.  Had completed hepatitis B vaccination and has started her hepatitis A vaccination series.  GERD controlled on Protonix 4 mg daily with improvement in her cough.  Swallowing improved but still with intermittent symptoms usually with meat, breads, and vegetables.  Prefer to wait on barium pill esophagram.  Reported improvement and diarrhea, was using Imodium as needed.   Abdominal ultrasound in April 2023 with fatty liver, no focal liver lesions present. Labs in April 2023 all within normal limits.  MELD Na: 6  Today:  Cirrhosis history Hematemesis/coffee ground emesis: None  History of variceal bleeding: none Abdominal pain: none - having more back pain Abdominal distention/worsening ascites: none Fever/chills: none. Having a lot of congestion, seasonal allergies Episodes of confusion/disorientation: none Number of daily bowel movements: has a couple small normal stools but after meals she will have some diarrhea.  Taking diuretics?: lasix 40 mg once daily Date of last EGD: Novemebr 2022 Prior history of banding?: none Prior episodes of SBP: none Last time liver imaging was performed: April 2023  Reports her memory is worsening.   Hepatitis A and B vaccination status: completed  Computed MELD 3.0 unavailable. Necessary lab results were not found in the last year. MELD-Na: 6 at 08/04/2022  3:23 PM Calculated from: Serum Creatinine: 0.98 mg/dL (Using min of 1 mg/dL) at 40/98/1191  4:78 PM Serum Sodium: 139 mmol/L (Using max of 137 mmol/L) at 08/04/2022  3:23 PM Total Bilirubin: 0.5 mg/dL (Using min of 1 mg/dL) at 29/56/2130  8:65 PM INR(ratio): 1.0  at 08/04/2022  3:23 PM  GERD: Managing well on daily PPI.  Dysphagia: States things are getting stuck in her throat at times but is improved since her last stretching. States it seems to happen more in public but unsure trigger.   Diarrhea: Has lower abdominal pain. Has intermittent constipation and diarrhea. Has  been leaning more toward diarrhea. Eats a lot of vegetables and fruits. Using imodium seldomly.   Is not able to do a lot of physical activity. Did PT and then was having worse back pain so she stopped. Tries to use distraction to help with pain. States weight loss has been difficult given her difficulty with physical activity.    Current Outpatient Medications  Medication Sig Dispense Refill   acetaminophen (TYLENOL) 500 MG tablet Take 500 mg by mouth every 6 (six) hours as needed for moderate pain.     aspirin EC 325 MG tablet Take 325 mg by mouth daily.     atorvastatin (LIPITOR) 40 MG tablet Take 40 mg by mouth daily.     Calcium Carbonate-Vitamin D (CALTRATE 600+D PO) Take 1 tablet by mouth daily.     cetirizine (ZYRTEC) 10 MG tablet Take 10 mg by mouth daily.     estradiol (ESTRACE) 1 MG tablet Take 1 tablet by mouth at bedtime.     furosemide (LASIX) 40 MG tablet TAKE ONE TABLET BY MOUTH EVERY OTHER DAY (Patient taking differently: Take 40 mg by mouth daily.) 15 tablet 2   glipiZIDE (GLUCOTROL) 10 MG tablet Take 10 mg by mouth 2 (two) times daily before a meal.     insulin NPH-regular Human (70-30) 100 UNIT/ML injection Inject 60-70 Units into the skin at bedtime.     insulin regular (NOVOLIN R) 100 units/mL injection Inject 10 Units into the skin 3 (three) times daily after meals.     lisinopril (PRINIVIL,ZESTRIL) 40 MG tablet Take 40 mg by mouth daily.     loperamide (IMODIUM A-D) 2 MG tablet Take 2 mg by mouth as needed for diarrhea or loose stools.     metFORMIN (GLUCOPHAGE) 1000 MG tablet Take 1,000 mg by mouth 2 (two) times daily with a meal.     metoprolol succinate (TOPROL-XL) 100 MG 24 hr tablet Take 50 mg by mouth daily. Take with or immediately following a meal.     pantoprazole (PROTONIX) 40 MG tablet TAKE 1 TABLET EVERY DAY BEFORE BREAKFAST 90 tablet 3   potassium chloride (K-DUR) 10 MEQ tablet TAKE ONE TABLET BY MOUTH EVERY OTHER DAY (Patient taking differently: Take 10 mEq  by mouth daily.) 15 tablet 2   albuterol (VENTOLIN HFA) 108 (90 Base) MCG/ACT inhaler Inhale 1-2 puffs into the lungs every 6 (six) hours as needed for wheezing or shortness of breath. (Patient not taking: Reported on 08/04/2022)     No current facility-administered medications for this visit.    Past Medical History:  Diagnosis Date   Asthma    Chest pain 07/11/2015   Diabetes mellitus without complication    Dyspnea 07/11/2015   Early cirrhosis 2020   Excessive bleeding in the premenopausal period    Fibromyalgia    GERD (gastroesophageal reflux disease)    HTN (hypertension)    Hypertension    Localized edema    Mixed hyperlipidemia    PONV (postoperative nausea and vomiting)    Sleep apnea    Type 2 diabetes mellitus     Past Surgical History:  Procedure Laterality Date   ABDOMINAL HYSTERECTOMY  1975 partial hysterectomy,  1978 "complete hysterctomy and retack bladder". states it didn't take.    BIOPSY  11/10/2018   Procedure: BIOPSY;  Surgeon: Corbin Ade, MD;  [facility];  Service: Endoscopy;;  colon    CHOLECYSTECTOMY     COLONOSCOPY WITH PROPOFOL N/A 11/10/2018   Surgeon: Corbin Ade, MD;  1 hyperplastic polyp, random colon biopsies benign, due for repeat in 2025 due to family history of colon cancer.   DILATION AND CURETTAGE OF UTERUS     ESOPHAGOGASTRODUODENOSCOPY (EGD) WITH PROPOFOL N/A 08/21/2021   Surgeon: Corbin Ade, MD; Mild Schatzki's ring dilated, small hiatal hernia, no portal gastropathy, normal examined duodenum.   MALONEY DILATION  08/21/2021   Procedure: MALONEY DILATION;  Surgeon: Corbin Ade, MD;  [facility];  Service: Endoscopy;;   POLYPECTOMY  11/10/2018   Procedure: POLYPECTOMY;  Surgeon: Corbin Ade, MD;  [facility];  Service: Endoscopy;;  colon    TONSILLECTOMY      Family History  Problem Relation Age of Onset   Cancer Sister        ovarian   Diabetes Brother    Colon cancer  Brother 61       Passed from [facility] after 1 year    Allergies as of 02/04/2023   (No Known Allergies)    Social History   Socioeconomic History   Marital status: Married    Spouse name: Not on file   Number of children: Not on file   Years of education: Not on file   Highest education level: Not on file  Occupational History   Not on file  Tobacco Use   Smoking status: Never   Smokeless tobacco: Never  Vaping Use   Vaping Use: Never used  Substance and Sexual Activity   Alcohol use: No   Drug use: No   Sexual activity: Not Currently  Other Topics Concern   Not on file  Social History Narrative   ** Merged History Encounter **       Social Determinants of Health   Financial Resource Strain: Not on file  Food Insecurity: Not on file  Transportation Needs: Not on file  Physical Activity: Not on file  Stress: Not on file  Social Connections: Not on file     Review of Systems   Gen: Denies fever, chills, anorexia. Denies fatigue, weakness, weight loss.  CV: Denies chest pain, palpitations, syncope, peripheral edema, and claudication. Resp: Denies dyspnea at rest, cough, wheezing, coughing up blood, and pleurisy. GI: See HPI Derm: Denies rash, itching, dry skin Psych: Denies depression, anxiety, memory loss, confusion. No homicidal or suicidal ideation.  Heme: Denies bruising, bleeding, and enlarged lymph nodes.   Physical Exam   BP (!) 140/71 (BP Location: Right Arm, Patient Position: Sitting, Cuff Size: Large)   Pulse 61   Temp (!) 97.3 F (36.3 C) (Temporal)   Ht 5' 1.5" (1.562 m)   Wt 285 lb 3.2 oz (129.4 kg)   SpO2 97%   BMI 53.02 kg/m   General:   Alert and oriented. No distress noted. Pleasant and cooperative.  Head:  Normocephalic and atraumatic. Eyes:  Conjuctiva clear without scleral icterus. Mouth:  Oral mucosa pink and moist. Good dentition. No lesions. Lungs:  Clear to auscultation bilaterally. No wheezes, rales, or rhonchi. No distress.   Heart:  S1, S2 present without murmurs appreciated.  Abdomen:  +BS, soft, non-tender and non-distended. Exam difficult given body habitus. No  rebound or guarding. No HSM or masses noted. Rectal: deferred Msk:  Symmetrical without gross deformities. Normal posture. Extremities:  +1 pitting edema bilaterally Neurologic:  Alert and  oriented x4 Psych:  Alert and cooperative. Normal mood and affect.   Assessment  Ebony Stevens is a 72 y.o. female with a history of early cirrhosis (F3 + F4 on elastography in 2020) thought to be secondary to NAFLD/NASH with benign extensive work-up, liver lesion on MRI many years ago at [facility], cholecystectomy, GERD, chronic diarrhea, diabetes, HLD, HTN, asthma and Schatzki's ring presenting today for follow-up.   Cirrhosis: Most likely secondary to metabolic associated steatotic liver disease (MASLD/MASH).  Had F3+F4 on elastography in 2020.  MELD Na stable at 6 as of labs from October 2023.  EGD up-to-date from November 2022 without evidence of varices or portal hypertensive gastropathy.  She is vaccinated for hepatitis A and B  Has had difficulty losing weight due to chronic pain and back injury.  Has not lost any weight since her last office visit.  Her weight is up 20 pounds from this time last year. Overdue for hepatoma screening, she has agreed to schedule repeat elastography.  Will order MELD labs.  Reinforced low-sodium, heart healthy diet..  She has been experiencing an increase of lower extremity swelling, currently on Lasix 40 mg daily.  No evidence of hyperkalemia or decreased renal function and likely will start on spironolactone.  Ebony Stevens,Ebony Stevens: Mild Schatzki's ring s/p dilation in November 2022.  Has intermittent feeling of food getting stuck in her throat however significantly improved after dilation in 2022.  Currently not in need of repeat EGD.  GERD well-controlled on pantoprazole 40 mg daily.  Chronic diarrhea: Has intermittent lower abdominal pain.   Has alternating constipation and diarrhea however more recently has been [place] for diarrhea.  Imodium only seldomly.  Admits to eating lots of fruits and vegetables which could be contributing.  Will continue current regimen.  PLAN   RUQ [place] with elastography CBC, CMP, INR If kidney function stable then will send in spironolactone.  Continue pantoprazole 40 mg daily.  GERD diet Conitnue imodium as needed for diarrhea.  Cirrhosis diet, low sodium, heart healthy Follow up in 6 months   Brooke Bonito, MSN, FNP-BC, AGACNP-BC [facility]

## 2023-02-04 NOTE — Patient Instructions (Addendum)
Please complete labs at [facility]. If your labs are stable I will send in an additional fluid pill called spironolactone to help with your lower extremity swelling.   We will schedule you for an ultrasound of your liver.   Cirrhosis Lifestyle Recommendations:  High-protein diet from a primarily plant-based diet. Avoid red meat.  No raw or undercooked meat, seafood, or shellfish. Low-fat/cholesterol/carbohydrate diet. Limit sodium to no more than 2000 mg/day including everything that you eat and drink. Recommend at least 30 minutes of aerobic and resistance exercise 3 days/week. Limit Tylenol to 2000 mg daily.   Continue pantoprazole 40 mg once daily.   Continue imodium as needed.   Follow up in 6 months, sooner if needed.   It was a pleasure to see you today. I want to create trusting relationships with patients. If you receive a survey regarding your visit,  I greatly appreciate you taking time to fill this out on paper or through your MyChart. I value your feedback.  Brooke Bonito, MSN, FNP-BC, AGACNP-BC [facility]

## 2023-02-04 NOTE — Telephone Encounter (Signed)
LMOVM to call back to give [place] appt.  5/14, arrival 915am, npo midnight

## 2023-02-05 LAB — COMPREHENSIVE METABOLIC PANEL
AG Ratio: 1.6 (calc) (ref 1.0–2.5)
ALT: 18 U/L (ref 6–29)
AST: 22 U/L (ref 10–35)
Albumin: 4.1 g/dL (ref 3.6–5.1)
Alkaline phosphatase (APISO): 102 U/L (ref 37–153)
BUN/Creatinine Ratio: 16 (calc) (ref 6–22)
BUN: 18 mg/dL (ref 7–25)
CO2: 27 mmol/L (ref 20–32)
Calcium: 8.8 mg/dL (ref 8.6–10.4)
Chloride: 102 mmol/L (ref 98–110)
Creat: 1.11 mg/dL — ABNORMAL HIGH (ref 0.60–1.00)
Globulin: 2.5 g/dL (calc) (ref 1.9–3.7)
Glucose, Bld: 79 mg/dL (ref 65–99)
Potassium: 4.3 mmol/L (ref 3.5–5.3)
Sodium: 138 mmol/L (ref 135–146)
Total Bilirubin: 0.5 mg/dL (ref 0.2–1.2)
Total Protein: 6.6 g/dL (ref 6.1–8.1)

## 2023-02-05 LAB — CBC
HCT: 42.1 % (ref 35.0–45.0)
Hemoglobin: 13.7 g/dL (ref 11.7–15.5)
MCH: 28.2 pg (ref 27.0–33.0)
MCHC: 32.5 g/dL (ref 32.0–36.0)
MCV: 86.8 fL (ref 80.0–100.0)
MPV: 10.1 fL (ref 7.5–12.5)
Platelets: 214 10*3/uL (ref 140–400)
RBC: 4.85 10*6/uL (ref 3.80–5.10)
RDW: 13 % (ref 11.0–15.0)
WBC: 7.2 10*3/uL (ref 3.8–10.8)

## 2023-02-05 LAB — PROTIME-INR
INR: 1
Prothrombin Time: 10.4 s (ref 9.0–11.5)

## 2023-02-07 MED ORDER — SPIRONOLACTONE 25 MG PO TABS: 25.0000 mg | ORAL_TABLET | Freq: Every day | ORAL | 1 refills | Status: DC

## 2023-02-08 NOTE — Telephone Encounter (Signed)
Pt informed of appt date and time of ultrasound. Verbalized understanding.

## 2023-02-15 ENCOUNTER — Other Ambulatory Visit: Payer: Self-pay | Admitting: *Deleted

## 2023-02-15 DIAGNOSIS — K7460 Unspecified cirrhosis of liver: Secondary | ICD-10-CM

## 2023-02-23 ENCOUNTER — Ambulatory Visit (HOSPITAL_COMMUNITY)
Admission: RE | Admit: 2023-02-23 | Discharge: 2023-02-23 | Disposition: A | Discharge status: Home / Self Care | Payer: Medicare HMO | Source: Ambulatory Visit | Attending: Gastroenterology | Admitting: Gastroenterology

## 2023-02-23 DIAGNOSIS — Z8719 Personal history of other diseases of the digestive system: Secondary | ICD-10-CM | POA: Diagnosis not present

## 2023-02-23 DIAGNOSIS — K7460 Unspecified cirrhosis of liver: Secondary | ICD-10-CM

## 2023-02-23 DIAGNOSIS — K7689 Other specified diseases of liver: Secondary | ICD-10-CM | POA: Diagnosis not present

## 2023-03-10 ENCOUNTER — Telehealth: Payer: Self-pay | Admitting: *Deleted

## 2023-03-10 NOTE — Telephone Encounter (Signed)
Pt called and states that spironolactone is not working. She states she is still having a lot of swelling in her legs and ankles. Please advise.

## 2023-03-16 NOTE — Telephone Encounter (Signed)
LMOM for pt to call office  

## 2023-03-17 ENCOUNTER — Other Ambulatory Visit: Payer: Self-pay | Admitting: *Deleted

## 2023-03-17 ENCOUNTER — Other Ambulatory Visit (INDEPENDENT_AMBULATORY_CARE_PROVIDER_SITE_OTHER): Payer: Self-pay | Admitting: Gastroenterology

## 2023-03-17 DIAGNOSIS — K7460 Unspecified cirrhosis of liver: Secondary | ICD-10-CM

## 2023-03-17 MED ORDER — SPIRONOLACTONE 50 MG PO TABS: 50.0000 mg | ORAL_TABLET | Freq: Every day | ORAL | 11 refills | Status: DC

## 2023-03-17 NOTE — Telephone Encounter (Signed)
Spoke to pt, informed her of recommendations. Pt voiced understanding. Pt states she is still having some swelling and some shortness of breath. She states she gets tired fast. I informed her that she may need to go to ED if swelling gets worse and her breathing gets worse. Labs entered into Epic. She also needs spironolactone sent to pharmacy.

## 2023-03-18 ENCOUNTER — Encounter: Payer: Self-pay | Admitting: *Deleted

## 2023-03-18 NOTE — Telephone Encounter (Signed)
Spoke to pt, informed her of recommendations. Pt voiced understanding. Pt states that swelling is not that bad this morning. Labs entered into Epic.

## 2023-03-24 DIAGNOSIS — K7460 Unspecified cirrhosis of liver: Secondary | ICD-10-CM | POA: Diagnosis not present

## 2023-03-25 LAB — BASIC METABOLIC PANEL
BUN/Creatinine Ratio: 23 (calc) — ABNORMAL HIGH (ref 6–22)
BUN: 24 mg/dL (ref 7–25)
CO2: 19 mmol/L — ABNORMAL LOW (ref 20–32)
Calcium: 8.4 mg/dL — ABNORMAL LOW (ref 8.6–10.4)
Chloride: 94 mmol/L — ABNORMAL LOW (ref 98–110)
Creat: 1.06 mg/dL — ABNORMAL HIGH (ref 0.60–1.00)
Glucose, Bld: 279 mg/dL — ABNORMAL HIGH (ref 65–139)
Potassium: 5.2 mmol/L (ref 3.5–5.3)
Sodium: 124 mmol/L — ABNORMAL LOW (ref 135–146)

## 2023-03-29 ENCOUNTER — Encounter (HOSPITAL_COMMUNITY): Payer: Self-pay

## 2023-03-29 NOTE — Therapy (Signed)
[facility] at [facility] [address] [place], [place], [address] Phone: [phone]   Fax:  [phone]  Patient Details  Name: Ebony Stevens MRN: [id number] Date of Birth: 1951-06-29 Referring Provider:  No ref. provider found  Encounter Date: 03/29/2023 PHYSICAL THERAPY DISCHARGE SUMMARY  Visits from Start of Care: 3  Current functional level related to goals / functional outcomes: Unknown   Remaining deficits: unknown   Education / Equipment: unknown   Patient agrees to discharge. Patient goals were not met. Patient is being discharged due to not returning since the last visit.   11:26 AM, 03/29/23 [name] MPT [facility] [place] [phone]  [facility] at [facility] [address] [place], [place], [address] Phone: [phone]   Fax:  [phone]

## 2023-03-30 ENCOUNTER — Other Ambulatory Visit: Payer: Self-pay | Admitting: *Deleted

## 2023-03-30 DIAGNOSIS — E875 Hyperkalemia: Secondary | ICD-10-CM

## 2023-04-06 DIAGNOSIS — I5032 Chronic diastolic (congestive) heart failure: Secondary | ICD-10-CM | POA: Diagnosis not present

## 2023-04-06 DIAGNOSIS — E1165 Type 2 diabetes mellitus with hyperglycemia: Secondary | ICD-10-CM | POA: Diagnosis not present

## 2023-04-06 DIAGNOSIS — I129 Hypertensive chronic kidney disease with stage 1 through stage 4 chronic kidney disease, or unspecified chronic kidney disease: Secondary | ICD-10-CM | POA: Diagnosis not present

## 2023-04-12 DIAGNOSIS — F32A Depression, unspecified: Secondary | ICD-10-CM | POA: Diagnosis not present

## 2023-04-12 DIAGNOSIS — I5032 Chronic diastolic (congestive) heart failure: Secondary | ICD-10-CM | POA: Diagnosis not present

## 2023-04-12 DIAGNOSIS — E1140 Type 2 diabetes mellitus with diabetic neuropathy, unspecified: Secondary | ICD-10-CM | POA: Diagnosis not present

## 2023-04-12 DIAGNOSIS — E1165 Type 2 diabetes mellitus with hyperglycemia: Secondary | ICD-10-CM | POA: Diagnosis not present

## 2023-04-12 DIAGNOSIS — Z0000 Encounter for general adult medical examination without abnormal findings: Secondary | ICD-10-CM | POA: Diagnosis not present

## 2023-04-12 DIAGNOSIS — N189 Chronic kidney disease, unspecified: Secondary | ICD-10-CM | POA: Diagnosis not present

## 2023-04-12 DIAGNOSIS — I129 Hypertensive chronic kidney disease with stage 1 through stage 4 chronic kidney disease, or unspecified chronic kidney disease: Secondary | ICD-10-CM | POA: Diagnosis not present

## 2023-04-12 DIAGNOSIS — I130 Hypertensive heart and chronic kidney disease with heart failure and stage 1 through stage 4 chronic kidney disease, or unspecified chronic kidney disease: Secondary | ICD-10-CM | POA: Diagnosis not present

## 2023-04-12 DIAGNOSIS — R800 Isolated proteinuria: Secondary | ICD-10-CM | POA: Diagnosis not present

## 2023-04-14 DIAGNOSIS — E875 Hyperkalemia: Secondary | ICD-10-CM | POA: Diagnosis not present

## 2023-04-15 LAB — BASIC METABOLIC PANEL
BUN/Creatinine Ratio: 18 (calc) (ref 6–22)
BUN: 19 mg/dL (ref 7–25)
CO2: 23 mmol/L (ref 20–32)
Calcium: 9.3 mg/dL (ref 8.6–10.4)
Chloride: 101 mmol/L (ref 98–110)
Creat: 1.05 mg/dL — ABNORMAL HIGH (ref 0.60–1.00)
Glucose, Bld: 107 mg/dL — ABNORMAL HIGH (ref 65–99)
Potassium: 4.7 mmol/L (ref 3.5–5.3)
Sodium: 133 mmol/L — ABNORMAL LOW (ref 135–146)

## 2023-04-21 DIAGNOSIS — Z1283 Encounter for screening for malignant neoplasm of skin: Secondary | ICD-10-CM | POA: Diagnosis not present

## 2023-04-21 DIAGNOSIS — L820 Inflamed seborrheic keratosis: Secondary | ICD-10-CM | POA: Diagnosis not present

## 2023-06-29 ENCOUNTER — Encounter: Payer: Self-pay | Admitting: Gastroenterology

## 2023-06-30 ENCOUNTER — Other Ambulatory Visit: Payer: Self-pay | Admitting: Gastroenterology

## 2023-06-30 DIAGNOSIS — K219 Gastro-esophageal reflux disease without esophagitis: Secondary | ICD-10-CM

## 2023-07-05 ENCOUNTER — Telehealth: Payer: Self-pay | Admitting: *Deleted

## 2023-07-05 ENCOUNTER — Other Ambulatory Visit: Payer: Self-pay | Admitting: Gastroenterology

## 2023-07-05 NOTE — Telephone Encounter (Signed)
Received refill request for Spironolactone 50mg . Pt last OV 02/04/2023

## 2023-07-06 DIAGNOSIS — C755 Malignant neoplasm of aortic body and other paraganglia: Secondary | ICD-10-CM | POA: Diagnosis not present

## 2023-07-06 DIAGNOSIS — D1800 Hemangioma unspecified site: Secondary | ICD-10-CM | POA: Diagnosis not present

## 2023-07-06 DIAGNOSIS — R93421 Abnormal radiologic findings on diagnostic imaging of right kidney: Secondary | ICD-10-CM | POA: Diagnosis not present

## 2023-07-06 DIAGNOSIS — Z85831 Personal history of malignant neoplasm of soft tissue: Secondary | ICD-10-CM | POA: Diagnosis not present

## 2023-07-07 NOTE — Telephone Encounter (Signed)
Noted  

## 2023-07-09 DIAGNOSIS — R1901 Right upper quadrant abdominal swelling, mass and lump: Secondary | ICD-10-CM | POA: Diagnosis not present

## 2023-07-09 DIAGNOSIS — C755 Malignant neoplasm of aortic body and other paraganglia: Secondary | ICD-10-CM | POA: Diagnosis not present

## 2023-07-09 DIAGNOSIS — N2889 Other specified disorders of kidney and ureter: Secondary | ICD-10-CM | POA: Diagnosis not present

## 2023-07-13 ENCOUNTER — Encounter: Payer: Self-pay | Admitting: Gastroenterology

## 2023-07-13 ENCOUNTER — Telehealth: Payer: Self-pay | Admitting: *Deleted

## 2023-07-13 ENCOUNTER — Ambulatory Visit: Payer: Medicare HMO | Admitting: Gastroenterology

## 2023-07-13 VITALS — BP 121/58 | HR 59 | Temp 97.5°F | Ht 61.5 in | Wt 265.2 lb

## 2023-07-13 DIAGNOSIS — K219 Gastro-esophageal reflux disease without esophagitis: Secondary | ICD-10-CM

## 2023-07-13 DIAGNOSIS — K7581 Nonalcoholic steatohepatitis (NASH): Secondary | ICD-10-CM

## 2023-07-13 DIAGNOSIS — K529 Noninfective gastroenteritis and colitis, unspecified: Secondary | ICD-10-CM

## 2023-07-13 DIAGNOSIS — K7460 Unspecified cirrhosis of liver: Secondary | ICD-10-CM

## 2023-07-13 NOTE — Telephone Encounter (Signed)
Spoke with pt and she is aware of appt details.  

## 2023-07-13 NOTE — Progress Notes (Signed)
GI Office Note    Referring Provider: Benita Stabile, MD Primary Care Physician:  Benita Stabile, MD Primary Gastroenterologist: Gerrit Friends.Rourk, MD  Date:  07/13/2023  ID:  Ebony Stevens, DOB 1951/03/06, MRN [id number]   Chief Complaint   Chief Complaint  Patient presents with   Follow-up    Follow up. No problems    History of Present Illness  Ebony Stevens is a 72 y.o. female with a history of liver lesion many years prior at [facility], cholecystectomy, chronic diarrhea, diabetes, HLD, HTN, asthma, Schatzki's ring presenting today for 56 months follow up.   CT in May 2019 with hepatic steatosis, hepatomegaly, and area of chronic volume loss in the left lobe of the liver with new calcification and pneumobilia likely due to evolution of remote insult related to cholecystectomy.  AFP normal in the past.  Chronic diarrhea previously managed with Questran and antidiarrheals.  Has had benign random colon biopsies and celiac screening.   Colonoscopy in 2020 with 1 hyperplastic polyp, benign random colon biopsies.  Due to repeat in 2025 due to family history of colon cancer.   Ultrasound September 2022 with fatty liver, superimposed inflammation or fibrosis not excluded, patent portal vein, evidence of cholecystectomy, 4 cm right renal angiomyolipoma.  Labs revealed a MELD score of 6.  She was found to be nonimmune to hepatitis A.  TSH stool studies negative.   EGD 08/21/2021: Not Schatzki's ring s/p dilation, small to hernia, no portal gastropathy, normal duodenum.  Recommend starting on Protonix 40 mg daily.   Abdominal ultrasound in April 2023 with fatty liver, no focal liver lesions present. Labs in April 2023 all within normal limits.  MELD Na: 6  Last office visit 02/04/23. Dysphagia slightly improved since stretching.  Continues to have intermittent lower abdominal pain and alternating constipation with diarrhea, leaning more toward diarrhea.  Using Imodium as needed.  Not able to do a lot  of physical therapy.  Reported a lot of nasal congestion Rehman allergies but no fevers or chills.  No overt abdominal pain but having back pain.  Having increased peripheral edema. Started on spironolactone to help with peripheral edema.  Continue Lasix.  RUQ [place] with elastography 02/23/23: -CBD measuring 4 mm -Increased hepatic echogenicity -kPa 4.1  In May patient reported ongoing swelling peripherally therefore spironolactone increased to 50 mg daily.  Creatinine stable at 1.05 as of July 2024 with sodium stable at 133, potassium 4.7   Today: Cirrhosis history Hematemesis/coffee ground emesis: none History of variceal bleeding: none Abdominal pain: none Abdominal distention/worsening ascites: none that she has noticed.  Fever/chills: none Episodes of confusion/disorientation: none Number of daily bowel movements: varies - see below Taking diuretics?: lasix 40 daily and aldactone Date of last EGD: November 2022 - no varices Prior history of banding?: none Prior episodes of SBP: none Last time liver imaging was performed:May 2024  MELD-Na: 7 at 02/04/2023  2:48 PM Calculated from: Serum Creatinine: 1.11 mg/dL at 01/18/8118  1:47 PM Serum Sodium: 138 mmol/L (Using max of 137 mmol/L) at 02/04/2023  2:48 PM Total Bilirubin: 0.5 mg/dL (Using min of 1 mg/dL) at 06/09/5620  3:08 PM INR(ratio): 1.0 at 02/04/2023  2:48 PM  GERD - Controlled other than with dietary indiscretion.   Diarrhea - States stools are different colors - sometimes darker and sometimes lighter. Sometimes little chunks and sometimes loose. Takes imodium 2-4 every other day or so. If she takes it everyday she gets lower abdominal pain. If she  doesn't take it then she has significant amounts of looser stools. Trying to stay eating her fiber as well. Sometimes stools atart normal and then get looser and looser throughout the day but some days she does not have a BM at all. That's is rare though and usually on occurs after  multiple doses of imodium.   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 She states she was told she had a growth on her kidney and recently had to go have a renal US done.   Recently had a couple falls. Larey Seat off ladder last week and this week had a fall down a short staircase. Still having a lot of back pain though. Sometimes walks with a cane.Stopped getting injections in her back.   Does not have much help at home.   Current Outpatient Medications  Medication Sig Dispense Refill   aspirin EC 325 MG tablet Take 325 mg by mouth daily.     atorvastatin (LIPITOR) 40 MG tablet Take 40 mg by mouth daily.     Calcium Carbonate-Vitamin D (CALTRATE 600+D PO) Take 1 tablet by mouth daily.     cetirizine (ZYRTEC) 10 MG tablet Take 10 mg by mouth daily.     estradiol (ESTRACE) 1 MG tablet Take 1 tablet by mouth at bedtime.     furosemide (LASIX) 40 MG tablet TAKE ONE TABLET BY MOUTH EVERY OTHER DAY (Patient taking differently: Take 40 mg by mouth daily.) 15 tablet 2   glipiZIDE (GLUCOTROL) 10 MG tablet Take 10 mg by mouth 2 (two) times daily before a meal.     insulin NPH-regular Human (70-30) 100 UNIT/ML injection Inject 60-70 Units into the skin at bedtime.     insulin regular (NOVOLIN R) 100 units/mL injection Inject 10 Units into the skin 3 (three) times daily after meals.     lisinopril (PRINIVIL,ZESTRIL) 40 MG tablet Take 40 mg by mouth daily.     loperamide (IMODIUM A-D) 2 MG tablet Take 2 mg by mouth as needed for diarrhea or loose stools.     metFORMIN (GLUCOPHAGE) 1000 MG tablet Take 1,000 mg by mouth 2 (two) times daily with a meal.     metoprolol succinate (TOPROL-XL) 100 MG 24 hr tablet Take 50 mg by mouth daily. Take with or immediately following a meal.     pantoprazole (PROTONIX) 40 MG tablet TAKE 1 TABLET EVERY DAY BEFORE BREAKFAST 90 tablet 3   spironolactone (ALDACTONE) 50 MG tablet Take 1 tablet (50 mg total) by mouth daily. 30 tablet 11   acetaminophen (TYLENOL)  500 MG tablet Take 500 mg by mouth every 6 (six) hours as needed for moderate pain.     albuterol (VENTOLIN HFA) 108 (90 Base) MCG/ACT inhaler Inhale 1-2 puffs into the lungs every 6 (six) hours as needed for wheezing or shortness of breath. (Patient not taking: Reported on 08/04/2022)     No current facility-administered medications for this visit.    Past Medical History:  Diagnosis Date   Asthma    Chest pain 07/11/2015   Diabetes mellitus without complication (HCC)    Dyspnea 07/11/2015   Early cirrhosis (HCC) 2020   Excessive bleeding in the premenopausal period    Fibromyalgia    GERD (gastroesophageal reflux disease)    HTN (hypertension)    Hypertension    Localized edema    Mixed hyperlipidemia    PONV (postoperative nausea and vomiting)    Sleep apnea    Type 2 diabetes  [facility]ellitus [facility])     Past Surgical History:  Procedure Laterality Date   ABDOMINAL HYSTERECTOMY     1975 partial hysterectomy,  1978 "complete hysterctomy and retack bladder". states it didn't take.    BIOPSY  11/10/2018   Procedure: BIOPSY;  Surgeon: Corbin Ade, MD;  Location: [facility];  Service: Endoscopy;;  colon    CHOLECYSTECTOMY     COLONOSCOPY WITH PROPOFOL N/A 11/10/2018   Surgeon: Corbin Ade, MD;  1 hyperplastic polyp, random colon biopsies benign, due for repeat in 2025 due to family history of colon cancer.   DILATION AND CURETTAGE OF UTERUS     ESOPHAGOGASTRODUODENOSCOPY (EGD) WITH PROPOFOL N/A 08/21/2021   Surgeon: Corbin Ade, MD; Mild Schatzki's ring dilated, small hiatal hernia, no portal gastropathy, normal examined duodenum.   MALONEY DILATION  08/21/2021   Procedure: MALONEY DILATION;  Surgeon: Corbin Ade, MD;  Location: [facility];  Service: Endoscopy;;   POLYPECTOMY  11/10/2018   Procedure: POLYPECTOMY;  Surgeon: Corbin Ade, MD;  Location: [facility];  Service: Endoscopy;;  colon    TONSILLECTOMY      Family History  Problem Relation Age of  Onset   Cancer Sister        ovarian   Diabetes Brother    Colon cancer Brother 4       Passed from [facility] after 1 year    Allergies as of 07/13/2023   (No Known Allergies)    Social History   Socioeconomic History   Marital status: Married    Spouse name: Not on file   Number of children: Not on file   Years of education: Not on file   Highest education level: Not on file  Occupational History   Not on file  Tobacco Use   Smoking status: Never   Smokeless tobacco: Never  Vaping Use   Vaping status: Never Used  Substance and Sexual Activity   Alcohol use: No   Drug use: No   Sexual activity: Not Currently  Other Topics Concern   Not on file  Social History Narrative   ** Merged History Encounter **       Social Determinants of Health   Financial Resource Strain: Not on file  Food Insecurity: Not on file  Transportation Needs: Not on file  Physical Activity: Not on file  Stress: Not on file  Social Connections: Not on file     Review of Systems   Gen: + Weight gain.  Denies fever, chills, anorexia. Denies fatigue, weakness, weight loss.  CV: Denies chest pain, palpitations, syncope, peripheral edema, and claudication. Resp: Denies dyspnea at rest, cough, wheezing, coughing up blood, and pleurisy. GI: See HPI Derm: Denies rash, itching, dry skin Psych: Denies depression, anxiety, memory loss, confusion. No homicidal or suicidal ideation.  Heme: Denies bruising, bleeding, and enlarged lymph nodes. + Back pain   Physical Exam   BP (!) 121/58 (BP Location: Right Arm, Patient Position: Sitting, Cuff Size: Large)   Pulse (!) 59   Temp (!) 97.5 F (36.4 C) (Temporal)   Ht 5' 1.5" (1.562 m)   Wt 265 lb 3.2 oz (120.3 kg)   SpO2 93%   BMI 49.30 kg/m   General:   Alert and oriented. No distress noted. Pleasant and cooperative.  Head:  Normocephalic and atraumatic. Eyes:  Conjuctiva clear without scleral icterus. Mouth:  Oral mucosa pink and moist. Good  dentition. No lesions.  Erythema noted to bilateral cheek folds  from CPAP mask. Lungs:  Clear to auscultation bilaterally. No wheezes, rales, or rhonchi. No distress.  Heart:  S1, S2 present without murmurs appreciated.  Abdomen:  +BS, soft, non-distended.  Mild TTP to right upper quadrant and right lower quadrant.  No rebound or guarding.  Unable to assess for HSM. Rectal: deferred Msk:  Symmetrical without gross deformities. Normal posture. Extremities:  Without edema. Neurologic:  Alert and  oriented x4 Psych:  Alert and cooperative. Normal mood and affect.   Assessment  Ebony Stevens is a 72 y.o. female with a history of iver lesion many years prior at [facility], cholecystectomy, chronic diarrhea, diabetes, HLD, HTN, asthma, Schatzki's ring presenting today for 56 months follow up.   MASLD/MASH cirrhosis: Elastography in 2020 with F3 plus F4 fibrosis.  Previous MELD score 7 as of labs from April.  EGD in November 2022 negative for varices or portal hypertensive gastropathy.  Vaccinate against hepatitis A and hepatitis B.  Continues to struggle with weight loss.  Discussed need to continue with 2 g sodium diet.  Most recent labs stable and she has tolerated Lasix 40 mg daily and Aldactone 50 mg daily, currently without any peripheral edema.  Need for hepatoma screening after 11/14, previously without any evidence.  MELD labs need to be updated within the next couple of weeks.  Will also check AFP and [employer].  Currently without significant signs of portal hypertension.  Will recommend repeat EGD if she develops signs of portal hypertension or thrombocytopenia.  GERD, dysphagia: History of Droxy's ring requiring dilation November 2022.  No current reports of dysphagia.  GERD well-controlled with pantoprazole 40 mg once daily.  Chronic Diarrhea: History of chronic intermittent diarrhea with lower abdominal pain, primarily on the right side.  Typically taking Imodium every other day or so.   Diarrhea likely secondary to medications as well as postcholecystectomy state.  For now we will continue Imodium as needed  PLAN    CBC, CMP, INR, AFP, NASH fibrosure within the next couple weeks RUQ [place] 11/14 or after 2 g sodium diet Imodium as needed for diarrhea Continue pantoprazole 40 mg daily Continue Lasix 40 mg daily and Aldactone 50 mg daily Follow up in 6 months     Brooke Bonito, MSN, FNP-BC, AGACNP-BC [facility]

## 2023-07-13 NOTE — Patient Instructions (Addendum)
Continue Lasix and spironolactone as prescribed.  Cirrhosis Lifestyle Recommendations:  High-protein diet from a primarily plant-based diet. Avoid red meat.  No raw or undercooked meat, seafood, or shellfish. Low-fat/cholesterol/carbohydrate diet. Limit sodium to no more than 2000 mg/day including everything that you eat and drink. Recommend at least 30 minutes of aerobic and resistance exercise 3 days/week. Limit Tylenol to 2000 mg daily.   We will plan to repeat labs in a few weeks and get your ultrasound planning for middle November to stay on track with every 6 months.  We can mail your lab slips per your request.  Continue using Imodium as needed for your diarrhea.  We will plan to follow-up in 6 months, sooner if needed.  Look for foam adhesive pads at [facility] or something similar to moleskin padding.  This could help you prevent the redness on your face from your CPAP mask.  It was a pleasure to see you today. I want to create trusting relationships with patients. If you receive a survey regarding your visit,  I greatly appreciate you taking time to fill this out on paper or through your MyChart. I value your feedback.  Brooke Bonito, MSN, FNP-BC, AGACNP-BC [facility]

## 2023-07-13 NOTE — Telephone Encounter (Signed)
LMOVM to call back to give [place] appt details for 11/19, arrival 915am, npo midnight

## 2023-07-14 DIAGNOSIS — I129 Hypertensive chronic kidney disease with stage 1 through stage 4 chronic kidney disease, or unspecified chronic kidney disease: Secondary | ICD-10-CM | POA: Diagnosis not present

## 2023-07-14 DIAGNOSIS — E1165 Type 2 diabetes mellitus with hyperglycemia: Secondary | ICD-10-CM | POA: Diagnosis not present

## 2023-07-15 ENCOUNTER — Other Ambulatory Visit: Payer: Self-pay

## 2023-07-15 ENCOUNTER — Emergency Department (HOSPITAL_COMMUNITY): Payer: Medicare HMO

## 2023-07-15 ENCOUNTER — Inpatient Hospital Stay (HOSPITAL_COMMUNITY)
Admission: EM | Admit: 2023-07-15 | Discharge: 2023-07-19 | DRG: 640 | Disposition: A | Discharge status: Home / Self Care | Payer: Medicare HMO | Source: Ambulatory Visit | Attending: Internal Medicine | Admitting: Internal Medicine

## 2023-07-15 ENCOUNTER — Encounter (HOSPITAL_COMMUNITY): Payer: Self-pay

## 2023-07-15 DIAGNOSIS — Z7984 Long term (current) use of oral hypoglycemic drugs: Secondary | ICD-10-CM | POA: Diagnosis not present

## 2023-07-15 DIAGNOSIS — I10 Essential (primary) hypertension: Secondary | ICD-10-CM | POA: Diagnosis not present

## 2023-07-15 DIAGNOSIS — Z79890 Hormone replacement therapy (postmenopausal): Secondary | ICD-10-CM

## 2023-07-15 DIAGNOSIS — K219 Gastro-esophageal reflux disease without esophagitis: Secondary | ICD-10-CM | POA: Diagnosis not present

## 2023-07-15 DIAGNOSIS — G4733 Obstructive sleep apnea (adult) (pediatric): Secondary | ICD-10-CM | POA: Diagnosis not present

## 2023-07-15 DIAGNOSIS — E871 Hypo-osmolality and hyponatremia: Secondary | ICD-10-CM | POA: Diagnosis not present

## 2023-07-15 DIAGNOSIS — Z833 Family history of diabetes mellitus: Secondary | ICD-10-CM

## 2023-07-15 DIAGNOSIS — Z90711 Acquired absence of uterus with remaining cervical stump: Secondary | ICD-10-CM

## 2023-07-15 DIAGNOSIS — K7460 Unspecified cirrhosis of liver: Secondary | ICD-10-CM | POA: Diagnosis present

## 2023-07-15 DIAGNOSIS — Z794 Long term (current) use of insulin: Secondary | ICD-10-CM | POA: Diagnosis not present

## 2023-07-15 DIAGNOSIS — D649 Anemia, unspecified: Secondary | ICD-10-CM | POA: Diagnosis present

## 2023-07-15 DIAGNOSIS — Z9181 History of falling: Secondary | ICD-10-CM

## 2023-07-15 DIAGNOSIS — M797 Fibromyalgia: Secondary | ICD-10-CM | POA: Diagnosis present

## 2023-07-15 DIAGNOSIS — E66813 Obesity, class 3: Secondary | ICD-10-CM | POA: Diagnosis not present

## 2023-07-15 DIAGNOSIS — E782 Mixed hyperlipidemia: Secondary | ICD-10-CM | POA: Diagnosis not present

## 2023-07-15 DIAGNOSIS — R0989 Other specified symptoms and signs involving the circulatory and respiratory systems: Secondary | ICD-10-CM | POA: Diagnosis not present

## 2023-07-15 DIAGNOSIS — K7581 Nonalcoholic steatohepatitis (NASH): Secondary | ICD-10-CM | POA: Diagnosis present

## 2023-07-15 DIAGNOSIS — W19XXXA Unspecified fall, initial encounter: Secondary | ICD-10-CM

## 2023-07-15 DIAGNOSIS — T500X5A Adverse effect of mineralocorticoids and their antagonists, initial encounter: Secondary | ICD-10-CM | POA: Diagnosis present

## 2023-07-15 DIAGNOSIS — Z7982 Long term (current) use of aspirin: Secondary | ICD-10-CM

## 2023-07-15 DIAGNOSIS — Z79899 Other long term (current) drug therapy: Secondary | ICD-10-CM | POA: Diagnosis not present

## 2023-07-15 DIAGNOSIS — Y92009 Unspecified place in unspecified non-institutional (private) residence as the place of occurrence of the external cause: Secondary | ICD-10-CM | POA: Diagnosis not present

## 2023-07-15 DIAGNOSIS — Z9049 Acquired absence of other specified parts of digestive tract: Secondary | ICD-10-CM

## 2023-07-15 DIAGNOSIS — E119 Type 2 diabetes mellitus without complications: Secondary | ICD-10-CM | POA: Diagnosis present

## 2023-07-15 DIAGNOSIS — I5033 Acute on chronic diastolic (congestive) heart failure: Secondary | ICD-10-CM | POA: Diagnosis present

## 2023-07-15 DIAGNOSIS — R001 Bradycardia, unspecified: Secondary | ICD-10-CM | POA: Diagnosis not present

## 2023-07-15 DIAGNOSIS — Z23 Encounter for immunization: Secondary | ICD-10-CM | POA: Diagnosis not present

## 2023-07-15 DIAGNOSIS — R531 Weakness: Secondary | ICD-10-CM | POA: Diagnosis not present

## 2023-07-15 DIAGNOSIS — I110 Hypertensive heart disease with heart failure: Secondary | ICD-10-CM | POA: Diagnosis not present

## 2023-07-15 DIAGNOSIS — Z6841 Body Mass Index (BMI) 40.0 and over, adult: Secondary | ICD-10-CM | POA: Diagnosis not present

## 2023-07-15 DIAGNOSIS — I517 Cardiomegaly: Secondary | ICD-10-CM | POA: Diagnosis not present

## 2023-07-15 DIAGNOSIS — I5031 Acute diastolic (congestive) heart failure: Secondary | ICD-10-CM | POA: Diagnosis not present

## 2023-07-15 LAB — BASIC METABOLIC PANEL
Anion gap: 8 (ref 5–15)
BUN: 15 mg/dL (ref 8–23)
CO2: 22 mmol/L (ref 22–32)
Calcium: 8.7 mg/dL — ABNORMAL LOW (ref 8.9–10.3)
Chloride: 87 mmol/L — ABNORMAL LOW (ref 98–111)
Creatinine, Ser: 1.27 mg/dL — ABNORMAL HIGH (ref 0.44–1.00)
GFR, Estimated: 45 mL/min — ABNORMAL LOW (ref >60)
Glucose, Bld: 105 mg/dL — ABNORMAL HIGH (ref 70–99)
Potassium: 4.8 mmol/L (ref 3.5–5.1)
Sodium: 117 mmol/L — CL (ref 135–145)

## 2023-07-15 LAB — URINALYSIS, ROUTINE W REFLEX MICROSCOPIC
Bilirubin Urine: NEGATIVE
Glucose, UA: NEGATIVE mg/dL
Hgb urine dipstick: NEGATIVE
Ketones, ur: NEGATIVE mg/dL
Leukocytes,Ua: NEGATIVE
Nitrite: NEGATIVE
Protein, ur: NEGATIVE mg/dL
Specific Gravity, Urine: 1.004 — ABNORMAL LOW (ref 1.005–1.030)
pH: 5 (ref 5.0–8.0)

## 2023-07-15 LAB — CBC
HCT: 32.7 % — ABNORMAL LOW (ref 36.0–46.0)
Hemoglobin: 11.5 g/dL — ABNORMAL LOW (ref 12.0–15.0)
MCH: 30.3 pg (ref 26.0–34.0)
MCHC: 35.2 g/dL (ref 30.0–36.0)
MCV: 86.3 fL (ref 80.0–100.0)
Platelets: 164 10*3/uL (ref 150–400)
RBC: 3.79 MIL/uL — ABNORMAL LOW (ref 3.87–5.11)
RDW: 14.8 % (ref 11.5–15.5)
WBC: 4.4 10*3/uL (ref 4.0–10.5)
nRBC: 0.5 % — ABNORMAL HIGH (ref 0.0–0.2)

## 2023-07-15 LAB — LIPID PANEL
Cholesterol: 78 mg/dL (ref 0–200)
HDL: 51 mg/dL (ref >40)
LDL Cholesterol: 13 mg/dL (ref 0–99)
Total CHOL/HDL Ratio: 1.5 {ratio}
Triglycerides: 70 mg/dL (ref <150)
VLDL: 14 mg/dL (ref 0–40)

## 2023-07-15 LAB — CBG MONITORING, ED: Glucose-Capillary: 93 mg/dL (ref 70–99)

## 2023-07-15 LAB — SODIUM, URINE, RANDOM: Sodium, Ur: 38 mmol/L

## 2023-07-15 LAB — AMMONIA: Ammonia: <10 umol/L (ref 9–35)

## 2023-07-15 MED ORDER — SODIUM BICARBONATE 650 MG PO TABS: 650.0000 mg | ORAL_TABLET | Freq: Every day | ORAL | Status: DC

## 2023-07-15 MED ORDER — INSULIN GLARGINE-YFGN 100 UNIT/ML ~~LOC~~ SOLN
10.0000 [IU] | Freq: Every day | SUBCUTANEOUS | Status: DC
  Administered 2023-07-16 – 2023-07-18 (×3): 10 [IU] via SUBCUTANEOUS
  Filled 2023-07-15 (×4): qty 0.1

## 2023-07-15 MED ORDER — PNEUMOCOCCAL 20-VAL CONJ VACC 0.5 ML IM SUSY: 0.5000 mL | PREFILLED_SYRINGE | INTRAMUSCULAR | Status: DC

## 2023-07-15 MED ORDER — ONDANSETRON HCL 4 MG/2ML IJ SOLN: 4.0000 mg | Freq: Four times a day (QID) | INTRAMUSCULAR | Status: DC | PRN

## 2023-07-15 MED ORDER — SODIUM CHLORIDE 0.9 % IV SOLN
Freq: Once | INTRAVENOUS | Status: AC
Start: 2023-07-15 — End: 2023-07-15

## 2023-07-15 MED ORDER — INSULIN ASPART 100 UNIT/ML IJ SOLN
0.0000 [IU] | Freq: Three times a day (TID) | INTRAMUSCULAR | Status: DC
  Administered 2023-07-16: 3 [IU] via SUBCUTANEOUS
  Administered 2023-07-17 (×2): 2 [IU] via SUBCUTANEOUS
  Administered 2023-07-18: 3 [IU] via SUBCUTANEOUS
  Administered 2023-07-18 – 2023-07-19 (×3): 2 [IU] via SUBCUTANEOUS
  Filled 2023-07-15: qty 1

## 2023-07-15 MED ORDER — ONDANSETRON HCL 4 MG PO TABS: 4.0000 mg | ORAL_TABLET | Freq: Four times a day (QID) | ORAL | Status: DC | PRN

## 2023-07-15 MED ORDER — FUROSEMIDE 10 MG/ML IJ SOLN
40.0000 mg | Freq: Once | INTRAMUSCULAR | Status: AC
  Administered 2023-07-15: 40 mg via INTRAVENOUS
  Filled 2023-07-15: qty 4

## 2023-07-15 MED ORDER — FUROSEMIDE 10 MG/ML IJ SOLN
40.0000 mg | Freq: Two times a day (BID) | INTRAMUSCULAR | Status: DC
  Administered 2023-07-16: 40 mg via INTRAVENOUS
  Filled 2023-07-15: qty 4

## 2023-07-15 MED ORDER — ENOXAPARIN SODIUM 40 MG/0.4ML IJ SOSY
40.0000 mg | PREFILLED_SYRINGE | INTRAMUSCULAR | Status: DC
  Administered 2023-07-16 – 2023-07-17 (×2): 40 mg via SUBCUTANEOUS
  Filled 2023-07-15 (×2): qty 0.4

## 2023-07-15 MED ORDER — INFLUENZA VAC A&B SURF ANT ADJ 0.5 ML IM SUSY: 0.5000 mL | PREFILLED_SYRINGE | INTRAMUSCULAR | Status: DC

## 2023-07-15 NOTE — H&P (Addendum)
History and Physical    Patient: Ebony Stevens BOF:[id number] DOB: May 21, 1951 DOA: 07/15/2023 DOS: the patient was seen and examined on 07/15/2023 PCP: Benita Stabile, MD  Patient coming from: Home  Chief Complaint:  Chief Complaint  Patient presents with   low sodium   HPI: Ebony Stevens is a 72 y.o. female with medical history significant of cirrhosis, GERD, type 2 diabetes mellitus, asthma, hypertension, hyperlipidemia, sleep apnea who presents to the emergency department due to being asked to go to the ED by her PCP.  Patient states that she had a recent visit to her physician yesterday, blood work was done and she received a call from the physician today to go to the ED due to low level of sodium.  Patient states that she was on Lasix and spironolactone and the spironolactone was recently increased to 50 mg by GI 2 days ago due to fluid retention.  She endorsed 2 falls in less than 2 weeks ago, but denies any injury during the fall.  She ambulates occasionally with a cane.  ED Course:  In the emergency department, she was tachypneic, BP was 170/71, other vital signs are within normal range.  Workup in the ED normal Citic anemia, BMP showed sodium of 117, potassium 4.8, chloride 87, bicarb 22, blood glucose 105, BUN 15, creatinine 1.27 (baseline creatinine 0.9-1.1).  Urinalysis was normal, ammonia level was less than 10, lipid panel was normal. Chest x-ray showed findings most consistent with CHF or mild interstitial edema IV Lasix 40 mg x 1 was given, IV NS 1 L was provided. Hospitalist was asked to admit patient for further evaluation and management.  Review of Systems: Review of systems as noted in the HPI. All other systems reviewed and are negative.   Past Medical History:  Diagnosis Date   Asthma    Chest pain 07/11/2015   Diabetes mellitus without complication (HCC)    Dyspnea 07/11/2015   Early cirrhosis (HCC) 2020   Excessive bleeding in the premenopausal period     Fibromyalgia    GERD (gastroesophageal reflux disease)    HTN (hypertension)    Hypertension    Localized edema    Mixed hyperlipidemia    PONV (postoperative nausea and vomiting)    Sleep apnea    Type 2 diabetes mellitus (HCC)    Past Surgical History:  Procedure Laterality Date   ABDOMINAL HYSTERECTOMY     1975 partial hysterectomy,  1978 "complete hysterctomy and retack bladder". states it didn't take.    BIOPSY  11/10/2018   Procedure: BIOPSY;  Surgeon: Corbin Ade, MD;  Location: [facility];  Service: Endoscopy;;  colon    CHOLECYSTECTOMY     COLONOSCOPY WITH PROPOFOL N/A 11/10/2018   Surgeon: Corbin Ade, MD;  1 hyperplastic polyp, random colon biopsies benign, due for repeat in 2025 due to family history of colon cancer.   DILATION AND CURETTAGE OF UTERUS     ESOPHAGOGASTRODUODENOSCOPY (EGD) WITH PROPOFOL N/A 08/21/2021   Surgeon: Corbin Ade, MD; Mild Schatzki's ring dilated, small hiatal hernia, no portal gastropathy, normal examined duodenum.   MALONEY DILATION  08/21/2021   Procedure: MALONEY DILATION;  Surgeon: Corbin Ade, MD;  Location: [facility];  Service: Endoscopy;;   POLYPECTOMY  11/10/2018   Procedure: POLYPECTOMY;  Surgeon: Corbin Ade, MD;  Location: [facility];  Service: Endoscopy;;  colon    TONSILLECTOMY      Social History:  reports that she has never  smoked. She has never used smokeless tobacco. She reports that she does not drink alcohol and does not use drugs.   No Known Allergies  Family History  Problem Relation Age of Onset   Cancer Sister        ovarian   Diabetes Brother    Colon cancer Brother 70       Passed from [facility] after 1 year     Prior to Admission medications   Medication Sig Start Date End Date Taking? Authorizing Provider  acetaminophen (TYLENOL) 500 MG tablet Take 500 mg by mouth every 6 (six) hours as needed for moderate pain.   Yes [provider]  albuterol (VENTOLIN HFA) 108 (90  Base) MCG/ACT inhaler Inhale 1-2 puffs into the lungs every 6 (six) hours as needed for wheezing or shortness of breath.   Yes [provider]  aspirin EC 325 MG tablet Take 325 mg by mouth daily.   Yes [provider]  atorvastatin (LIPITOR) 40 MG tablet Take 40 mg by mouth daily. 09/20/20  Yes [provider]  Calcium Carbonate-Vitamin D (CALTRATE 600+D PO) Take 1 tablet by mouth daily.   Yes [provider]  cetirizine (ZYRTEC) 10 MG tablet Take 10 mg by mouth daily.   Yes [provider]  estradiol (ESTRACE) 1 MG tablet Take 1 tablet by mouth at bedtime. 03/27/19  Yes [provider]  furosemide (LASIX) 40 MG tablet TAKE ONE TABLET BY MOUTH EVERY OTHER DAY Patient taking differently: Take 40 mg by mouth daily. 05/28/17  Yes Pricilla Riffle, MD  glipiZIDE (GLUCOTROL) 10 MG tablet Take 10 mg by mouth daily before breakfast.   Yes [provider]  lisinopril (PRINIVIL,ZESTRIL) 40 MG tablet Take 40 mg by mouth daily. 12/01/18  Yes [provider]  loperamide (IMODIUM A-D) 2 MG tablet Take 2 mg by mouth as needed for diarrhea or loose stools.   Yes [provider]  metFORMIN (GLUCOPHAGE-XR) 500 MG 24 hr tablet Take 1,000 mg by mouth 2 (two) times daily.   Yes [provider]  metoprolol succinate (TOPROL-XL) 100 MG 24 hr tablet Take 50 mg by mouth daily. Take with or immediately following a meal.   Yes [provider]  MOUNJARO 5 MG/0.5ML Pen Inject 5 mg into the skin once a week. 05/26/23  Yes [provider]  pantoprazole (PROTONIX) 40 MG tablet TAKE 1 TABLET EVERY DAY BEFORE BREAKFAST 06/30/23  Yes Mahon, Courtney L, NP  spironolactone (ALDACTONE) 50 MG tablet Take 1 tablet (50 mg total) by mouth daily. 03/17/23 03/16/24 Yes Mahon, Frederik Schmidt, NP  insulin NPH-regular Human (70-30) 100 UNIT/ML injection Inject 60-70 Units into the skin at bedtime. Patient not taking: Reported on 07/15/2023    [provider]  insulin regular (NOVOLIN R) 100 units/mL injection Inject 10 Units into the skin 3 (three) times daily after meals. Patient not taking: Reported on 07/15/2023    [provider]    Physical Exam: BP (!) 128/39 (BP Location: Right Arm)   Pulse (!) 45   Temp 98 F (36.7 C) (Oral)   Resp 13   Wt 117.9 kg   SpO2 96%   BMI 48.33 kg/m   General: 72 y.o. year-old female well developed well nourished in no acute distress.  Alert and oriented x3. HEENT: NCAT, EOMI Neck: Supple, trachea medial Cardiovascular: Bradycardia.  Regular rate and rhythm with no rubs or gallops.  No thyromegaly or JVD noted.  Positive lower extremity edema.  2/4 pulses in all 4 extremities. Respiratory: Diffuse Rales and rhonchi on auscultation. Abdomen: Soft, nontender nondistended with normal bowel sounds x4 quadrants. Muskuloskeletal: No cyanosis, clubbing noted bilaterally Neuro: CN II-XII intact, strength 5/5 x 4, sensation, reflexes intact Skin: No ulcerative lesions noted or rashes Psychiatry: Judgement and insight appear normal. Mood is appropriate for condition and setting          Labs on Admission:  Basic Metabolic Panel: Recent Labs  Lab 07/15/23 1606  NA 117*  K 4.8  CL 87*  CO2 22  GLUCOSE 105*  BUN 15  CREATININE 1.27*  CALCIUM 8.7*   Liver Function Tests: No results for input(s): "AST", "ALT", "ALKPHOS", "BILITOT", "PROT", "ALBUMIN" in the last 168 hours. No results for input(s): "LIPASE", "AMYLASE" in the last 168 hours. Recent Labs  Lab 07/15/23 2023  AMMONIA <10   CBC: Recent Labs  Lab 07/15/23 1606  WBC 4.4  HGB 11.5*  HCT 32.7*  MCV 86.3  PLT 164   Cardiac Enzymes: No results for input(s): "CKTOTAL", "CKMB", "CKMBINDEX", "TROPONINI" in the last 168 hours.  BNP (last 3 results) No results for input(s): "BNP" in the last 8760 hours.  ProBNP (last 3 results) No results for input(s): "PROBNP" in the last 8760 hours.  CBG: Recent Labs  Lab  07/15/23 1944  GLUCAP 93    Radiological Exams on Admission: DG Chest Portable 1 View  Result Date: 07/15/2023 CLINICAL DATA:  Hyponatremia, weakness EXAM: PORTABLE CHEST 1 VIEW COMPARISON:  CTA chest 12/17/2007 FINDINGS: Cardiomegaly. Pulmonary vascular congestion. Mild interstitial thickening. No focal consolidation, pleural effusion, or pneumothorax. IMPRESSION: Findings most consistent with CHF and mild interstitial edema. Electronically Signed   By: Minerva Fester M.D.   On: 07/15/2023 19:54    EKG: I independently viewed the EKG done and my findings are as followed: Junctional rhythm with HR of 44 bpm  Assessment/Plan Present on Admission:  Hyponatremia  Bradycardia  Benign essential HTN  Mixed hyperlipidemia  GERD without esophagitis  Principal Problem:   Hyponatremia Active Problems:   GERD without esophagitis   Benign essential HTN   Diabetes mellitus without complication (HCC)   Mixed hyperlipidemia   Bradycardia   Acute on chronic diastolic CHF (congestive heart failure) (HCC)   Fall at home, initial encounter   Obstructive sleep apnea   Obesity, Class III, BMI 40-49.9 (morbid obesity) (HCC)  Hyponatremia Sodium 117, this may be due to fluid overload and diuretics (patient on Lasix and spironolactone) 1 L IV NS was initially provided IV Lasix 40 mg x 1 was given in the ED Continue to monitor sodium with serial BMPs Urine osmolality, serum osmolality and urine sodium will be checked Monitor sodium levels and consider sodium tablets as needed  Acute on chronic diastolic CHF Continue total input/output, daily weights and fluid restriction She was treated with IV Lasix 40 mg x 1 ; continue IV Lasix 40 mg twice daily (monitor for worsening low-sodium) Continue heart healthy/modified carb diet  Echocardiogram done in September 2017 showed LVEF of 60 to 65%.  No RWMA.  G2 DD.  Echocardiogram will be done in the morning   Fall at home Continue fall  precaution Continue PT/OT eval and treat  Bradycardia This may be due to medication (metoprolol) effect Consider dose reduction prior to discharge Continue telemetry Consider cardiology consult for worsening of symptoms  Essential hypertension Continue Mucinex, lisinopril  Mixed hyperlipidemia Continue Lipitor  GERD Continue Protonix  Obstructive sleep apnea Continue CPAP  Type 2 diabetes  mellitus Continue Semglee 10 units nightly and adjust dose accordingly Continue ISS and hypoglycemia protocol  Hepatic cirrhosis due to NASH Continue IV Lasix, spironolactone temporarily held due to ongoing hyponatremia Patient follows with [facility] gastroenterology  Morbid obesity (BMI 48.33) Patient was initially on Mounjaro, but this was stopped due to insurance issues Patient will need to follow-up with outpatient PCP for weight loss program   DVT prophylaxis: Lovenox  Advance Care Planning: CODE STATUS: Full code  Consults: None  Family Communication: None at bedside  Severity of Illness: The appropriate patient status for this patient is INPATIENT. Inpatient status is judged to be reasonable and necessary in order to provide the required intensity of service to ensure the patient's safety. The patient's presenting symptoms, physical exam findings, and initial radiographic and laboratory data in the context of their chronic comorbidities is felt to place them at high risk for further clinical deterioration. Furthermore, it is not anticipated that the patient will be medically stable for discharge from the hospital within 2 midnights of admission.   * I certify that at the point of admission it is my clinical judgment that the patient will require inpatient hospital care spanning beyond 2 midnights from the point of admission due to high intensity of service, high risk for further deterioration and high frequency of surveillance required.*  Author: Frankey Shown,  DO 07/15/2023 11:40 PM  For on call review [email or web address].

## 2023-07-15 NOTE — ED Provider Notes (Signed)
[place] EMERGENCY DEPARTMENT AT [facility] Provider Note   CSN: [id number] Arrival date & time: 07/15/23  1537     History  Chief Complaint  Patient presents with   low sodium    Ebony Stevens is a 72 y.o. female.  Pt is a 72 yo female with pmhx significant for dm2, hld, htn, asthma, cirrhosis, and hx of cutaneous glomangiosarcoma (treated).  Pt said she had a routine visit to her doctor yesterday and was told her sodium was low.  Pt does take lasix and spironolactone, but has been on those meds for months.  GI did increase her spiro to 50 mg daily on 10/1 because she's had more fluid retention.  Pt feels weak and said she's fallen twice.  She did not injure herself during that fall.         Home Medications Prior to Admission medications   Medication Sig Start Date End Date Taking? Authorizing Provider  potassium chloride (KLOR-CON) 10 MEQ tablet Take 10 mEq by mouth daily. 03/29/23  Yes [provider]  acetaminophen (TYLENOL) 500 MG tablet Take 500 mg by mouth every 6 (six) hours as needed for moderate pain.    [provider]  albuterol (VENTOLIN HFA) 108 (90 Base) MCG/ACT inhaler Inhale 1-2 puffs into the lungs every 6 (six) hours as needed for wheezing or shortness of breath.    [provider]  aspirin EC 325 MG tablet Take 325 mg by mouth daily.    [provider]  atorvastatin (LIPITOR) 40 MG tablet Take 40 mg by mouth daily. 09/20/20   [provider]  Calcium Carbonate-Vitamin D (CALTRATE 600+D PO) Take 1 tablet by mouth daily.    [provider]  cetirizine (ZYRTEC) 10 MG tablet Take 10 mg by mouth daily.    [provider]  estradiol (ESTRACE) 1 MG tablet Take 1 tablet by mouth at bedtime. 03/27/19   [provider]  famotidine (PEPCID) 10 MG tablet Take 10 mg by mouth daily.    [provider]  furosemide (LASIX) 40 MG tablet TAKE ONE TABLET BY MOUTH EVERY OTHER DAY Patient  taking differently: Take 40 mg by mouth daily. 05/28/17   Pricilla Riffle, MD  glipiZIDE (GLUCOTROL) 10 MG tablet Take 10 mg by mouth 2 (two) times daily before a meal.    [provider]  insulin NPH-regular Human (70-30) 100 UNIT/ML injection Inject 60-70 Units into the skin at bedtime.    [provider]  insulin regular (NOVOLIN R) 100 units/mL injection Inject 10 Units into the skin 3 (three) times daily after meals.    [provider]  lisinopril (PRINIVIL,ZESTRIL) 40 MG tablet Take 40 mg by mouth daily. 12/01/18   [provider]  loperamide (IMODIUM A-D) 2 MG tablet Take 2 mg by mouth as needed for diarrhea or loose stools.    [provider]  metFORMIN (GLUCOPHAGE) 1000 MG tablet Take 1,000 mg by mouth 2 (two) times daily with a meal.    [provider]  metFORMIN (GLUCOPHAGE-XR) 500 MG 24 hr tablet Take 1,000 mg by mouth 2 (two) times daily.    [provider]  metoprolol succinate (TOPROL-XL) 100 MG 24 hr tablet Take 50 mg by mouth daily. Take with or immediately following a meal.    [provider]  MOUNJARO 5 MG/0.5ML Pen Inject 5 mg into the skin once a week. 05/26/23   [provider]  ondansetron (ZOFRAN-ODT) 4 MG disintegrating  tablet Take 4 mg by mouth every 8 (eight) hours as needed for vomiting or nausea. 12/09/21   [provider]  oxyCODONE (OXY IR/ROXICODONE) 5 MG immediate release tablet Take 5 mg by mouth every 6 (six) hours as needed for severe pain. 12/09/21   [provider]  pantoprazole (PROTONIX) 40 MG tablet TAKE 1 TABLET EVERY DAY BEFORE BREAKFAST 06/30/23   Aida Raider, NP  spironolactone (ALDACTONE) 50 MG tablet Take 1 tablet (50 mg total) by mouth daily. 03/17/23 03/16/24  Aida Raider, NP      Allergies    Patient has no known allergies.    Review of Systems   Review of Systems  Cardiovascular:  Positive for leg swelling.  Neurological:  Positive for weakness.   All other systems reviewed and are negative.   Physical Exam Updated Vital Signs BP (!) 140/38 (BP Location: Right Arm)   Pulse (!) 44   Temp 98 F (36.7 C) (Oral)   Resp 14   Wt 117.9 kg   SpO2 99%   BMI 48.33 kg/m  Physical Exam Vitals and nursing note reviewed.  Constitutional:      Appearance: Normal appearance. She is obese.  HENT:     Head: Normocephalic and atraumatic.     Right Ear: External ear normal.     Left Ear: External ear normal.     Nose: Nose normal.     Mouth/Throat:     Mouth: Mucous membranes are moist.     Pharynx: Oropharynx is clear.  Eyes:     Extraocular Movements: Extraocular movements intact.     Conjunctiva/sclera: Conjunctivae normal.     Pupils: Pupils are equal, round, and reactive to light.  Cardiovascular:     Rate and Rhythm: Regular rhythm. Bradycardia present.     Pulses: Normal pulses.     Heart sounds: Normal heart sounds.  Pulmonary:     Effort: Pulmonary effort is normal.     Breath sounds: Normal breath sounds.  Abdominal:     General: Abdomen is flat. Bowel sounds are normal.     Palpations: Abdomen is soft.  Musculoskeletal:     Cervical back: Normal range of motion and neck supple.     Right lower leg: Edema present.     Left lower leg: Edema present.  Skin:    General: Skin is warm.     Capillary Refill: Capillary refill takes less than 2 seconds.  Neurological:     General: No focal deficit present.     Mental Status: She is alert and oriented to person, place, and time.  Psychiatric:        Mood and Affect: Mood normal.        Behavior: Behavior normal.        Thought Content: Thought content normal.        Judgment: Judgment normal.     ED Results / Procedures / Treatments   Labs (all labs ordered are listed, but only abnormal results are displayed) Labs Reviewed  BASIC METABOLIC PANEL - Abnormal; Notable for the following components:      Result Value   Sodium 117 (*)    Chloride 87 (*)    Glucose,  Bld 105 (*)    Creatinine, Ser 1.27 (*)    Calcium 8.7 (*)    GFR, Estimated 45 (*)    All other components within normal limits  CBC - Abnormal; Notable for the following components:   RBC 3.79 (*)  Hemoglobin 11.5 (*)    HCT 32.7 (*)    nRBC 0.5 (*)    All other components within normal limits  URINALYSIS, ROUTINE W REFLEX MICROSCOPIC - Abnormal; Notable for the following components:   Color, Urine STRAW (*)    Specific Gravity, Urine 1.004 (*)    All other components within normal limits  AMMONIA  LIPID PANEL  OSMOLALITY  SODIUM, URINE, RANDOM  CBG MONITORING, ED    EKG EKG Interpretation Date/Time:  Thursday July 15 2023 18:31:16 EDT Ventricular Rate:  44 PR Interval:    QRS Duration:  107 QT Interval:  460 QTC Calculation: 394 R Axis:   77  Text Interpretation: Junctional rhythm Since last tracing rate slower Confirmed by Jacalyn Lefevre [phone]) on 07/15/2023 7:29:27 PM  Radiology DG Chest Portable 1 View  Result Date: 07/15/2023 CLINICAL DATA:  Hyponatremia, weakness EXAM: PORTABLE CHEST 1 VIEW COMPARISON:  CTA chest 12/17/2007 FINDINGS: Cardiomegaly. Pulmonary vascular congestion. Mild interstitial thickening. No focal consolidation, pleural effusion, or pneumothorax. IMPRESSION: Findings most consistent with CHF and mild interstitial edema. Electronically Signed   By: Minerva Fester M.D.   On: 07/15/2023 19:54    Procedures Procedures    Medications Ordered in ED Medications  furosemide (LASIX) injection 40 mg (has no administration in time range)  0.9 %  sodium chloride infusion (0 mLs Intravenous Stopped 07/15/23 2032)    ED Course/ Medical Decision Making/ A&P                                 Medical Decision Making Amount and/or Complexity of Data Reviewed Labs: ordered. Radiology: ordered.  Risk Prescription drug management. Decision regarding hospitalization.   This patient presents to the ED for concern of low sodium, this involves an  extensive number of treatment options, and is a complaint that carries with it a high risk of complications and morbidity.  The differential diagnosis includes pseudohyponatremia, hypotonic, renal failure, hyperosmolar   Co morbidities that complicate the patient evaluation   dm2, hld, htn, asthma, cirrhosis, and hx of cutaneous glomangiosarcoma   Additional history obtained:  Additional history obtained from epic chart review  Lab Tests:  I Ordered, and personally interpreted labs.  The pertinent results include:  ua neg, cbc nl other than hgb sl low at 11.5 (hgb 13.7 on 4/25), na low at 117 (133 in July), cr sl elevated at 1.27 (cr 1.05 3 mo ago)   Imaging Studies ordered:  I ordered imaging studies including cxr  I independently visualized and interpreted imaging which showed Findings most consistent with CHF and mild interstitial edema.  I agree with the radiologist interpretation   Cardiac Monitoring:  The patient was maintained on a cardiac monitor.  I personally viewed and interpreted the cardiac monitored which showed an underlying rhythm of: sb   Medicines ordered and prescription drug management:  I ordered medication including lasix  for hyponatremia  Reevaluation of the patient after these medicines showed that the patient improved I have reviewed the patients home medicines and have made adjustments as needed   Critical Interventions:  lasix   Consultations Obtained:  I requested consultation with the hospitalist (Dr. Thomes Dinning),  and discussed lab and imaging findings as well as pertinent plan -he will admit   Problem List / ED Course:  Hyponatremia:  likely dilutional as she is fluid overloaded Anemia:  likely dilutional CHF:  new onset.  No echo since 2017.  Reevaluation:  After the interventions noted above, I reevaluated the patient and found that they have :improved   Social Determinants of Health:  Lives at home   Dispostion:  After  consideration of the diagnostic results and the patients response to treatment, I feel that the patent would benefit from admission.          Final Clinical Impression(s) / ED Diagnoses Final diagnoses:  Hyponatremia  Bradycardia    Rx / DC Orders ED Discharge Orders     None         Jacalyn Lefevre, MD 07/15/23 2049

## 2023-07-15 NOTE — ED Triage Notes (Addendum)
Referred by MD for hyponatremia after routine lab work yesterday. C/o weakness x2 weeks and recently been doing a new diet decreasing sodium intake Pt is unsure of NA level.  Pt reports chronic diarrhea.  Denies n/v

## 2023-07-16 ENCOUNTER — Inpatient Hospital Stay (HOSPITAL_COMMUNITY): Payer: Medicare HMO

## 2023-07-16 ENCOUNTER — Other Ambulatory Visit (HOSPITAL_COMMUNITY): Payer: Self-pay | Admitting: *Deleted

## 2023-07-16 DIAGNOSIS — I5031 Acute diastolic (congestive) heart failure: Secondary | ICD-10-CM | POA: Diagnosis not present

## 2023-07-16 DIAGNOSIS — E871 Hypo-osmolality and hyponatremia: Secondary | ICD-10-CM | POA: Diagnosis not present

## 2023-07-16 LAB — COMPREHENSIVE METABOLIC PANEL
ALT: 27 U/L (ref 0–44)
AST: 34 U/L (ref 15–41)
Albumin: 4 g/dL (ref 3.5–5.0)
Alkaline Phosphatase: 66 U/L (ref 38–126)
Anion gap: 10 (ref 5–15)
BUN: 16 mg/dL (ref 8–23)
CO2: 22 mmol/L (ref 22–32)
Calcium: 8.9 mg/dL (ref 8.9–10.3)
Chloride: 87 mmol/L — ABNORMAL LOW (ref 98–111)
Creatinine, Ser: 1.27 mg/dL — ABNORMAL HIGH (ref 0.44–1.00)
GFR, Estimated: 45 mL/min — ABNORMAL LOW (ref >60)
Glucose, Bld: 100 mg/dL — ABNORMAL HIGH (ref 70–99)
Potassium: 4.6 mmol/L (ref 3.5–5.1)
Sodium: 119 mmol/L — CL (ref 135–145)
Total Bilirubin: 0.9 mg/dL (ref 0.3–1.2)
Total Protein: 6.2 g/dL — ABNORMAL LOW (ref 6.5–8.1)

## 2023-07-16 LAB — HEMOGLOBIN A1C
Hgb A1c MFr Bld: 5.9 % — ABNORMAL HIGH (ref 4.8–5.6)
Mean Plasma Glucose: 122.63 mg/dL

## 2023-07-16 LAB — CBC
HCT: 34.6 % — ABNORMAL LOW (ref 36.0–46.0)
Hemoglobin: 11.9 g/dL — ABNORMAL LOW (ref 12.0–15.0)
MCH: 29.6 pg (ref 26.0–34.0)
MCHC: 34.4 g/dL (ref 30.0–36.0)
MCV: 86.1 fL (ref 80.0–100.0)
Platelets: 184 10*3/uL (ref 150–400)
RBC: 4.02 MIL/uL (ref 3.87–5.11)
RDW: 14.6 % (ref 11.5–15.5)
WBC: 6.4 10*3/uL (ref 4.0–10.5)
nRBC: 0 % (ref 0.0–0.2)

## 2023-07-16 LAB — BASIC METABOLIC PANEL
Anion gap: 8 (ref 5–15)
Anion gap: 10 (ref 5–15)
BUN: 17 mg/dL (ref 8–23)
BUN: 17 mg/dL (ref 8–23)
CO2: 22 mmol/L (ref 22–32)
CO2: 22 mmol/L (ref 22–32)
Calcium: 8.8 mg/dL — ABNORMAL LOW (ref 8.9–10.3)
Calcium: 9 mg/dL (ref 8.9–10.3)
Chloride: 90 mmol/L — ABNORMAL LOW (ref 98–111)
Chloride: 86 mmol/L — ABNORMAL LOW (ref 98–111)
Creatinine, Ser: 1.27 mg/dL — ABNORMAL HIGH (ref 0.44–1.00)
Creatinine, Ser: 1.26 mg/dL — ABNORMAL HIGH (ref 0.44–1.00)
GFR, Estimated: 45 mL/min — ABNORMAL LOW (ref >60)
GFR, Estimated: 46 mL/min — ABNORMAL LOW (ref >60)
Glucose, Bld: 119 mg/dL — ABNORMAL HIGH (ref 70–99)
Glucose, Bld: 140 mg/dL — ABNORMAL HIGH (ref 70–99)
Potassium: 4.5 mmol/L (ref 3.5–5.1)
Potassium: 4.7 mmol/L (ref 3.5–5.1)
Sodium: 120 mmol/L — ABNORMAL LOW (ref 135–145)
Sodium: 118 mmol/L — CL (ref 135–145)

## 2023-07-16 LAB — ECHOCARDIOGRAM COMPLETE
AR max vel: 2.17 cm2
AV Area VTI: 1.85 cm2
AV Area mean vel: 2.02 cm2
AV Mean grad: 8 mm[Hg]
AV Peak grad: 16.5 mm[Hg]
Ao pk vel: 2.03 m/s
Area-P 1/2: 2.13 cm2
S' Lateral: 2.5 cm
Weight: 4160 [oz_av]

## 2023-07-16 LAB — CBG MONITORING, ED
Glucose-Capillary: 119 mg/dL — ABNORMAL HIGH (ref 70–99)
Glucose-Capillary: 190 mg/dL — ABNORMAL HIGH (ref 70–99)

## 2023-07-16 LAB — MAGNESIUM: Magnesium: 1.5 mg/dL — ABNORMAL LOW (ref 1.7–2.4)

## 2023-07-16 LAB — OSMOLALITY: Osmolality: 263 mosm/kg — ABNORMAL LOW (ref 275–295)

## 2023-07-16 LAB — GLUCOSE, CAPILLARY
Glucose-Capillary: 120 mg/dL — ABNORMAL HIGH (ref 70–99)
Glucose-Capillary: 173 mg/dL — ABNORMAL HIGH (ref 70–99)

## 2023-07-16 LAB — OSMOLALITY, URINE: Osmolality, Ur: 185 mosm/kg — ABNORMAL LOW (ref 300–900)

## 2023-07-16 LAB — SODIUM: Sodium: 121 mmol/L — ABNORMAL LOW (ref 135–145)

## 2023-07-16 LAB — TSH: TSH: 3.202 u[IU]/mL (ref 0.350–4.500)

## 2023-07-16 LAB — HIV ANTIBODY (ROUTINE TESTING W REFLEX): HIV Screen 4th Generation wRfx: NONREACTIVE

## 2023-07-16 LAB — PHOSPHORUS: Phosphorus: 4.2 mg/dL (ref 2.5–4.6)

## 2023-07-16 MED ORDER — PERFLUTREN LIPID MICROSPHERE
1.0000 mL | INTRAVENOUS | Status: AC | PRN
  Administered 2023-07-16: 3 mL via INTRAVENOUS

## 2023-07-16 MED ORDER — TOLVAPTAN 15 MG PO TABS
15.0000 mg | ORAL_TABLET | Freq: Once | ORAL | Status: AC
  Administered 2023-07-16: 15 mg via ORAL

## 2023-07-16 MED ORDER — ALBUTEROL SULFATE HFA 108 (90 BASE) MCG/ACT IN AERS: 1.0000 | INHALATION_SPRAY | Freq: Four times a day (QID) | RESPIRATORY_TRACT | Status: DC | PRN

## 2023-07-16 MED ORDER — TOLVAPTAN 15 MG PO TABS
15.0000 mg | ORAL_TABLET | Freq: Once | ORAL | Status: DC
  Filled 2023-07-16: qty 1

## 2023-07-16 MED ORDER — ATORVASTATIN CALCIUM 40 MG PO TABS
40.0000 mg | ORAL_TABLET | Freq: Every day | ORAL | Status: DC
  Administered 2023-07-16 – 2023-07-19 (×4): 40 mg via ORAL
  Filled 2023-07-16 (×4): qty 1

## 2023-07-16 MED ORDER — MAGNESIUM SULFATE 2 GM/50ML IV SOLN
2.0000 g | Freq: Once | INTRAVENOUS | Status: AC
  Administered 2023-07-16: 2 g via INTRAVENOUS
  Filled 2023-07-16: qty 50

## 2023-07-16 MED ORDER — ACETAMINOPHEN 325 MG PO TABS
650.0000 mg | ORAL_TABLET | Freq: Four times a day (QID) | ORAL | Status: DC | PRN
  Administered 2023-07-16 – 2023-07-17 (×2): 650 mg via ORAL
  Filled 2023-07-16 (×2): qty 2

## 2023-07-16 MED ORDER — CHLORHEXIDINE GLUCONATE CLOTH 2 % EX PADS
6.0000 | MEDICATED_PAD | Freq: Every day | CUTANEOUS | Status: DC
  Administered 2023-07-16 – 2023-07-18 (×3): 6 via TOPICAL

## 2023-07-16 MED ORDER — LISINOPRIL 10 MG PO TABS
40.0000 mg | ORAL_TABLET | Freq: Every day | ORAL | Status: DC
  Administered 2023-07-16: 40 mg via ORAL
  Filled 2023-07-16: qty 4

## 2023-07-16 MED ORDER — PANTOPRAZOLE SODIUM 40 MG PO TBEC
40.0000 mg | DELAYED_RELEASE_TABLET | Freq: Every day | ORAL | Status: DC
  Administered 2023-07-16 – 2023-07-19 (×4): 40 mg via ORAL
  Filled 2023-07-16 (×4): qty 1

## 2023-07-16 NOTE — Plan of Care (Signed)

## 2023-07-16 NOTE — ED Notes (Signed)
ED TO INPATIENT HANDOFF REPORT  ED Nurse Name and Phone #: Jacques Earthly Name/Age/Gender Ebony Stevens 72 y.o. female Room/Bed: APA19/APA19  Code Status   Code Status: Full Code  Home/SNF/Other Home Patient oriented to: self, place, time, and situation Is this baseline? Yes   Triage Complete: Triage complete  Chief Complaint Hyponatremia [E87.1]  Triage Note Referred by MD for hyponatremia after routine lab work yesterday. C/o weakness x2 weeks and recently been doing a new diet decreasing sodium intake Pt is unsure of NA level.  Pt reports chronic diarrhea.  Denies n/v   Allergies No Known Allergies  Level of Care/Admitting Diagnosis ED Disposition     ED Disposition  Admit   Condition  --   Comment  Hospital Area: [facility] [[id number]]  Level of Care: Telemetry [5]  Covid Evaluation: Asymptomatic - no recent exposure (last 10 days) testing not required  Diagnosis: Hyponatremia [198519]  Admitting Physician: Frankey Shown [[id number]]  Attending Physician: Frankey Shown [[id number]]  Certification:: I certify this patient will need inpatient services for at least 2 midnights  Expected Medical Readiness: 07/18/2023          B Medical/Surgery History Past Medical History:  Diagnosis Date   Asthma    Chest pain 07/11/2015   Diabetes mellitus without complication (HCC)    Dyspnea 07/11/2015   Early cirrhosis (HCC) 2020   Excessive bleeding in the premenopausal period    Fibromyalgia    GERD (gastroesophageal reflux disease)    HTN (hypertension)    Hypertension    Localized edema    Mixed hyperlipidemia    PONV (postoperative nausea and vomiting)    Sleep apnea    Type 2 diabetes mellitus (HCC)    Past Surgical History:  Procedure Laterality Date   ABDOMINAL HYSTERECTOMY     1975 partial hysterectomy,  1978 "complete hysterctomy and retack bladder". states it didn't take.    BIOPSY  11/10/2018   Procedure: BIOPSY;  Surgeon: Corbin Ade, MD;  [facility];  Service: Endoscopy;;  colon    CHOLECYSTECTOMY     COLONOSCOPY WITH PROPOFOL N/A 11/10/2018   Surgeon: Corbin Ade, MD;  1 hyperplastic polyp, random colon biopsies benign, due for repeat in 2025 due to family history of colon cancer.   DILATION AND CURETTAGE OF UTERUS     ESOPHAGOGASTRODUODENOSCOPY (EGD) WITH PROPOFOL N/A 08/21/2021   Surgeon: Corbin Ade, MD; Mild Schatzki's ring dilated, small hiatal hernia, no portal gastropathy, normal examined duodenum.   MALONEY DILATION  08/21/2021   Procedure: MALONEY DILATION;  Surgeon: Corbin Ade, MD;  [facility];  Service: Endoscopy;;   POLYPECTOMY  11/10/2018   Procedure: POLYPECTOMY;  Surgeon: Corbin Ade, MD;  Location[facility];  Service: Endoscopy;;  colon    TONSILLECTOMY       A IV Location/Drains/Wounds Patient Lines/Drains/Airways Status     Active Line/Drains/Airways     Name Placement date Placement time Site Days   Peripheral IV 07/15/23 22 G 1" Left;Anterior Forearm 07/15/23  1835  Forearm  1            Intake/Output Last 24 hours  Intake/Output Summary (Last 24 hours) at 07/16/2023 0310 Last data filed at 07/16/2023 0304 Gross per 24 hour  Intake --  Output 1300 ml  Net -1300 ml    Labs/Imaging Results for orders placed or performed during the hospital encounter of 07/15/23 (from the past 48 hour(s))  Basic  metabolic panel     Status: Abnormal   Collection Time: 07/15/23  4:06 PM  Result Value Ref Range   Sodium 117 (LL) 135 - 145 mmol/L    Comment: CRITICAL RESULT CALLED TO, READ BACK BY AND VERIFIED WITH BELCHER,J ON 07/15/23 AT 1635 BY LOY,C   Potassium 4.8 3.5 - 5.1 mmol/L   Chloride 87 (L) 98 - 111 mmol/L   CO2 22 22 - 32 mmol/L   Glucose, Bld 105 (H) 70 - 99 mg/dL    Comment: Glucose reference range applies only to samples taken after fasting for at least 8 hours.   BUN 15 8 - 23 mg/dL   Creatinine, Ser 8.29 (H) 0.44 - 1.00  mg/dL   Calcium 8.7 (L) 8.9 - 10.3 mg/dL   GFR, Estimated 45 (L) >60 mL/min    Comment: (NOTE) Calculated using the CKD-EPI Creatinine Equation (2021)    Anion gap 8 5 - 15    Comment: Performed at [facility], [address], [place], [place] [address]  CBC     Status: Abnormal   Collection Time: 07/15/23  4:06 PM  Result Value Ref Range   WBC 4.4 4.0 - 10.5 K/uL   RBC 3.79 (L) 3.87 - 5.11 MIL/uL   Hemoglobin 11.5 (L) 12.0 - 15.0 g/dL   HCT 08.6 (L) 57.8 - 46.9 %   MCV 86.3 80.0 - 100.0 fL   MCH 30.3 26.0 - 34.0 pg   MCHC 35.2 30.0 - 36.0 g/dL   RDW 62.9 52.8 - 41.3 %   Platelets 164 150 - 400 K/uL   nRBC 0.5 (H) 0.0 - 0.2 %    Comment: Performed at [facility], [address] [place], [place] [address]  CBG monitoring, ED     Status: None   Collection Time: 07/15/23  7:44 PM  Result Value Ref Range   Glucose-Capillary 93 70 - 99 mg/dL    Comment: Glucose reference range applies only to samples taken after fasting for at least 8 hours.  Urinalysis, Routine w reflex microscopic -Urine, Clean Catch     Status: Abnormal   Collection Time: 07/15/23  7:56 PM  Result Value Ref Range   Color, Urine STRAW (A) YELLOW   APPearance CLEAR CLEAR   Specific Gravity, Urine 1.004 (L) 1.005 - 1.030   pH 5.0 5.0 - 8.0   Glucose, UA NEGATIVE NEGATIVE mg/dL   Hgb urine dipstick NEGATIVE NEGATIVE   Bilirubin Urine NEGATIVE NEGATIVE   Ketones, ur NEGATIVE NEGATIVE mg/dL   Protein, ur NEGATIVE NEGATIVE mg/dL   Nitrite NEGATIVE NEGATIVE   Leukocytes,Ua NEGATIVE NEGATIVE    Comment: Performed at [facility], [address], [place], [place] [address]  Sodium, urine, random     Status: None   Collection Time: 07/15/23  7:56 PM  Result Value Ref Range   Sodium, Ur 38 mmol/L    Comment: Performed at [facility], [address], [place], [place] [address]  Ammonia     Status: None   Collection Time: 07/15/23  8:23 PM  Result Value Ref Range   Ammonia <10 9 - 35 umol/L    Comment: Performed at  [facility], [address] [place], [place] [address]  Lipid panel     Status: None   Collection Time: 07/15/23  8:24 PM  Result Value Ref Range   Cholesterol 78 0 - 200 mg/dL   Triglycerides 70 <259 mg/dL   HDL 51 >56 mg/dL   Total CHOL/HDL Ratio 1.5  RATIO   VLDL 14 0 - 40 mg/dL   LDL Cholesterol 13 0 - 99 mg/dL    Comment:        Total Cholesterol/HDL:CHD Risk Coronary Heart Disease Risk Table                     Men   Women  1/2 Average Risk   3.4   3.3  Average Risk       5.0   4.4  2 X Average Risk   9.6   7.1  3 X Average Risk  23.4   11.0        Use the calculated Patient Ratio above and the CHD Risk Table to determine the patient's CHD Risk.        ATP III CLASSIFICATION (LDL):  <100     mg/dL   Optimal  607-371  mg/dL   Near or Above                    Optimal  130-159  mg/dL   Borderline  062-694  mg/dL   High  >854     mg/dL   Very High Performed at [facility], [address], [place], [place] [address]    DG Chest Portable 1 View  Result Date: 07/15/2023 CLINICAL DATA:  Hyponatremia, weakness EXAM: PORTABLE CHEST 1 VIEW COMPARISON:  CTA chest 12/17/2007 FINDINGS: Cardiomegaly. Pulmonary vascular congestion. Mild interstitial thickening. No focal consolidation, pleural effusion, or pneumothorax. IMPRESSION: Findings most consistent with CHF and mild interstitial edema. Electronically Signed   By: Minerva Fester M.D.   On: 07/15/2023 19:54    Pending Labs Unresulted Labs (From admission, onward)     Start     Ordered   07/22/23 0500  Creatinine, serum  (enoxaparin (LOVENOX)    CrCl >/= 30 ml/min)  Weekly,   R     Comments: while on enoxaparin therapy    07/15/23 2227   07/16/23 0900  Basic metabolic panel  Now then every 6 hours,   R (with TIMED occurrences)      07/15/23 2209   07/16/23 0500  Comprehensive metabolic panel  Tomorrow morning,   R        07/15/23 2227   07/16/23 0500  CBC  Tomorrow morning,   R        07/15/23 2227   07/16/23 0500   Magnesium  Tomorrow morning,   R        07/15/23 2227   07/16/23 0500  Phosphorus  Tomorrow morning,   R        07/15/23 2227   07/15/23 2237  Hemoglobin A1c  Once,   R       Comments: To assess prior glycemic control    07/15/23 2238   07/15/23 2225  HIV Antibody (routine testing w rflx)  (HIV Antibody (Routine testing w reflex) panel)  Once,   R        07/15/23 2227   07/15/23 2208  Osmolality, urine  Once,   R        07/15/23 2208   07/15/23 2022  Osmolality  Once,   URGENT        07/15/23 2021            Vitals/Pain Today's Vitals   07/16/23 0000 07/16/23 0100 07/16/23 0200 07/16/23 0300  BP: (!) 140/38 (!) 147/48 (!) 142/49 (!) 172/41  Pulse: (!) 45 (!) 46 (!) 42 (!) 48  Resp:  16 14 16 19   Temp:    98 F (36.7 C)  TempSrc:    Oral  SpO2: 96% 100% 99% 98%  Weight:      PainSc:        Isolation Precautions No active isolations  Medications Medications  pneumococcal 20-valent conjugate vaccine (PREVNAR 20) injection 0.5 mL (has no administration in time range)  influenza vaccine adjuvanted (FLUAD) injection 0.5 mL (has no administration in time range)  furosemide (LASIX) injection 40 mg (has no administration in time range)  enoxaparin (LOVENOX) injection 40 mg (has no administration in time range)  ondansetron (ZOFRAN) tablet 4 mg (has no administration in time range)    Or  ondansetron (ZOFRAN) injection 4 mg (has no administration in time range)  atorvastatin (LIPITOR) tablet 40 mg (has no administration in time range)  lisinopril (ZESTRIL) tablet 40 mg (has no administration in time range)  pantoprazole (PROTONIX) EC tablet 40 mg (has no administration in time range)  albuterol (VENTOLIN HFA) 108 (90 Base) MCG/ACT inhaler 1-2 puff (has no administration in time range)  insulin glargine-yfgn (SEMGLEE) injection 10 Units (has no administration in time range)  insulin aspart (novoLOG) injection 0-15 Units (has no administration in time range)  0.9 %  sodium  chloride infusion (0 mLs Intravenous Stopped 07/15/23 2032)  furosemide (LASIX) injection 40 mg (40 mg Intravenous Given 07/15/23 2049)    Mobility walks     Focused Assessments    R Recommendations: See Admitting Provider Note  Report given to:   Additional Notes: A&O; Ambu with cane; 22G LFA

## 2023-07-16 NOTE — ED Notes (Signed)
ED TO INPATIENT HANDOFF REPORT  ED Nurse Name and Phone #: Marcello Moores [phone]  S Name/Age/Gender Ebony Stevens 72 y.o. female Room/Bed: APA19/APA19  Code Status   Code Status: Full Code  Home/SNF/Other Home Patient oriented to: self, place, time, and situation Is this baseline? Yes   Triage Complete: Triage complete  Chief Complaint Hyponatremia [E87.1]  Triage Note Referred by MD for hyponatremia after routine lab work yesterday. C/o weakness x2 weeks and recently been doing a new diet decreasing sodium intake Pt is unsure of NA level.  Pt reports chronic diarrhea.  Denies n/v   Allergies No Known Allergies  Level of Care/Admitting Diagnosis ED Disposition     ED Disposition  Admit   Condition  --   Comment  Hospital Area: [facility] [[id number]]  Level of Care: Stepdown [14]  Covid Evaluation: Asymptomatic - no recent exposure (last 10 days) testing not required  Diagnosis: Hyponatremia [198519]  Admitting Physician: Frankey Shown [[id number]]  Attending Physician: Frankey Shown [[id number]]  Certification:: I certify this patient will need inpatient services for at least 2 midnights          B Medical/Surgery History Past Medical History:  Diagnosis Date   Asthma    Chest pain 07/11/2015   Diabetes mellitus without complication (HCC)    Dyspnea 07/11/2015   Early cirrhosis (HCC) 2020   Excessive bleeding in the premenopausal period    Fibromyalgia    GERD (gastroesophageal reflux disease)    HTN (hypertension)    Hypertension    Localized edema    Mixed hyperlipidemia    PONV (postoperative nausea and vomiting)    Sleep apnea    Type 2 diabetes mellitus (HCC)    Past Surgical History:  Procedure Laterality Date   ABDOMINAL HYSTERECTOMY     1975 partial hysterectomy,  1978 "complete hysterctomy and retack bladder". states it didn't take.    BIOPSY  11/10/2018   Procedure: BIOPSY;  Surgeon: Corbin Ade, MD;  [facility];   Service: Endoscopy;;  colon    CHOLECYSTECTOMY     COLONOSCOPY WITH PROPOFOL N/A 11/10/2018   Surgeon: Corbin Ade, MD;  1 hyperplastic polyp, random colon biopsies benign, due for repeat in 2025 due to family history of colon cancer.   DILATION AND CURETTAGE OF UTERUS     ESOPHAGOGASTRODUODENOSCOPY (EGD) WITH PROPOFOL N/A 08/21/2021   Surgeon: Corbin Ade, MD; Mild Schatzki's ring dilated, small hiatal hernia, no portal gastropathy, normal examined duodenum.   MALONEY DILATION  08/21/2021   Procedure: MALONEY DILATION;  Surgeon: Corbin Ade, MD;  [facility];  Service: Endoscopy;;   POLYPECTOMY  11/10/2018   Procedure: POLYPECTOMY;  Surgeon: Corbin Ade, MD;  [facility];  Service: Endoscopy;;  colon    TONSILLECTOMY       A IV Location/Drains/Wounds Patient Lines/Drains/Airways Status     Active Line/Drains/Airways     Name Placement date Placement time Site Days   Peripheral IV 07/15/23 22 G 1" Left;Anterior Forearm 07/15/23  1835  Forearm  1            Intake/Output Last 24 hours  Intake/Output Summary (Last 24 hours) at 07/16/2023 1359 Last data filed at 07/16/2023 1143 Gross per 24 hour  Intake --  Output 2450 ml  Net -2450 ml    Labs/Imaging Results for orders placed or performed during the hospital encounter of 07/15/23 (from the past 48 hour(s))  Basic metabolic panel  Status: Abnormal   Collection Time: 07/15/23  4:06 PM  Result Value Ref Range   Sodium 117 (LL) 135 - 145 mmol/L    Comment: CRITICAL RESULT CALLED TO, READ BACK BY AND VERIFIED WITH BELCHER,J ON 07/15/23 AT 1635 BY LOY,C   Potassium 4.8 3.5 - 5.1 mmol/L   Chloride 87 (L) 98 - 111 mmol/L   CO2 22 22 - 32 mmol/L   Glucose, Bld 105 (H) 70 - 99 mg/dL    Comment: Glucose reference range applies only to samples taken after fasting for at least 8 hours.   BUN 15 8 - 23 mg/dL   Creatinine, Ser 7.25 (H) 0.44 - 1.00 mg/dL   Calcium 8.7 (L) 8.9 - 10.3 mg/dL    GFR, Estimated 45 (L) >60 mL/min    Comment: (NOTE) Calculated using the CKD-EPI Creatinine Equation (2021)    Anion gap 8 5 - 15    Comment: Performed at [facility], [address], [place], [place] [address]  CBC     Status: Abnormal   Collection Time: 07/15/23  4:06 PM  Result Value Ref Range   WBC 4.4 4.0 - 10.5 K/uL   RBC 3.79 (L) 3.87 - 5.11 MIL/uL   Hemoglobin 11.5 (L) 12.0 - 15.0 g/dL   HCT 03.4 (L) 74.2 - 59.5 %   MCV 86.3 80.0 - 100.0 fL   MCH 30.3 26.0 - 34.0 pg   MCHC 35.2 30.0 - 36.0 g/dL   RDW 63.8 75.6 - 43.3 %   Platelets 164 150 - 400 K/uL   nRBC 0.5 (H) 0.0 - 0.2 %    Comment: Performed at [facility], [address], [place], [place] [address]  Hemoglobin A1c     Status: Abnormal   Collection Time: 07/15/23  4:06 PM  Result Value Ref Range   Hgb A1c MFr Bld 5.9 (H) 4.8 - 5.6 %    Comment: (NOTE) Pre diabetes:          5.7%-6.4%  Diabetes:              >6.4%  Glycemic control for   <7.0% adults with diabetes    Mean Plasma Glucose 122.63 mg/dL    Comment: Performed at [facility], [address], [place], [place] [address]  CBG monitoring, ED     Status: None   Collection Time: 07/15/23  7:44 PM  Result Value Ref Range   Glucose-Capillary 93 70 - 99 mg/dL    Comment: Glucose reference range applies only to samples taken after fasting for at least 8 hours.  Urinalysis, Routine w reflex microscopic -Urine, Clean Catch     Status: Abnormal   Collection Time: 07/15/23  7:56 PM  Result Value Ref Range   Color, Urine STRAW (A) YELLOW   APPearance CLEAR CLEAR   Specific Gravity, Urine 1.004 (L) 1.005 - 1.030   pH 5.0 5.0 - 8.0   Glucose, UA NEGATIVE NEGATIVE mg/dL   Hgb urine dipstick NEGATIVE NEGATIVE   Bilirubin Urine NEGATIVE NEGATIVE   Ketones, ur NEGATIVE NEGATIVE mg/dL   Protein, ur NEGATIVE NEGATIVE mg/dL   Nitrite NEGATIVE NEGATIVE   Leukocytes,Ua NEGATIVE NEGATIVE    Comment: Performed at [facility], [address]  [place], [place] [address]  Sodium, urine, random     Status: None   Collection Time: 07/15/23  7:56 PM  Result Value Ref Range   Sodium, Ur 38 mmol/L    Comment: Performed at [facility], [address] [place], [place] [address]  Osmolality, urine     Status: Abnormal   Collection Time: 07/15/23  7:56 PM  Result Value Ref Range   Osmolality, Ur 185 (L) 300 - 900 mOsm/kg    Comment: Performed at [facility], [address] [place], [place] [address]  Ammonia     Status: None   Collection Time: 07/15/23  8:23 PM  Result Value Ref Range   Ammonia <10 9 - 35 umol/L    Comment: Performed at [facility], [address], [place], [place] [address]  Lipid panel     Status: None   Collection Time: 07/15/23  8:24 PM  Result Value Ref Range   Cholesterol 78 0 - 200 mg/dL   Triglycerides 70 <469 mg/dL   HDL 51 >62 mg/dL   Total CHOL/HDL Ratio 1.5 RATIO   VLDL 14 0 - 40 mg/dL   LDL Cholesterol 13 0 - 99 mg/dL    Comment:        Total Cholesterol/HDL:CHD Risk Coronary Heart Disease Risk Table                     Men   Women  1/2 Average Risk   3.4   3.3  Average Risk       5.0   4.4  2 X Average Risk   9.6   7.1  3 X Average Risk  23.4   11.0        Use the calculated Patient Ratio above and the CHD Risk Table to determine the patient's CHD Risk.        ATP III CLASSIFICATION (LDL):  <100     mg/dL   Optimal  952-841  mg/dL   Near or Above                    Optimal  130-159  mg/dL   Borderline  324-401  mg/dL   High  >027     mg/dL   Very High Performed at [facility], [address], [place], [place] [address]   Basic metabolic panel     Status: Abnormal   Collection Time: 07/16/23  3:24 AM  Result Value Ref Range   Sodium 120 (L) 135 - 145 mmol/L   Potassium 4.5 3.5 - 5.1 mmol/L   Chloride 90 (L) 98 - 111 mmol/L   CO2 22 22 - 32 mmol/L   Glucose, Bld 119 (H) 70 - 99 mg/dL    Comment: Glucose reference range applies only to samples taken after fasting for at least 8  hours.   BUN 17 8 - 23 mg/dL   Creatinine, Ser 4.40 (H) 0.44 - 1.00 mg/dL   Calcium 8.8 (L) 8.9 - 10.3 mg/dL   GFR, Estimated 45 (L) >60 mL/min    Comment: (NOTE) Calculated using the CKD-EPI Creatinine Equation (2021)    Anion gap 8 5 - 15    Comment: Performed at [facility], [address], [place], [place] [address]  Osmolality     Status: Abnormal   Collection Time: 07/16/23  5:14 AM  Result Value Ref Range   Osmolality 263 (L) 275 - 295 mOsm/kg    Comment: Performed at [facility], [address], [place], [place] [address]  HIV Antibody (routine testing w rflx)     Status: None   Collection Time: 07/16/23  5:14 AM  Result Value Ref Range   HIV Screen 4th Generation wRfx Non  Reactive Non Reactive    Comment: Performed at [facility], [address] [place], [place] [address]  Comprehensive metabolic panel     Status: Abnormal   Collection Time: 07/16/23  5:14 AM  Result Value Ref Range   Sodium 119 (LL) 135 - 145 mmol/L    Comment: DELTA CHECK NOTED CRITICAL RESULT CALLED TO, READ BACK BY AND VERIFIED WITH EANES,S AT 6:25AM ON 07/16/23 BY FESTERMAN,C    Potassium 4.6 3.5 - 5.1 mmol/L   Chloride 87 (L) 98 - 111 mmol/L   CO2 22 22 - 32 mmol/L   Glucose, Bld 100 (H) 70 - 99 mg/dL    Comment: Glucose reference range applies only to samples taken after fasting for at least 8 hours.   BUN 16 8 - 23 mg/dL   Creatinine, Ser 1.91 (H) 0.44 - 1.00 mg/dL   Calcium 8.9 8.9 - 47.8 mg/dL   Total Protein 6.2 (L) 6.5 - 8.1 g/dL   Albumin 4.0 3.5 - 5.0 g/dL   AST 34 15 - 41 U/L   ALT 27 0 - 44 U/L   Alkaline Phosphatase 66 38 - 126 U/L   Total Bilirubin 0.9 0.3 - 1.2 mg/dL   GFR, Estimated 45 (L) >60 mL/min    Comment: (NOTE) Calculated using the CKD-EPI Creatinine Equation (2021)    Anion gap 10 5 - 15    Comment: Performed at [facility], [address] [place], [place] [address]  CBC     Status: Abnormal   Collection Time: 07/16/23  5:14 AM  Result Value Ref  Range   WBC 6.4 4.0 - 10.5 K/uL   RBC 4.02 3.87 - 5.11 MIL/uL   Hemoglobin 11.9 (L) 12.0 - 15.0 g/dL   HCT 13.0 (L) 86.5 - 78.4 %   MCV 86.1 80.0 - 100.0 fL   MCH 29.6 26.0 - 34.0 pg   MCHC 34.4 30.0 - 36.0 g/dL   RDW 69.6 29.5 - 28.4 %   Platelets 184 150 - 400 K/uL   nRBC 0.0 0.0 - 0.2 %    Comment: Performed at [facility], [address], [place], [place] [address]  Magnesium     Status: Abnormal   Collection Time: 07/16/23  5:14 AM  Result Value Ref Range   Magnesium 1.5 (L) 1.7 - 2.4 mg/dL    Comment: Performed at [facility], [address], [place], [place] [address]  Phosphorus     Status: None   Collection Time: 07/16/23  5:14 AM  Result Value Ref Range   Phosphorus 4.2 2.5 - 4.6 mg/dL    Comment: Performed at [facility], [address], [place], [place] [address]  CBG monitoring, ED     Status: Abnormal   Collection Time: 07/16/23  7:40 AM  Result Value Ref Range   Glucose-Capillary 119 (H) 70 - 99 mg/dL    Comment: Glucose reference range applies only to samples taken after fasting for at least 8 hours.  Basic metabolic panel     Status: Abnormal   Collection Time: 07/16/23  8:54 AM  Result Value Ref Range   Sodium 118 (LL) 135 - 145 mmol/L    Comment: CRITICAL RESULT CALLED TO, READ BACK BY AND VERIFIED WITH WHITE,M AT 4403 ON 07/16/23 BY PURDIE,J   Potassium 4.7 3.5 - 5.1 mmol/L   Chloride 86 (L) 98 - 111 mmol/L   CO2 22 22 - 32 mmol/L   Glucose, Bld 140 (H) 70 - 99 mg/dL  Comment: Glucose reference range applies only to samples taken after fasting for at least 8 hours.   BUN 17 8 - 23 mg/dL   Creatinine, Ser 4.69 (H) 0.44 - 1.00 mg/dL   Calcium 9.0 8.9 - 62.9 mg/dL   GFR, Estimated 46 (L) >60 mL/min    Comment: (NOTE) Calculated using the CKD-EPI Creatinine Equation (2021)    Anion gap 10 5 - 15    Comment: Performed at [facility], [address], [place], [place] [address]  TSH     Status: None   Collection Time: 07/16/23  8:54 AM  Result Value Ref  Range   TSH 3.202 0.350 - 4.500 uIU/mL    Comment: Performed by a 3rd Generation assay with a functional sensitivity of <=0.01 uIU/mL. Performed at [facility], [address], [place], [place] [address]   CBG monitoring, ED     Status: Abnormal   Collection Time: 07/16/23 12:09 PM  Result Value Ref Range   Glucose-Capillary 190 (H) 70 - 99 mg/dL    Comment: Glucose reference range applies only to samples taken after fasting for at least 8 hours.   DG Chest Portable 1 View  Result Date: 07/15/2023 CLINICAL DATA:  Hyponatremia, weakness EXAM: PORTABLE CHEST 1 VIEW COMPARISON:  CTA chest 12/17/2007 FINDINGS: Cardiomegaly. Pulmonary vascular congestion. Mild interstitial thickening. No focal consolidation, pleural effusion, or pneumothorax. IMPRESSION: Findings most consistent with CHF and mild interstitial edema. Electronically Signed   By: Minerva Fester M.D.   On: 07/15/2023 19:54    Pending Labs Unresulted Labs (From admission, onward)     Start     Ordered   07/22/23 0500  Creatinine, serum  (enoxaparin (LOVENOX)    CrCl >/= 30 ml/min)  Weekly,   R     Comments: while on enoxaparin therapy    07/15/23 2227   07/17/23 0500  Comprehensive metabolic panel  Tomorrow morning,   R        07/16/23 0955   07/17/23 0500  Magnesium  Tomorrow morning,   R        07/16/23 0955   07/17/23 0500  CBC  Tomorrow morning,   R        07/16/23 0955   07/16/23 1800  Sodium  Once-Timed,   TIMED        07/16/23 1041            Vitals/Pain Today's Vitals   07/16/23 0800 07/16/23 1000 07/16/23 1143 07/16/23 1200  BP: (!) 133/53 (!) 149/54  (!) 154/40  Pulse: (!) 45 (!) 49  (!) 45  Resp: 19 17  16   Temp:   97.9 F (36.6 C)   TempSrc:   Oral   SpO2: 95% 95%  97%  Weight:      PainSc:        Isolation Precautions No active isolations  Medications Medications  pneumococcal 20-valent conjugate vaccine (PREVNAR 20) injection 0.5 mL (0 mLs Intramuscular Hold 07/16/23 0927)  influenza  vaccine adjuvanted (FLUAD) injection 0.5 mL (0 mLs Intramuscular Hold 07/16/23 0927)  enoxaparin (LOVENOX) injection 40 mg (40 mg Subcutaneous Given 07/16/23 0907)  ondansetron (ZOFRAN) tablet 4 mg (has no administration in time range)    Or  ondansetron (ZOFRAN) injection 4 mg (has no administration in time range)  atorvastatin (LIPITOR) tablet 40 mg (40 mg Oral Given 07/16/23 0906)  pantoprazole (PROTONIX) EC tablet 40 mg (40 mg Oral Given 07/16/23 0906)  albuterol (VENTOLIN HFA) 108 (90 Base) MCG/ACT inhaler 1-2 puff (  has no administration in time range)  insulin glargine-yfgn (SEMGLEE) injection 10 Units (has no administration in time range)  insulin aspart (novoLOG) injection 0-15 Units (3 Units Subcutaneous Given 07/16/23 1215)  0.9 %  sodium chloride infusion (0 mLs Intravenous Stopped 07/15/23 2032)  furosemide (LASIX) injection 40 mg (40 mg Intravenous Given 07/15/23 2049)  magnesium sulfate IVPB 2 g 50 mL (0 g Intravenous Stopped 07/16/23 0845)  tolvaptan (SAMSCA) tablet 15 mg (15 mg Oral Given 07/16/23 1216)    Mobility walks     Focused Assessments Hyponatremia   R Recommendations: See Admitting Provider Note  Report given to:   Additional Notes:

## 2023-07-16 NOTE — Progress Notes (Signed)
*  PRELIMINARY RESULTS* Echocardiogram 2D Echocardiogram has been performed with Definity.  Stacey Drain 07/16/2023, 1:04 PM

## 2023-07-16 NOTE — Progress Notes (Signed)
   07/16/23 1540  TOC Brief Assessment  Insurance and Status Reviewed  Patient has primary care physician Yes  Home environment has been reviewed Home with spouse  Prior level of function: independent  Social Determinants of Health Reivew SDOH reviewed no interventions necessary  Readmission risk has been reviewed Yes  Transition of care needs transition of care needs identified, TOC will continue to follow    Transition of Care Department (TOC) has reviewed patient and no TOC needs have been identified at this time. We will continue to monitor patient advancement through interdisciplinary progression rounds. If new patient transition needs arise, please place a TOC consult.

## 2023-07-16 NOTE — Progress Notes (Signed)
PROGRESS NOTE    Ebony Stevens  JJO:[id number] DOB: September 12, 1951 DOA: 07/15/2023 PCP: Benita Stabile, MD   Brief Narrative:    Ebony Stevens is a 72 y.o. female with medical history significant of cirrhosis, GERD, type 2 diabetes mellitus, asthma, hypertension, hyperlipidemia, sleep apnea who presents to the emergency department due to being asked to go to the ED by her PCP due to low levels of sodium noted on lab work.  Her spironolactone dose was recently increased by GI 2 days ago due to fluid retention.  She now has worsening serum sodium levels and will require some tolvaptan with close monitoring.  Assessment & Plan:   Principal Problem:   Hyponatremia Active Problems:   GERD without esophagitis   Benign essential HTN   Diabetes mellitus without complication (HCC)   Mixed hyperlipidemia   Bradycardia   Acute on chronic diastolic CHF (congestive heart failure) (HCC)   Fall at home, initial encounter   Obstructive sleep apnea   Obesity, Class III, BMI 40-49.9 (morbid obesity) (HCC)  Assessment and Plan:   Hyponatremia-worsening 1 L IV NS was initially provided IV Lasix 40 mg x 1 was given in the ED Continue to monitor sodium with serial BMPs Urine osmolality, serum osmolality and urine sodium will be checked Trial of tolvaptan and close monitoring TSH ordered and pending   Acute on chronic diastolic CHF Continue total input/output, daily weights and fluid restriction She was treated with IV Lasix 40 mg x 1 ; discontinue IV Lasix and start tolvaptan Continue heart healthy/modified carb diet       Echocardiogram done in September 2017 showed LVEF of 60 to 65%.  No RWMA.  G2 DD.  Echocardiogram will be done in the morning    Fall at home Continue fall precaution Continue PT/OT eval and treat   Bradycardia This may be due to medication (metoprolol) effect Consider dose reduction prior to discharge Continue telemetry Consider cardiology consult for worsening of  symptoms   Essential hypertension Continue Mucinex, lisinopril   Mixed hyperlipidemia Continue Lipitor  Hypomagnesemia Replete and monitor in a.m.   GERD Continue Protonix   Obstructive sleep apnea Continue CPAP   Type 2 diabetes mellitus Continue Semglee 10 units nightly and adjust dose accordingly Continue ISS and hypoglycemia protocol   Hepatic cirrhosis due to NASH Continue IV Lasix, spironolactone temporarily held due to ongoing hyponatremia Patient follows with [facility]ology   Morbid obesity (BMI 48.33) Patient was initially on Mounjaro, but this was stopped due to insurance issues Patient will need to follow-up with outpatient PCP for weight loss program   DVT prophylaxis:Lovenox Code Status: Full Family Communication: Spouse at bedside 10/4 Disposition Plan:  Status is: Inpatient Remains inpatient appropriate because: Need for IV medications.   Consultants:  Discussed with nephrology 10/4  Procedures:  None  Antimicrobials:  None   Subjective: Patient seen and evaluated today with no new acute complaints or concerns. No acute concerns or events noted overnight.  Objective: Vitals:   07/16/23 0200 07/16/23 0300 07/16/23 0400 07/16/23 0605  BP: (!) 142/49 (!) 172/41 (!) 134/48 (!) 140/57  Pulse: (!) 42 (!) 48 (!) 42 (!) 45  Resp: 16 19 15 15   Temp:  98 F (36.7 C)    TempSrc:  Oral    SpO2: 99% 98% 99% 98%  Weight:        Intake/Output Summary (Last 24 hours) at 07/16/2023 0721 Last data filed at 07/16/2023 0655 Gross per 24 hour  Intake --  Output 1550 ml  Net -1550 ml   Filed Weights   07/15/23 1544  Weight: 117.9 kg    Examination:  General exam: Appears calm and comfortable  Respiratory system: Clear to auscultation. Respiratory effort normal. Cardiovascular system: S1 & S2 heard, RRR.  Gastrointestinal system: Abdomen is soft Central nervous system: Alert and awake Extremities: No edema Skin: No  significant lesions noted Psychiatry: Flat affect.    Data Reviewed: I have personally reviewed following labs and imaging studies  CBC: Recent Labs  Lab 07/15/23 1606 07/16/23 0514  WBC 4.4 6.4  HGB 11.5* 11.9*  HCT 32.7* 34.6*  MCV 86.3 86.1  PLT 164 184   Basic Metabolic Panel: Recent Labs  Lab 07/15/23 1606 07/16/23 0324 07/16/23 0514  NA 117* 120* 119*  K 4.8 4.5 4.6  CL 87* 90* 87*  CO2 22 22 22   GLUCOSE 105* 119* 100*  BUN 15 17 16   CREATININE 1.27* 1.27* 1.27*  CALCIUM 8.7* 8.8* 8.9  MG  --   --  1.5*  PHOS  --   --  4.2   GFR: Estimated Creatinine Clearance: 49.8 mL/min (A) (by C-G formula based on SCr of 1.27 mg/dL (H)). Liver Function Tests: Recent Labs  Lab 07/16/23 0514  AST 34  ALT 27  ALKPHOS 66  BILITOT 0.9  PROT 6.2*  ALBUMIN 4.0   No results for input(s): "LIPASE", "AMYLASE" in the last 168 hours. Recent Labs  Lab 07/15/23 2023  AMMONIA <10   Coagulation Profile: No results for input(s): "INR", "PROTIME" in the last 168 hours. Cardiac Enzymes: No results for input(s): "CKTOTAL", "CKMB", "CKMBINDEX", "TROPONINI" in the last 168 hours. BNP (last 3 results) No results for input(s): "PROBNP" in the last 8760 hours. HbA1C: No results for input(s): "HGBA1C" in the last 72 hours. CBG: Recent Labs  Lab 07/15/23 1944  GLUCAP 93   Lipid Profile: Recent Labs    07/15/23 2024  CHOL 78  HDL 51  LDLCALC 13  TRIG 70  CHOLHDL 1.5   Thyroid Function Tests: No results for input(s): "TSH", "T4TOTAL", "FREET4", "T3FREE", "THYROIDAB" in the last 72 hours. Anemia Panel: No results for input(s): "VITAMINB12", "FOLATE", "FERRITIN", "TIBC", "IRON", "RETICCTPCT" in the last 72 hours. Sepsis Labs: No results for input(s): "PROCALCITON", "LATICACIDVEN" in the last 168 hours.  No results found for this or any previous visit (from the past 240 hour(s)).       Radiology Studies: DG Chest Portable 1 View  Result Date: 07/15/2023 CLINICAL  DATA:  Hyponatremia, weakness EXAM: PORTABLE CHEST 1 VIEW COMPARISON:  CTA chest 12/17/2007 FINDINGS: Cardiomegaly. Pulmonary vascular congestion. Mild interstitial thickening. No focal consolidation, pleural effusion, or pneumothorax. IMPRESSION: Findings most consistent with CHF and mild interstitial edema. Electronically Signed   By: Minerva Fester M.D.   On: 07/15/2023 19:54        Scheduled Meds:  atorvastatin  40 mg Oral Daily   enoxaparin (LOVENOX) injection  40 mg Subcutaneous Q24H   furosemide  40 mg Intravenous Q12H   influenza vaccine adjuvanted  0.5 mL Intramuscular Tomorrow-1000   insulin aspart  0-15 Units Subcutaneous TID WC   insulin glargine-yfgn  10 Units Subcutaneous QHS   lisinopril  40 mg Oral Daily   pantoprazole  40 mg Oral Daily   pneumococcal 20-valent conjugate vaccine  0.5 mL Intramuscular Tomorrow-1000     LOS: 1 day    Time spent: 35 minutes    Kimberely Mccannon Hoover Brunette, DO [facility]  If  7PM-7AM, please contact night-coverage [email or web address] 07/16/2023, 7:21 AM

## 2023-07-16 NOTE — Care Management Important Message (Signed)
Important Message  Patient Details  Name: Ebony Stevens MRN: [id number] Date of Birth: September 23, 1951   Important Message Given:  N/A - LOS <3 / Initial given by admissions     Corey Harold 07/16/2023, 3:56 PM

## 2023-07-16 NOTE — Evaluation (Signed)
Physical Therapy Evaluation Patient Details Name: Ebony Stevens MRN: [id number] DOB: 04/11/1951 Today's Date: 07/16/2023  History of Present Illness  Ebony Stevens is a 72 y.o. female with medical history significant of cirrhosis, GERD, type 2 diabetes mellitus, asthma, hypertension, hyperlipidemia, sleep apnea who presents to the emergency department due to being asked to go to the ED by her PCP.  Patient states that she had a recent visit to her physician yesterday, blood work was done and she received a call from the physician today to go to the ED due to low level of sodium.  Patient states that she was on Lasix and spironolactone and the spironolactone was recently increased to 50 mg by GI 2 days ago due to fluid retention.  She endorsed 2 falls in less than 2 weeks ago, but denies any injury during the fall.  She ambulates occasionally with a cane. (per DO)   Clinical Impression  Patient functioning near baseline for functional mobility and gait other than requiring occasional leaning on nearby objects for support when walking without AD.  Patient demonstrates good return for bed mobility, transfers and ambulating in room, hallway without loss of balance.  Plan:  Patient discharged from physical therapy to care of nursing for ambulation daily as tolerated for length of stay.          If plan is discharge home, recommend the following: Help with stairs or ramp for entrance   Can travel by private vehicle        Equipment Recommendations None recommended by PT  Recommendations for Other Services       Functional Status Assessment Patient has had a recent decline in their functional status and/or demonstrates limited ability to make significant improvements in function in a reasonable and predictable amount of time     Precautions / Restrictions Precautions Precautions: Fall Restrictions Weight Bearing Restrictions: No      Mobility  Bed Mobility Overal bed mobility:  Modified Independent                  Transfers Overall transfer level: Independent Equipment used: None                    Ambulation/Gait Ambulation/Gait assistance: Modified independent (Device/Increase time) Gait Distance (Feet): 120 Feet Assistive device: None Gait Pattern/deviations: Decreased step length - right, Decreased step length - left, Decreased stride length Gait velocity: decreased     General Gait Details: grossly WFL with good return for ambulating in room/hallways with occasional leaning on nearby objects, no loss of balance  Stairs            Wheelchair Mobility     Tilt Bed    Modified Rankin (Stroke Patients Only)       Balance Overall balance assessment: Mild deficits observed, not formally tested                                           Pertinent Vitals/Pain Pain Assessment Pain Assessment: Faces Faces Pain Scale: Hurts a little bit Pain Location: back pain Pain Descriptors / Indicators: Other (Comment) (chronic) Pain Intervention(s): Limited activity within patient's tolerance, Monitored during session, Repositioned    Home Living Family/patient expects to be discharged to:: Private residence Living Arrangements: Spouse/significant other Available Help at Discharge: Family;Available 24 hours/day Type of Home: Mobile home Home Access: Stairs to enter Entrance  Stairs-Rails: Can reach both Entrance Stairs-Number of Steps: 8   Home Layout: One level Home Equipment: Other (comment);Cane - single point (shower stool)      Prior Function Prior Level of Function : Independent/Modified Independent;History of Falls (last six months)             Mobility Comments: Houshold and community ambulator with PRN cane use. ADLs Comments: Independent but has assist PRN for bathing and dressing.     Extremity/Trunk Assessment   Upper Extremity Assessment Upper Extremity Assessment: Defer to OT  evaluation    Lower Extremity Assessment Lower Extremity Assessment: Overall WFL for tasks assessed    Cervical / Trunk Assessment Cervical / Trunk Assessment: Normal  Communication   Communication Communication: No apparent difficulties  Cognition Arousal: Alert Behavior During Therapy: WFL for tasks assessed/performed Overall Cognitive Status: Within Functional Limits for tasks assessed                                          General Comments      Exercises     Assessment/Plan    PT Assessment Patient does not need any further PT services  PT Problem List         PT Treatment Interventions      PT Goals (Current goals can be found in the Care Plan section)  Acute Rehab PT Goals Patient Stated Goal: return home with family to assist PT Goal Formulation: With patient/family Time For Goal Achievement: 07/16/23 Potential to Achieve Goals: Good    Frequency       Co-evaluation PT/OT/SLP Co-Evaluation/Treatment: Yes Reason for Co-Treatment: To address functional/ADL transfers PT goals addressed during session: Mobility/safety with mobility;Balance OT goals addressed during session: ADL's and self-care       AM-PAC PT "6 Clicks" Mobility  Outcome Measure Help needed turning from your back to your side while in a flat bed without using bedrails?: None Help needed moving from lying on your back to sitting on the side of a flat bed without using bedrails?: None Help needed moving to and from a bed to a chair (including a wheelchair)?: None Help needed standing up from a chair using your arms (e.g., wheelchair or bedside chair)?: None Help needed to walk in hospital room?: None Help needed climbing 3-5 steps with a railing? : A Little 6 Click Score: 23    End of Session   Activity Tolerance: Patient tolerated treatment well;Patient limited by fatigue Patient left: in bed;with call bell/phone within reach;with family/visitor present Nurse  Communication: Mobility status PT Visit Diagnosis: Unsteadiness on feet (R26.81);Other abnormalities of gait and mobility (R26.89);Muscle weakness (generalized) (M62.81)    Time: 0901-0920 PT Time Calculation (min) (ACUTE ONLY): 19 min   Charges:   PT Evaluation $PT Eval Low Complexity: 1 Low PT Treatments $Therapeutic Activity: 8-22 mins PT General Charges $$ ACUTE PT VISIT: 1 Visit         12:27 PM, 07/16/23 Ocie Bob, MPT Physical Therapist with [facility] [phone] office [phone] mobile phone

## 2023-07-16 NOTE — Evaluation (Signed)
Occupational Therapy Evaluation Patient Details Name: Ebony Stevens MRN: [id number] DOB: 07/25/1951 Today's Date: 07/16/2023   History of Present Illness Ebony Stevens is a 72 y.o. female with medical history significant of cirrhosis, GERD, type 2 diabetes mellitus, asthma, hypertension, hyperlipidemia, sleep apnea who presents to the emergency department due to being asked to go to the ED by her PCP.  Patient states that she had a recent visit to her physician yesterday, blood work was done and she received a call from the physician today to go to the ED due to low level of sodium.  Patient states that she was on Lasix and spironolactone and the spironolactone was recently increased to 50 mg by GI 2 days ago due to fluid retention.  She endorsed 2 falls in less than 2 weeks ago, but denies any injury during the fall.  She ambulates occasionally with a cane. (per DO)   Clinical Impression   Pt agreeable to OT and PT co-evaluation. Pt reports mostly independence at baseline with PRN assist for bathing and dressing. Pt uses a cane PRN. No cane needed today with pt completing bed mobility and ambulation in the hall without physical assist. Pt did report multiple falls over the past 6 months. Pt may benefit from home safety assessment by an OT if she desire. Pt left in the bed with husband present. Pt is not recommended for further acute OT services and will be discharged to care of nursing staff for remaining length of stay.              Functional Status Assessment  Patient has not had a recent decline in their functional status  Equipment Recommendations  None recommended by OT           Precautions / Restrictions Precautions Precautions: Fall Restrictions Weight Bearing Restrictions: No      Mobility Bed Mobility Overal bed mobility: Modified Independent                  Transfers Overall transfer level: Independent Equipment used: None                       Balance Overall balance assessment: Mild deficits observed, not formally tested                                         ADL either performed or assessed with clinical judgement   ADL Overall ADL's : Modified independent                                             Vision Baseline Vision/History: 1 Wears glasses Ability to See in Adequate Light: 1 Impaired Patient Visual Report: No change from baseline Vision Assessment?: Wears glasses for reading;No apparent visual deficits     Perception Perception: Not tested       Praxis Praxis: Not tested       Pertinent Vitals/Pain Pain Assessment Pain Assessment: Faces Faces Pain Scale: Hurts a little bit Pain Location: back pain Pain Descriptors / Indicators: Other (Comment) (chronic) Pain Intervention(s): Limited activity within patient's tolerance     Extremity/Trunk Assessment Upper Extremity Assessment Upper Extremity Assessment: Right hand dominant;Overall [facility] for tasks assessed   Lower Extremity Assessment Lower Extremity Assessment: Defer to  PT evaluation   Cervical / Trunk Assessment Cervical / Trunk Assessment: Normal   Communication Communication Communication: No apparent difficulties   Cognition Arousal: Alert Behavior During Therapy: WFL for tasks assessed/performed Overall Cognitive Status: Within Functional Limits for tasks assessed                                                        Home Living Family/patient expects to be discharged to:: Private residence Living Arrangements: Spouse/significant other Available Help at Discharge: Family;Available 24 hours/day Type of Home: Mobile home Home Access: Stairs to enter Entrance Stairs-Number of Steps: 8 Entrance Stairs-Rails: Can reach both Home Layout: One level     Bathroom Shower/Tub: Producer, television/film/video: Handicapped height Bathroom Accessibility:  (unsure)   Home  Equipment: Other (comment);Cane - single point (shower stool)          Prior Functioning/Environment Prior Level of Function : Independent/Modified Independent;History of Falls (last six months)             Mobility Comments: Houshold and community ambulator with PRN cane use. ADLs Comments: Independent but has assist PRN for bathing and dressing.                                Co-evaluation PT/OT/SLP Co-Evaluation/Treatment: Yes Reason for Co-Treatment: To address functional/ADL transfers   OT goals addressed during session: ADL's and self-care                       End of Session Equipment Utilized During Treatment: Rolling walker (2 wheels)  Activity Tolerance: Patient tolerated treatment well Patient left: in bed;with call bell/phone within reach;with family/visitor present  OT Visit Diagnosis: Unsteadiness on feet (R26.81);Other abnormalities of gait and mobility (R26.89);History of falling (Z91.81);Repeated falls (R29.6)                Time: 2952-8413 OT Time Calculation (min): 15 min Charges:  OT General Charges $OT Visit: 1 Visit OT Evaluation $OT Eval Low Complexity: 1 Low  Mikahla Wisor OT, MOT  Danie Chandler 07/16/2023, 10:13 AM

## 2023-07-17 DIAGNOSIS — R531 Weakness: Secondary | ICD-10-CM

## 2023-07-17 DIAGNOSIS — E871 Hypo-osmolality and hyponatremia: Secondary | ICD-10-CM | POA: Diagnosis not present

## 2023-07-17 DIAGNOSIS — Z9181 History of falling: Secondary | ICD-10-CM

## 2023-07-17 LAB — COMPREHENSIVE METABOLIC PANEL
ALT: 24 U/L (ref 0–44)
AST: 28 U/L (ref 15–41)
Albumin: 3.6 g/dL (ref 3.5–5.0)
Alkaline Phosphatase: 63 U/L (ref 38–126)
Anion gap: 9 (ref 5–15)
BUN: 16 mg/dL (ref 8–23)
CO2: 22 mmol/L (ref 22–32)
Calcium: 8.7 mg/dL — ABNORMAL LOW (ref 8.9–10.3)
Chloride: 90 mmol/L — ABNORMAL LOW (ref 98–111)
Creatinine, Ser: 1.31 mg/dL — ABNORMAL HIGH (ref 0.44–1.00)
GFR, Estimated: 44 mL/min — ABNORMAL LOW (ref >60)
Glucose, Bld: 138 mg/dL — ABNORMAL HIGH (ref 70–99)
Potassium: 4.4 mmol/L (ref 3.5–5.1)
Sodium: 121 mmol/L — ABNORMAL LOW (ref 135–145)
Total Bilirubin: 1 mg/dL (ref 0.3–1.2)
Total Protein: 5.8 g/dL — ABNORMAL LOW (ref 6.5–8.1)

## 2023-07-17 LAB — CBC
HCT: 30.9 % — ABNORMAL LOW (ref 36.0–46.0)
Hemoglobin: 11.1 g/dL — ABNORMAL LOW (ref 12.0–15.0)
MCH: 30.9 pg (ref 26.0–34.0)
MCHC: 35.9 g/dL (ref 30.0–36.0)
MCV: 86.1 fL (ref 80.0–100.0)
Platelets: 166 10*3/uL (ref 150–400)
RBC: 3.59 MIL/uL — ABNORMAL LOW (ref 3.87–5.11)
RDW: 14.9 % (ref 11.5–15.5)
WBC: 4.6 10*3/uL (ref 4.0–10.5)
nRBC: 0 % (ref 0.0–0.2)

## 2023-07-17 LAB — GLUCOSE, CAPILLARY
Glucose-Capillary: 106 mg/dL — ABNORMAL HIGH (ref 70–99)
Glucose-Capillary: 121 mg/dL — ABNORMAL HIGH (ref 70–99)
Glucose-Capillary: 186 mg/dL — ABNORMAL HIGH (ref 70–99)

## 2023-07-17 LAB — MAGNESIUM: Magnesium: 1.8 mg/dL (ref 1.7–2.4)

## 2023-07-17 LAB — MRSA NEXT GEN BY PCR, NASAL: MRSA by PCR Next Gen: NOT DETECTED

## 2023-07-17 MED ORDER — SODIUM CHLORIDE 1 G PO TABS
1.0000 g | ORAL_TABLET | Freq: Three times a day (TID) | ORAL | Status: DC
  Administered 2023-07-17 – 2023-07-19 (×6): 1 g via ORAL
  Filled 2023-07-17 (×6): qty 1

## 2023-07-17 MED ORDER — ENOXAPARIN SODIUM 60 MG/0.6ML IJ SOSY
60.0000 mg | PREFILLED_SYRINGE | INTRAMUSCULAR | Status: DC
  Administered 2023-07-18 – 2023-07-19 (×2): 60 mg via SUBCUTANEOUS
  Filled 2023-07-17 (×2): qty 0.6

## 2023-07-17 NOTE — Progress Notes (Incomplete)
Progress note    BP (!) 140/52 (BP Location: Right Arm)   Pulse (!) 55   Temp 97.6 F (36.4 C) (Oral)   Resp 20   Ht 5\' 2"  (1.575 m)   Wt 120.4 kg   SpO2 98%   BMI 48.55 kg/m

## 2023-07-17 NOTE — Progress Notes (Signed)
   07/17/23 2247  Vitals  BP (!) 140/52  MAP (mmHg) 75  BP Location Right Arm  BP Method Automatic  Patient Position (if appropriate) Lying  Pulse Rate (!) 55  Pulse Rate Source Monitor  Resp 20  Level of Consciousness  Level of Consciousness Alert  MEWS COLOR  MEWS Score Color Green  Oxygen Therapy  SpO2 96 %  O2 Device Room Air  MEWS Score  MEWS Temp 0  MEWS Systolic 0  MEWS Pulse 0  MEWS RR 0  MEWS LOC 0  MEWS Score 0

## 2023-07-17 NOTE — Progress Notes (Addendum)
Called to pt room about patient "feeling real weak". Found patient on floor next to bed. She was heaving, nauseated, and SOB. Glucose 186 at his time. She reported like "things are going black". After several minutes, patient able to get herself back onto bed with 2 assist. NIH stroke scale 0. Strength in legs and arms, grips 4/5 , although patient reports her legs are weak. MD notified.   07/17/23 2205  Vitals  Temp 97.6 F (36.4 C)  Temp Source Oral  BP (!) 162/45  MAP (mmHg) 73  BP Location Right Arm  BP Method Automatic  Patient Position (if appropriate) Lying  Pulse Rate (!) 52  Pulse Rate Source Monitor  ECG Heart Rate (!) 56  Resp 20  Level of Consciousness  Level of Consciousness Alert  MEWS COLOR  MEWS Score Color Green  Oxygen Therapy  SpO2 96 %  O2 Device Room Air  Pain Assessment  Pain Scale 0-10  Pain Score 0  MEWS Score  MEWS Temp 0  MEWS Systolic 0  MEWS Pulse 0  MEWS RR 0  MEWS LOC 0  MEWS Score 0

## 2023-07-17 NOTE — Plan of Care (Signed)
Problem: Education: Goal: Knowledge of General Education information will improve Description: Including pain rating scale, medication(s)/side effects and non-pharmacologic comfort measures 07/16/2023 2250 by Tommi Emery, RN Outcome: Progressing 07/16/2023 2250 by Tommi Emery, RN Outcome: Progressing   Problem: Health Behavior/Discharge Planning: Goal: Ability to manage health-related needs will improve 07/16/2023 2250 by Tommi Emery, RN Outcome: Progressing 07/16/2023 2250 by Tommi Emery, RN Outcome: Progressing   Problem: Clinical Measurements: Goal: Ability to maintain clinical measurements within normal limits will improve 07/16/2023 2250 by Tommi Emery, RN Outcome: Progressing 07/16/2023 2250 by Tommi Emery, RN Outcome: Progressing Goal: Will remain free from infection 07/16/2023 2250 by Tommi Emery, RN Outcome: Progressing 07/16/2023 2250 by Tommi Emery, RN Outcome: Progressing Goal: Diagnostic test results will improve 07/16/2023 2250 by Tommi Emery, RN Outcome: Progressing 07/16/2023 2250 by Tommi Emery, RN Outcome: Progressing Goal: Respiratory complications will improve 07/16/2023 2250 by Tommi Emery, RN Outcome: Progressing 07/16/2023 2250 by Tommi Emery, RN Outcome: Progressing Goal: Cardiovascular complication will be avoided 07/16/2023 2250 by Tommi Emery, RN Outcome: Progressing 07/16/2023 2250 by Tommi Emery, RN Outcome: Progressing   Problem: Activity: Goal: Risk for activity intolerance will decrease 07/16/2023 2250 by Tommi Emery, RN Outcome: Progressing 07/16/2023 2250 by Tommi Emery, RN Outcome: Progressing   Problem: Nutrition: Goal: Adequate nutrition will be maintained 07/16/2023 2250 by Tommi Emery, RN Outcome: Progressing 07/16/2023 2250 by Tommi Emery, RN Outcome: Progressing   Problem: Coping: Goal: Level of anxiety will decrease 07/16/2023 2250 by Tommi Emery, RN Outcome:  Progressing 07/16/2023 2250 by Tommi Emery, RN Outcome: Progressing   Problem: Elimination: Goal: Will not experience complications related to bowel motility 07/16/2023 2250 by Tommi Emery, RN Outcome: Progressing 07/16/2023 2250 by Tommi Emery, RN Outcome: Progressing Goal: Will not experience complications related to urinary retention 07/16/2023 2250 by Tommi Emery, RN Outcome: Progressing 07/16/2023 2250 by Tommi Emery, RN Outcome: Progressing   Problem: Pain Managment: Goal: General experience of comfort will improve 07/16/2023 2250 by Tommi Emery, RN Outcome: Progressing 07/16/2023 2250 by Tommi Emery, RN Outcome: Progressing   Problem: Safety: Goal: Ability to remain free from injury will improve 07/16/2023 2250 by Tommi Emery, RN Outcome: Progressing 07/16/2023 2250 by Tommi Emery, RN Outcome: Progressing   Problem: Skin Integrity: Goal: Risk for impaired skin integrity will decrease 07/16/2023 2250 by Tommi Emery, RN Outcome: Progressing 07/16/2023 2250 by Tommi Emery, RN Outcome: Progressing   Problem: Education: Goal: Ability to describe self-care measures that may prevent or decrease complications (Diabetes Survival Skills Education) will improve 07/16/2023 2250 by Tommi Emery, RN Outcome: Progressing 07/16/2023 2250 by Tommi Emery, RN Outcome: Progressing Goal: Individualized Educational Video(s) 07/16/2023 2250 by Tommi Emery, RN Outcome: Progressing 07/16/2023 2250 by Tommi Emery, RN Outcome: Progressing   Problem: Coping: Goal: Ability to adjust to condition or change in health will improve 07/16/2023 2250 by Tommi Emery, RN Outcome: Progressing 07/16/2023 2250 by Tommi Emery, RN Outcome: Progressing   Problem: Fluid Volume: Goal: Ability to maintain a balanced intake and output will improve 07/16/2023 2250 by Tommi Emery, RN Outcome: Progressing 07/16/2023 2250 by Tommi Emery, RN Outcome: Progressing    Problem: Health Behavior/Discharge Planning: Goal: Ability to identify and utilize available resources and services will improve 07/16/2023 2250 by Tommi Emery, RN Outcome: Progressing 07/16/2023 2250 by Tommi Emery,  RN Outcome: Progressing Goal: Ability to manage health-related needs will improve 07/16/2023 2250 by Tommi Emery, RN Outcome: Progressing 07/16/2023 2250 by Tommi Emery, RN Outcome: Progressing   Problem: Metabolic: Goal: Ability to maintain appropriate glucose levels will improve 07/16/2023 2250 by Tommi Emery, RN Outcome: Progressing 07/16/2023 2250 by Tommi Emery, RN Outcome: Progressing   Problem: Nutritional: Goal: Maintenance of adequate nutrition will improve 07/16/2023 2250 by Tommi Emery, RN Outcome: Progressing 07/16/2023 2250 by Tommi Emery, RN Outcome: Progressing Goal: Progress toward achieving an optimal weight will improve 07/16/2023 2250 by Tommi Emery, RN Outcome: Progressing 07/16/2023 2250 by Tommi Emery, RN Outcome: Progressing   Problem: Skin Integrity: Goal: Risk for impaired skin integrity will decrease 07/16/2023 2250 by Tommi Emery, RN Outcome: Progressing 07/16/2023 2250 by Tommi Emery, RN Outcome: Progressing   Problem: Tissue Perfusion: Goal: Adequacy of tissue perfusion will improve 07/16/2023 2250 by Tommi Emery, RN Outcome: Progressing 07/16/2023 2250 by Tommi Emery, RN Outcome: Progressing

## 2023-07-17 NOTE — Progress Notes (Signed)
PROGRESS NOTE    Ebony Stevens  BJY:[id number] DOB: 1951/04/06 DOA: 07/15/2023 PCP: Benita Stabile, MD   Brief Narrative:    Ebony Stevens is a 72 y.o. female with medical history significant of cirrhosis, GERD, type 2 diabetes mellitus, asthma, hypertension, hyperlipidemia, sleep apnea who presents to the emergency department due to being asked to go to the ED by her PCP due to low levels of sodium noted on lab work.  Her spironolactone dose was recently increased by GI 2 days ago due to fluid retention.  She had a dose of tolvaptan on 10/4 with minimal improvement in sodium levels.  She appears to have a bland diet and will start sodium supplementation today.  Assessment & Plan:   Principal Problem:   Hyponatremia Active Problems:   GERD without esophagitis   Benign essential HTN   Diabetes mellitus without complication (HCC)   Mixed hyperlipidemia   Bradycardia   Acute on chronic diastolic CHF (congestive heart failure) (HCC)   Fall at home, initial encounter   Obstructive sleep apnea   Obesity, Class III, BMI 40-49.9 (morbid obesity) (HCC)  Assessment and Plan:   Hyponatremia-slightly improved 1 L IV NS was initially provided IV Lasix 40 mg x 1 was given in the ED Continue to monitor sodium with serial BMPs Urine osmolality, serum osmolality and urine sodium will be checked Trial of tolvaptan and close monitoring TSH within normal limits and no sign of SIADH Patient states that she has been avoiding sodium and losing quite a bit of weight recently with [facility] Plan to supplement sodium and monitor labs   Acute on chronic diastolic CHF Continue total input/output, daily weights and fluid restriction She was treated with IV Lasix 40 mg x 1 ; discontinue IV Lasix and start tolvaptan Continue heart healthy/modified carb diet       2D echocardiogram with preserved LVEF 60-65%   Fall at home Continue fall precaution No need for PT services   Bradycardia This may be  du-improved e to medication (metoprolol) effect Consider dose reduction prior to discharge or discontinuation Continue telemetry   Essential hypertension Continue Mucinex, lisinopril   Mixed hyperlipidemia Continue Lipitor   GERD Continue Protonix   Obstructive sleep apnea Continue CPAP   Type 2 diabetes mellitus Continue Semglee 10 units nightly and adjust dose accordingly Continue ISS and hypoglycemia protocol   Hepatic cirrhosis due to NASH Continue IV Lasix, spironolactone temporarily held due to ongoing hyponatremia Patient follows with [facility] gastroenterology   Morbid obesity (BMI 48.33) Patient was initially on Mounjaro, but this was stopped due to insurance issues Patient will need to follow-up with outpatient PCP for weight loss program   DVT prophylaxis:Lovenox Code Status: Full Family Communication: Spouse at bedside 10/5 Disposition Plan:  Status is: Inpatient Remains inpatient appropriate because: Need for IV medications.   Consultants:  Discussed with nephrology 10/4  Procedures:  None  Antimicrobials:  None   Subjective: Patient seen and evaluated today with minimal improvement in sodium levels noted overnight.  She states that she has been trying to avoid sodium in foods and losing weight recently.  Objective: Vitals:   07/17/23 0500 07/17/23 0600 07/17/23 0619 07/17/23 0803  BP: (!) 124/25  (!) 108/43   Pulse: (!) 52 (!) 48 (!) 53   Resp: 17 12 17    Temp: 98 F (36.7 C)   98 F (36.7 C)  TempSrc: Oral   Oral  SpO2: 94% 96% 96%   Weight: 120.4  PROGRESS NOTE    Ebony Stevens  BJY:[id number] DOB: 1951/04/06 DOA: 07/15/2023 PCP: Benita Stabile, MD   Brief Narrative:    Ebony Stevens is a 72 y.o. female with medical history significant of cirrhosis, GERD, type 2 diabetes mellitus, asthma, hypertension, hyperlipidemia, sleep apnea who presents to the emergency department due to being asked to go to the ED by her PCP due to low levels of sodium noted on lab work.  Her spironolactone dose was recently increased by GI 2 days ago due to fluid retention.  She had a dose of tolvaptan on 10/4 with minimal improvement in sodium levels.  She appears to have a bland diet and will start sodium supplementation today.  Assessment & Plan:   Principal Problem:   Hyponatremia Active Problems:   GERD without esophagitis   Benign essential HTN   Diabetes mellitus without complication (HCC)   Mixed hyperlipidemia   Bradycardia   Acute on chronic diastolic CHF (congestive heart failure) (HCC)   Fall at home, initial encounter   Obstructive sleep apnea   Obesity, Class III, BMI 40-49.9 (morbid obesity) (HCC)  Assessment and Plan:   Hyponatremia-slightly improved 1 L IV NS was initially provided IV Lasix 40 mg x 1 was given in the ED Continue to monitor sodium with serial BMPs Urine osmolality, serum osmolality and urine sodium will be checked Trial of tolvaptan and close monitoring TSH within normal limits and no sign of SIADH Patient states that she has been avoiding sodium and losing quite a bit of weight recently with [facility] Plan to supplement sodium and monitor labs   Acute on chronic diastolic CHF Continue total input/output, daily weights and fluid restriction She was treated with IV Lasix 40 mg x 1 ; discontinue IV Lasix and start tolvaptan Continue heart healthy/modified carb diet       2D echocardiogram with preserved LVEF 60-65%   Fall at home Continue fall precaution No need for PT services   Bradycardia This may be  du-improved e to medication (metoprolol) effect Consider dose reduction prior to discharge or discontinuation Continue telemetry   Essential hypertension Continue Mucinex, lisinopril   Mixed hyperlipidemia Continue Lipitor   GERD Continue Protonix   Obstructive sleep apnea Continue CPAP   Type 2 diabetes mellitus Continue Semglee 10 units nightly and adjust dose accordingly Continue ISS and hypoglycemia protocol   Hepatic cirrhosis due to NASH Continue IV Lasix, spironolactone temporarily held due to ongoing hyponatremia Patient follows with [facility] health [facility] gastroenterology   Morbid obesity (BMI 48.33) Patient was initially on Mounjaro, but this was stopped due to insurance issues Patient will need to follow-up with outpatient PCP for weight loss program   DVT prophylaxis:Lovenox Code Status: Full Family Communication: Spouse at bedside 10/5 Disposition Plan:  Status is: Inpatient Remains inpatient appropriate because: Need for IV medications.   Consultants:  Discussed with nephrology 10/4  Procedures:  None  Antimicrobials:  None   Subjective: Patient seen and evaluated today with minimal improvement in sodium levels noted overnight.  She states that she has been trying to avoid sodium in foods and losing weight recently.  Objective: Vitals:   07/17/23 0500 07/17/23 0600 07/17/23 0619 07/17/23 0803  BP: (!) 124/25  (!) 108/43   Pulse: (!) 52 (!) 48 (!) 53   Resp: 17 12 17    Temp: 98 F (36.7 C)   98 F (36.7 C)  TempSrc: Oral   Oral  SpO2: 94% 96% 96%   Weight: 120.4  a comprehensive MRSA colonization surveillance program. It is not intended to diagnose MRSA infection nor to guide or monitor treatment for MRSA infections. Test performance is not FDA approved in patients less than 68 years old. Performed at [facility], [address], [place], [place] [address]          Radiology Studies: ECHOCARDIOGRAM COMPLETE  Result Date: 07/16/2023    ECHOCARDIOGRAM REPORT   Patient Name:   SPRUHA WEIGHT Date of Exam: 07/16/2023 Medical Rec #:  [id number]       Height:       61.5 in Accession #:    [id number]      Weight:       260.0 lb Date of Birth:  11-Dec-1950       BSA:          2.124 m Patient Age:    72 years        BP:           129/85 mmHg Patient Gender: F               HR:           52 bpm. [facility]am [facility] Procedure: 2D Echo, Cardiac Doppler, Color Doppler and Intracardiac            Opacification Agent Indications:    CHF-Acute Diastolic I50.31  History:        Patient has prior history of Echocardiogram examinations, most                 recent 03/30/2023. CHF, Arrythmias:Bradycardia; Risk                 Factors:Hypertension, Diabetes and Dyslipidemia. Obesity, Class                 III, Obstructive sleep apnea.  Sonographer:    Celesta Gentile RCS Referring Phys: [id number] OLADAPO ADEFESO   Sonographer Comments: Suboptimal subcostal window. IMPRESSIONS  1. Left ventricular ejection fraction, by estimation, is 60 to 65%. The left ventricle has normal function. The left ventricle has no regional wall motion abnormalities. Left ventricular diastolic parameters were normal.  2. Right ventricular systolic function is normal. The right ventricular size is normal.  3. Left atrial size was mildly dilated.  4. The mitral valve is normal in structure. No evidence of mitral valve regurgitation. No evidence of mitral stenosis.  5. The aortic valve is tricuspid. There is mild calcification of the aortic valve. There is mild thickening of the aortic valve. Aortic valve regurgitation is not visualized. Aortic valve sclerosis is present, with no evidence of aortic valve stenosis.  6. The inferior vena cava is normal in size with greater than 50% respiratory variability, suggesting right atrial pressure of 3 mmHg. FINDINGS  Left Ventricle: Left ventricular ejection fraction, by estimation, is 60 to 65%. The left ventricle has normal function. The left ventricle has no regional wall motion abnormalities. The left ventricular internal cavity size was normal in size. There is  no left ventricular hypertrophy. Left ventricular diastolic parameters were normal. Right Ventricle: The right ventricular size is normal. No increase in right ventricular wall thickness. Right ventricular systolic function is normal. Left Atrium: Left atrial size was mildly dilated. Right Atrium: Right atrial size was normal in size. Pericardium: There is no evidence of pericardial effusion. Mitral Valve: The mitral valve is normal in structure. No evidence of mitral valve regurgitation. No evidence of mitral valve stenosis. Tricuspid Valve: The tricuspid valve  PROGRESS NOTE    Ebony Stevens  BJY:[id number] DOB: 1951/04/06 DOA: 07/15/2023 PCP: Benita Stabile, MD   Brief Narrative:    Ebony Stevens is a 72 y.o. female with medical history significant of cirrhosis, GERD, type 2 diabetes mellitus, asthma, hypertension, hyperlipidemia, sleep apnea who presents to the emergency department due to being asked to go to the ED by her PCP due to low levels of sodium noted on lab work.  Her spironolactone dose was recently increased by GI 2 days ago due to fluid retention.  She had a dose of tolvaptan on 10/4 with minimal improvement in sodium levels.  She appears to have a bland diet and will start sodium supplementation today.  Assessment & Plan:   Principal Problem:   Hyponatremia Active Problems:   GERD without esophagitis   Benign essential HTN   Diabetes mellitus without complication (HCC)   Mixed hyperlipidemia   Bradycardia   Acute on chronic diastolic CHF (congestive heart failure) (HCC)   Fall at home, initial encounter   Obstructive sleep apnea   Obesity, Class III, BMI 40-49.9 (morbid obesity) (HCC)  Assessment and Plan:   Hyponatremia-slightly improved 1 L IV NS was initially provided IV Lasix 40 mg x 1 was given in the ED Continue to monitor sodium with serial BMPs Urine osmolality, serum osmolality and urine sodium will be checked Trial of tolvaptan and close monitoring TSH within normal limits and no sign of SIADH Patient states that she has been avoiding sodium and losing quite a bit of weight recently with [facility] Plan to supplement sodium and monitor labs   Acute on chronic diastolic CHF Continue total input/output, daily weights and fluid restriction She was treated with IV Lasix 40 mg x 1 ; discontinue IV Lasix and start tolvaptan Continue heart healthy/modified carb diet       2D echocardiogram with preserved LVEF 60-65%   Fall at home Continue fall precaution No need for PT services   Bradycardia This may be  du-improved e to medication (metoprolol) effect Consider dose reduction prior to discharge or discontinuation Continue telemetry   Essential hypertension Continue Mucinex, lisinopril   Mixed hyperlipidemia Continue Lipitor   GERD Continue Protonix   Obstructive sleep apnea Continue CPAP   Type 2 diabetes mellitus Continue Semglee 10 units nightly and adjust dose accordingly Continue ISS and hypoglycemia protocol   Hepatic cirrhosis due to NASH Continue IV Lasix, spironolactone temporarily held due to ongoing hyponatremia Patient follows with [facility] health [facility] gastroenterology   Morbid obesity (BMI 48.33) Patient was initially on Mounjaro, but this was stopped due to insurance issues Patient will need to follow-up with outpatient PCP for weight loss program   DVT prophylaxis:Lovenox Code Status: Full Family Communication: Spouse at bedside 10/5 Disposition Plan:  Status is: Inpatient Remains inpatient appropriate because: Need for IV medications.   Consultants:  Discussed with nephrology 10/4  Procedures:  None  Antimicrobials:  None   Subjective: Patient seen and evaluated today with minimal improvement in sodium levels noted overnight.  She states that she has been trying to avoid sodium in foods and losing weight recently.  Objective: Vitals:   07/17/23 0500 07/17/23 0600 07/17/23 0619 07/17/23 0803  BP: (!) 124/25  (!) 108/43   Pulse: (!) 52 (!) 48 (!) 53   Resp: 17 12 17    Temp: 98 F (36.7 C)   98 F (36.7 C)  TempSrc: Oral   Oral  SpO2: 94% 96% 96%   Weight: 120.4  PROGRESS NOTE    Ebony Stevens  BJY:[id number] DOB: 1951/04/06 DOA: 07/15/2023 PCP: Benita Stabile, MD   Brief Narrative:    Ebony Stevens is a 72 y.o. female with medical history significant of cirrhosis, GERD, type 2 diabetes mellitus, asthma, hypertension, hyperlipidemia, sleep apnea who presents to the emergency department due to being asked to go to the ED by her PCP due to low levels of sodium noted on lab work.  Her spironolactone dose was recently increased by GI 2 days ago due to fluid retention.  She had a dose of tolvaptan on 10/4 with minimal improvement in sodium levels.  She appears to have a bland diet and will start sodium supplementation today.  Assessment & Plan:   Principal Problem:   Hyponatremia Active Problems:   GERD without esophagitis   Benign essential HTN   Diabetes mellitus without complication (HCC)   Mixed hyperlipidemia   Bradycardia   Acute on chronic diastolic CHF (congestive heart failure) (HCC)   Fall at home, initial encounter   Obstructive sleep apnea   Obesity, Class III, BMI 40-49.9 (morbid obesity) (HCC)  Assessment and Plan:   Hyponatremia-slightly improved 1 L IV NS was initially provided IV Lasix 40 mg x 1 was given in the ED Continue to monitor sodium with serial BMPs Urine osmolality, serum osmolality and urine sodium will be checked Trial of tolvaptan and close monitoring TSH within normal limits and no sign of SIADH Patient states that she has been avoiding sodium and losing quite a bit of weight recently with [facility] Plan to supplement sodium and monitor labs   Acute on chronic diastolic CHF Continue total input/output, daily weights and fluid restriction She was treated with IV Lasix 40 mg x 1 ; discontinue IV Lasix and start tolvaptan Continue heart healthy/modified carb diet       2D echocardiogram with preserved LVEF 60-65%   Fall at home Continue fall precaution No need for PT services   Bradycardia This may be  du-improved e to medication (metoprolol) effect Consider dose reduction prior to discharge or discontinuation Continue telemetry   Essential hypertension Continue Mucinex, lisinopril   Mixed hyperlipidemia Continue Lipitor   GERD Continue Protonix   Obstructive sleep apnea Continue CPAP   Type 2 diabetes mellitus Continue Semglee 10 units nightly and adjust dose accordingly Continue ISS and hypoglycemia protocol   Hepatic cirrhosis due to NASH Continue IV Lasix, spironolactone temporarily held due to ongoing hyponatremia Patient follows with [facility] gastroenterology   Morbid obesity (BMI 48.33) Patient was initially on Mounjaro, but this was stopped due to insurance issues Patient will need to follow-up with outpatient PCP for weight loss program   DVT prophylaxis:Lovenox Code Status: Full Family Communication: Spouse at bedside 10/5 Disposition Plan:  Status is: Inpatient Remains inpatient appropriate because: Need for IV medications.   Consultants:  Discussed with nephrology 10/4  Procedures:  None  Antimicrobials:  None   Subjective: Patient seen and evaluated today with minimal improvement in sodium levels noted overnight.  She states that she has been trying to avoid sodium in foods and losing weight recently.  Objective: Vitals:   07/17/23 0500 07/17/23 0600 07/17/23 0619 07/17/23 0803  BP: (!) 124/25  (!) 108/43   Pulse: (!) 52 (!) 48 (!) 53   Resp: 17 12 17    Temp: 98 F (36.7 C)   98 F (36.7 C)  TempSrc: Oral   Oral  SpO2: 94% 96% 96%   Weight: 120.4

## 2023-07-17 NOTE — Progress Notes (Signed)
Progress note RN called due to finding patient on the floor next to bed.  Apparently, patient complained of feeling weak, nauseated and short of breath.  She denies falling off the bed and states that she sled down the bed, she initially felt dizzy, but recovered within few minutes and was assisted back to bed.  NIH stroke scale was 0, she complained of weakness in the legs. Patient had another episode about 40 minutes later and she complained of tremors in the legs.  Patient was advised to stay in bed and to notify RN for any assistance needed    BP (!) 140/52 (BP Location: Right Arm)   Pulse (!) 55   Temp 97.6 F (36.4 C) (Oral)   Resp 20   Ht 5\' 2"  (1.575 m)   Wt 120.4 kg   SpO2 98%   BMI 48.55 kg/m   Generalized weakness Patient is at increased risk of fall Continue fall precautions Patient with hyponatremia, current sodium level is at 124, continue to monitor sodium level  Please refer to progress note for details regarding the care of this patient  Total time:  28  minutes This includes time reviewing the chart including progress notes, labs, EKGs, taking medical decisions, ordering labs and documenting findings.

## 2023-07-18 DIAGNOSIS — E871 Hypo-osmolality and hyponatremia: Secondary | ICD-10-CM | POA: Diagnosis not present

## 2023-07-18 LAB — GLUCOSE, CAPILLARY
Glucose-Capillary: 156 mg/dL — ABNORMAL HIGH (ref 70–99)
Glucose-Capillary: 130 mg/dL — ABNORMAL HIGH (ref 70–99)
Glucose-Capillary: 137 mg/dL — ABNORMAL HIGH (ref 70–99)
Glucose-Capillary: 130 mg/dL — ABNORMAL HIGH (ref 70–99)

## 2023-07-18 LAB — BASIC METABOLIC PANEL
Anion gap: 8 (ref 5–15)
BUN: 16 mg/dL (ref 8–23)
CO2: 23 mmol/L (ref 22–32)
Calcium: 9.1 mg/dL (ref 8.9–10.3)
Chloride: 93 mmol/L — ABNORMAL LOW (ref 98–111)
Creatinine, Ser: 1.25 mg/dL — ABNORMAL HIGH (ref 0.44–1.00)
GFR, Estimated: 46 mL/min — ABNORMAL LOW (ref >60)
Glucose, Bld: 148 mg/dL — ABNORMAL HIGH (ref 70–99)
Potassium: 4.8 mmol/L (ref 3.5–5.1)
Sodium: 124 mmol/L — ABNORMAL LOW (ref 135–145)

## 2023-07-18 LAB — MAGNESIUM: Magnesium: 1.7 mg/dL (ref 1.7–2.4)

## 2023-07-18 MED ORDER — SODIUM CHLORIDE 1 G PO TABS: 1.0000 g | ORAL_TABLET | Freq: Three times a day (TID) | ORAL | 0 refills | Status: AC

## 2023-07-18 MED ORDER — METOPROLOL SUCCINATE ER 25 MG PO TB24: 25.0000 mg | ORAL_TABLET | Freq: Every day | ORAL | 1 refills | Status: DC

## 2023-07-18 MED ORDER — METOPROLOL SUCCINATE ER 25 MG PO TB24
25.0000 mg | ORAL_TABLET | Freq: Every day | ORAL | Status: DC
  Administered 2023-07-18 – 2023-07-19 (×2): 25 mg via ORAL
  Filled 2023-07-18 (×2): qty 1

## 2023-07-18 MED ORDER — SPIRONOLACTONE 25 MG PO TABS
50.0000 mg | ORAL_TABLET | Freq: Every day | ORAL | Status: DC
  Administered 2023-07-18 – 2023-07-19 (×2): 50 mg via ORAL
  Filled 2023-07-18 (×2): qty 2

## 2023-07-18 MED ORDER — FUROSEMIDE 40 MG PO TABS
40.0000 mg | ORAL_TABLET | Freq: Every day | ORAL | Status: DC
  Administered 2023-07-18 – 2023-07-19 (×2): 40 mg via ORAL
  Filled 2023-07-18 (×2): qty 1

## 2023-07-18 NOTE — Progress Notes (Signed)
PROGRESS NOTE    Ebony Stevens  KXF:[id number] DOB: 05-Feb-1951 DOA: 07/15/2023 PCP: Benita Stabile, MD   Brief Narrative:    Ebony Stevens is a 72 y.o. female with medical history significant of cirrhosis, GERD, type 2 diabetes mellitus, asthma, hypertension, hyperlipidemia, sleep apnea who presents to the emergency department due to being asked to go to the ED by her PCP due to low levels of sodium noted on lab work.  Her spironolactone dose was recently increased by GI 2 days ago due to fluid retention.  She had a dose of tolvaptan on 10/4 with minimal improvement in sodium levels.  She appears to have a bland diet and will start sodium supplementation today.  Assessment & Plan:   Principal Problem:   Hyponatremia Active Problems:   GERD without esophagitis   Benign essential HTN   Diabetes mellitus without complication (HCC)   Mixed hyperlipidemia   Bradycardia   Acute on chronic diastolic CHF (congestive heart failure) (HCC)   Fall at home, initial encounter   Obstructive sleep apnea   Obesity, Class III, BMI 40-49.9 (morbid obesity) (HCC)  Assessment and Plan:   Hyponatremia-improving with sodium supplementation 1 L IV NS was initially provided IV Lasix 40 mg x 1 was given in the ED Continue to monitor sodium with serial BMPs TSH within normal limits and no sign of SIADH Patient states that she has been avoiding sodium and losing quite a bit of weight recently with Mounjaro Continue sodium supplementation   Acute on chronic diastolic CHF Continue total input/output, daily weights and fluid restriction She was treated with IV Lasix 40 mg x 1 ; discontinue IV Lasix and start tolvaptan Continue heart healthy/modified carb diet       2D echocardiogram with preserved LVEF 60-65%   Fall at home Continue fall precaution No need for PT services Noted to have fall overnight, continue close monitoring   Bradycardia This may be du-improved e to medication (metoprolol)  effect Consider dose reduction prior to discharge or discontinuation Continue telemetry   Essential hypertension Continue metoprolol at 25 mg daily Hold lisinopril   Mixed hyperlipidemia Continue Lipitor   GERD Continue Protonix   Obstructive sleep apnea Continue CPAP   Type 2 diabetes mellitus Continue Semglee 10 units nightly and adjust dose accordingly Continue ISS and hypoglycemia protocol Hold glipizide   Hepatic cirrhosis due to NASH Resume home oral Lasix and spironolactone Patient follows with [facility] gastroenterology   Morbid obesity (BMI 48.33) Patient was initially on Mounjaro, but this was stopped due to insurance issues Patient will need to follow-up with outpatient PCP for weight loss program   DVT prophylaxis:Lovenox Code Status: Full Family Communication: Spouse at bedside 10/5 Disposition Plan:  Status is: Inpatient Remains inpatient appropriate because: Need for IV medications.   Consultants:  Discussed with nephrology 10/4  Procedures:  None  Antimicrobials:  None   Subjective: Patient seen and evaluated today with minimal improvement in sodium levels noted overnight.  She states that she has been trying to avoid sodium in foods and losing weight recently.  She was noted to have some dizziness and a fall last night.  States that she is not eating very well and feels somewhat nauseous this morning.  Objective: Vitals:   07/17/23 2247 07/17/23 2300 07/17/23 2350 07/18/23 0600  BP: (!) 140/52   (!) 179/44  Pulse: (!) 55  64 63  Resp: 20  20 20   Temp:    (!) 97.5 F (  NOTE) The GeneXpert MRSA Assay (FDA approved for NASAL specimens only), is one component of a comprehensive MRSA colonization surveillance program. It is not intended to diagnose MRSA infection nor to guide or monitor treatment for MRSA infections. Test performance is not FDA approved in patients less than 90 years old. Performed at [facility], [address] [place], [place] [address]          Radiology Studies: ECHOCARDIOGRAM COMPLETE  Result Date: 07/16/2023    ECHOCARDIOGRAM REPORT   Patient Name:   Ebony Stevens Date of Exam: 07/16/2023 Medical Rec #:  [id number]       Height:       61.5 in Accession #:    [id number]      Weight:       260.0 lb Date of Birth:  18-Dec-1950       BSA:          2.124 m Patient Age:    72 years        BP:           129/85 mmHg Patient Gender: F               HR:           52 bpm. [facility] Procedure: 2D Echo, Cardiac Doppler, Color Doppler and Intracardiac            Opacification Agent Indications:    CHF-Acute Diastolic I50.31  History:        Patient has prior history of Echocardiogram examinations, most                 recent 03/30/2023. CHF, Arrythmias:Bradycardia; Risk                 Factors:Hypertension, Diabetes and Dyslipidemia. Obesity, Class                 III,  Obstructive sleep apnea.  Sonographer:    Celesta Gentile RCS Referring Phys: [id number] OLADAPO ADEFESO  Sonographer Comments: Suboptimal subcostal window. IMPRESSIONS  1. Left ventricular ejection fraction, by estimation, is 60 to 65%. The left ventricle has normal function. The left ventricle has no regional wall motion abnormalities. Left ventricular diastolic parameters were normal.  2. Right ventricular systolic function is normal. The right ventricular size is normal.  3. Left atrial size was mildly dilated.  4. The mitral valve is normal in structure. No evidence of mitral valve regurgitation. No evidence of mitral stenosis.  5. The aortic valve is tricuspid. There is mild calcification of the aortic valve. There is mild thickening of the aortic valve. Aortic valve regurgitation is not visualized. Aortic valve sclerosis is present, with no evidence of aortic valve stenosis.  6. The inferior vena cava is normal in size with greater than 50% respiratory variability, suggesting right atrial pressure of 3 mmHg. FINDINGS  Left Ventricle: Left ventricular ejection fraction, by estimation, is 60 to 65%. The left ventricle has normal function. The left ventricle has no regional wall motion abnormalities. The left ventricular internal cavity size was normal in size. There is  no left ventricular hypertrophy. Left ventricular diastolic parameters were normal. Right Ventricle: The right ventricular size is normal. No increase in right ventricular wall thickness. Right ventricular systolic function is normal. Left Atrium: Left atrial size was mildly dilated. Right Atrium: Right atrial size was normal in size. Pericardium: There is no evidence of pericardial effusion. Mitral Valve: The mitral valve is normal in structure. No evidence  NOTE) The GeneXpert MRSA Assay (FDA approved for NASAL specimens only), is one component of a comprehensive MRSA colonization surveillance program. It is not intended to diagnose MRSA infection nor to guide or monitor treatment for MRSA infections. Test performance is not FDA approved in patients less than 90 years old. Performed at [facility], [address] [place], [place] [address]          Radiology Studies: ECHOCARDIOGRAM COMPLETE  Result Date: 07/16/2023    ECHOCARDIOGRAM REPORT   Patient Name:   Ebony Stevens Date of Exam: 07/16/2023 Medical Rec #:  [id number]       Height:       61.5 in Accession #:    [id number]      Weight:       260.0 lb Date of Birth:  18-Dec-1950       BSA:          2.124 m Patient Age:    72 years        BP:           129/85 mmHg Patient Gender: F               HR:           52 bpm. [facility] Procedure: 2D Echo, Cardiac Doppler, Color Doppler and Intracardiac            Opacification Agent Indications:    CHF-Acute Diastolic I50.31  History:        Patient has prior history of Echocardiogram examinations, most                 recent 03/30/2023. CHF, Arrythmias:Bradycardia; Risk                 Factors:Hypertension, Diabetes and Dyslipidemia. Obesity, Class                 III,  Obstructive sleep apnea.  Sonographer:    Celesta Gentile RCS Referring Phys: [id number] OLADAPO ADEFESO  Sonographer Comments: Suboptimal subcostal window. IMPRESSIONS  1. Left ventricular ejection fraction, by estimation, is 60 to 65%. The left ventricle has normal function. The left ventricle has no regional wall motion abnormalities. Left ventricular diastolic parameters were normal.  2. Right ventricular systolic function is normal. The right ventricular size is normal.  3. Left atrial size was mildly dilated.  4. The mitral valve is normal in structure. No evidence of mitral valve regurgitation. No evidence of mitral stenosis.  5. The aortic valve is tricuspid. There is mild calcification of the aortic valve. There is mild thickening of the aortic valve. Aortic valve regurgitation is not visualized. Aortic valve sclerosis is present, with no evidence of aortic valve stenosis.  6. The inferior vena cava is normal in size with greater than 50% respiratory variability, suggesting right atrial pressure of 3 mmHg. FINDINGS  Left Ventricle: Left ventricular ejection fraction, by estimation, is 60 to 65%. The left ventricle has normal function. The left ventricle has no regional wall motion abnormalities. The left ventricular internal cavity size was normal in size. There is  no left ventricular hypertrophy. Left ventricular diastolic parameters were normal. Right Ventricle: The right ventricular size is normal. No increase in right ventricular wall thickness. Right ventricular systolic function is normal. Left Atrium: Left atrial size was mildly dilated. Right Atrium: Right atrial size was normal in size. Pericardium: There is no evidence of pericardial effusion. Mitral Valve: The mitral valve is normal in structure. No evidence  NOTE) The GeneXpert MRSA Assay (FDA approved for NASAL specimens only), is one component of a comprehensive MRSA colonization surveillance program. It is not intended to diagnose MRSA infection nor to guide or monitor treatment for MRSA infections. Test performance is not FDA approved in patients less than 90 years old. Performed at [facility], [address] [place], [place] [address]          Radiology Studies: ECHOCARDIOGRAM COMPLETE  Result Date: 07/16/2023    ECHOCARDIOGRAM REPORT   Patient Name:   Ebony Stevens Date of Exam: 07/16/2023 Medical Rec #:  [id number]       Height:       61.5 in Accession #:    [id number]      Weight:       260.0 lb Date of Birth:  18-Dec-1950       BSA:          2.124 m Patient Age:    72 years        BP:           129/85 mmHg Patient Gender: F               HR:           52 bpm. [facility] Procedure: 2D Echo, Cardiac Doppler, Color Doppler and Intracardiac            Opacification Agent Indications:    CHF-Acute Diastolic I50.31  History:        Patient has prior history of Echocardiogram examinations, most                 recent 03/30/2023. CHF, Arrythmias:Bradycardia; Risk                 Factors:Hypertension, Diabetes and Dyslipidemia. Obesity, Class                 III,  Obstructive sleep apnea.  Sonographer:    Celesta Gentile RCS Referring Phys: [id number] OLADAPO ADEFESO  Sonographer Comments: Suboptimal subcostal window. IMPRESSIONS  1. Left ventricular ejection fraction, by estimation, is 60 to 65%. The left ventricle has normal function. The left ventricle has no regional wall motion abnormalities. Left ventricular diastolic parameters were normal.  2. Right ventricular systolic function is normal. The right ventricular size is normal.  3. Left atrial size was mildly dilated.  4. The mitral valve is normal in structure. No evidence of mitral valve regurgitation. No evidence of mitral stenosis.  5. The aortic valve is tricuspid. There is mild calcification of the aortic valve. There is mild thickening of the aortic valve. Aortic valve regurgitation is not visualized. Aortic valve sclerosis is present, with no evidence of aortic valve stenosis.  6. The inferior vena cava is normal in size with greater than 50% respiratory variability, suggesting right atrial pressure of 3 mmHg. FINDINGS  Left Ventricle: Left ventricular ejection fraction, by estimation, is 60 to 65%. The left ventricle has normal function. The left ventricle has no regional wall motion abnormalities. The left ventricular internal cavity size was normal in size. There is  no left ventricular hypertrophy. Left ventricular diastolic parameters were normal. Right Ventricle: The right ventricular size is normal. No increase in right ventricular wall thickness. Right ventricular systolic function is normal. Left Atrium: Left atrial size was mildly dilated. Right Atrium: Right atrial size was normal in size. Pericardium: There is no evidence of pericardial effusion. Mitral Valve: The mitral valve is normal in structure. No evidence  PROGRESS NOTE    Ebony Stevens  KXF:[id number] DOB: 05-Feb-1951 DOA: 07/15/2023 PCP: Benita Stabile, MD   Brief Narrative:    Ebony Stevens is a 72 y.o. female with medical history significant of cirrhosis, GERD, type 2 diabetes mellitus, asthma, hypertension, hyperlipidemia, sleep apnea who presents to the emergency department due to being asked to go to the ED by her PCP due to low levels of sodium noted on lab work.  Her spironolactone dose was recently increased by GI 2 days ago due to fluid retention.  She had a dose of tolvaptan on 10/4 with minimal improvement in sodium levels.  She appears to have a bland diet and will start sodium supplementation today.  Assessment & Plan:   Principal Problem:   Hyponatremia Active Problems:   GERD without esophagitis   Benign essential HTN   Diabetes mellitus without complication (HCC)   Mixed hyperlipidemia   Bradycardia   Acute on chronic diastolic CHF (congestive heart failure) (HCC)   Fall at home, initial encounter   Obstructive sleep apnea   Obesity, Class III, BMI 40-49.9 (morbid obesity) (HCC)  Assessment and Plan:   Hyponatremia-improving with sodium supplementation 1 L IV NS was initially provided IV Lasix 40 mg x 1 was given in the ED Continue to monitor sodium with serial BMPs TSH within normal limits and no sign of SIADH Patient states that she has been avoiding sodium and losing quite a bit of weight recently with Mounjaro Continue sodium supplementation   Acute on chronic diastolic CHF Continue total input/output, daily weights and fluid restriction She was treated with IV Lasix 40 mg x 1 ; discontinue IV Lasix and start tolvaptan Continue heart healthy/modified carb diet       2D echocardiogram with preserved LVEF 60-65%   Fall at home Continue fall precaution No need for PT services Noted to have fall overnight, continue close monitoring   Bradycardia This may be du-improved e to medication (metoprolol)  effect Consider dose reduction prior to discharge or discontinuation Continue telemetry   Essential hypertension Continue metoprolol at 25 mg daily Hold lisinopril   Mixed hyperlipidemia Continue Lipitor   GERD Continue Protonix   Obstructive sleep apnea Continue CPAP   Type 2 diabetes mellitus Continue Semglee 10 units nightly and adjust dose accordingly Continue ISS and hypoglycemia protocol Hold glipizide   Hepatic cirrhosis due to NASH Resume home oral Lasix and spironolactone Patient follows with [facility] gastroenterology   Morbid obesity (BMI 48.33) Patient was initially on Mounjaro, but this was stopped due to insurance issues Patient will need to follow-up with outpatient PCP for weight loss program   DVT prophylaxis:Lovenox Code Status: Full Family Communication: Spouse at bedside 10/5 Disposition Plan:  Status is: Inpatient Remains inpatient appropriate because: Need for IV medications.   Consultants:  Discussed with nephrology 10/4  Procedures:  None  Antimicrobials:  None   Subjective: Patient seen and evaluated today with minimal improvement in sodium levels noted overnight.  She states that she has been trying to avoid sodium in foods and losing weight recently.  She was noted to have some dizziness and a fall last night.  States that she is not eating very well and feels somewhat nauseous this morning.  Objective: Vitals:   07/17/23 2247 07/17/23 2300 07/17/23 2350 07/18/23 0600  BP: (!) 140/52   (!) 179/44  Pulse: (!) 55  64 63  Resp: 20  20 20   Temp:    (!) 97.5 F (

## 2023-07-19 DIAGNOSIS — E871 Hypo-osmolality and hyponatremia: Secondary | ICD-10-CM | POA: Diagnosis not present

## 2023-07-19 LAB — BASIC METABOLIC PANEL
Anion gap: 8 (ref 5–15)
BUN: 15 mg/dL (ref 8–23)
CO2: 23 mmol/L (ref 22–32)
Calcium: 8.7 mg/dL — ABNORMAL LOW (ref 8.9–10.3)
Chloride: 95 mmol/L — ABNORMAL LOW (ref 98–111)
Creatinine, Ser: 1.22 mg/dL — ABNORMAL HIGH (ref 0.44–1.00)
GFR, Estimated: 48 mL/min — ABNORMAL LOW (ref >60)
Glucose, Bld: 116 mg/dL — ABNORMAL HIGH (ref 70–99)
Potassium: 4.4 mmol/L (ref 3.5–5.1)
Sodium: 126 mmol/L — ABNORMAL LOW (ref 135–145)

## 2023-07-19 LAB — GLUCOSE, CAPILLARY
Glucose-Capillary: 155 mg/dL — ABNORMAL HIGH (ref 70–99)
Glucose-Capillary: 143 mg/dL — ABNORMAL HIGH (ref 70–99)

## 2023-07-19 LAB — MAGNESIUM: Magnesium: 1.7 mg/dL (ref 1.7–2.4)

## 2023-07-19 NOTE — Discharge Summary (Signed)
by mouth daily before breakfast.   insulin NPH-regular Human (70-30) 100 UNIT/ML injection Inject 60-70 Units into the skin at bedtime.   insulin regular 100 units/mL injection Commonly known as: NOVOLIN R Inject 10 Units into the skin 3 (three) times daily after meals.   lisinopril 40 MG tablet Commonly known as: ZESTRIL Take 40 mg by mouth daily.   loperamide 2 MG tablet Commonly known as: IMODIUM A-D Take 2 mg by mouth as needed for diarrhea or loose stools.   metFORMIN 500 MG 24 hr tablet Commonly known as: GLUCOPHAGE-XR Take 1,000 mg by mouth 2  (two) times daily.   metoprolol succinate 25 MG 24 hr tablet Commonly known as: TOPROL-XL Take 1 tablet (25 mg total) by mouth daily. What changed:  medication strength how much to take additional instructions   Mounjaro 5 MG/0.5ML Pen Generic drug: tirzepatide Inject 5 mg into the skin once a week.   pantoprazole 40 MG tablet Commonly known as: PROTONIX TAKE 1 TABLET EVERY DAY BEFORE BREAKFAST   sodium chloride 1 g tablet Take 1 tablet (1 g total) by mouth 3 (three) times daily with meals.   spironolactone 50 MG tablet Commonly known as: Aldactone Take 1 tablet (50 mg total) by mouth daily.        Follow-up Information     Benita Stabile, MD. Go in 2 day(s).   Specialty: Internal Medicine Contact information: [address] [place] [address] [phone]                No Known Allergies  Consultations: None   Procedures/Studies: ECHOCARDIOGRAM COMPLETE  Result Date: 07/16/2023    ECHOCARDIOGRAM REPORT   Patient Name:   Ebony Stevens Date of Exam: 07/16/2023 Medical Rec #:  [id number]       Height:       61.5 in Accession #:    [id number]      Weight:       260.0 lb Date of Birth:  12-03-1950       BSA:          2.124 m Patient Age:    72 years        BP:           129/85 mmHg Patient Gender: F               HR:           52 bpm. [facility] Procedure: 2D Echo, Cardiac Doppler, Color Doppler and Intracardiac            Opacification Agent Indications:    CHF-Acute Diastolic I50.31  History:        Patient has prior history of Echocardiogram examinations, most                 recent 03/30/2023. CHF, Arrythmias:Bradycardia; Risk                 Factors:Hypertension, Diabetes and Dyslipidemia. Obesity, Class                 III, Obstructive sleep apnea.  Sonographer:    Celesta Gentile RCS Referring Phys: [id number] OLADAPO ADEFESO  Sonographer Comments: Suboptimal subcostal window. IMPRESSIONS  1. Left ventricular ejection fraction, by estimation,  is 60 to 65%. The left ventricle has normal function. The left ventricle has no regional wall motion abnormalities. Left ventricular diastolic parameters were normal.  2. Right ventricular systolic function is normal. The right ventricular size  Physician Discharge Summary  Ebony Stevens ZOX:[id number] DOB: 06/07/1951 DOA: 07/15/2023  PCP: Benita Stabile, MD  Admit date: 07/15/2023  Discharge date: 07/19/2023  Admitted From:Home  Disposition:  Home  Recommendations for Outpatient Follow-up:  Follow up with PCP in 1-2 weeks Monitor BMP in 1 week to ensure sodium levels are improved Continue sodium chloride tablets as prescribed Continue other home medications as prior  Home Health: None  Equipment/Devices: None  Discharge Condition:Stable  CODE STATUS: Full  Diet recommendation: Carb Modified  Brief/Interim Summary:  Ebony Stevens is a 72 y.o. female with medical history significant of cirrhosis, GERD, type 2 diabetes mellitus, asthma, hypertension, hyperlipidemia, sleep apnea who presents to the emergency department due to being asked to go to the ED by her PCP due to low levels of sodium noted on lab work.  Her spironolactone dose was recently increased by GI 2 days ago due to fluid retention.  She initially had a dose of tolvaptan and with little to no change in her sodium levels.  It was then discovered that she has been restricting her sodium intake and has been losing quite a bit of weight on Mounjaro and has not been eating very much.  It appears that she was on a fairly salt restricted diet and therefore was started on sodium chloride tablets with improvement in her sodium levels.  She is up to sodium level of 126 on the day of discharge and has no further symptomatic complaints or concerns and is in stable condition for discharge.  Discharge Diagnoses:  Principal Problem:   Hyponatremia Active Problems:   GERD without esophagitis   Benign essential HTN   Diabetes mellitus without complication (HCC)   Mixed hyperlipidemia   Bradycardia   Acute on chronic diastolic CHF (congestive heart failure) (HCC)   Fall at home, initial encounter   Obstructive sleep apnea   Obesity, Class III, BMI 40-49.9 (morbid obesity)  (HCC)  Principal discharge diagnosis: Hyponatremia in the setting of sodium depletion.  Discharge Instructions  Discharge Instructions     Diet - low sodium heart healthy   Complete by: As directed    Diet - low sodium heart healthy   Complete by: As directed    Increase activity slowly   Complete by: As directed    Increase activity slowly   Complete by: As directed       Allergies as of 07/19/2023   No Known Allergies      Medication List     TAKE these medications    acetaminophen 500 MG tablet Commonly known as: TYLENOL Take 500 mg by mouth every 6 (six) hours as needed for moderate pain.   albuterol 108 (90 Base) MCG/ACT inhaler Commonly known as: VENTOLIN HFA Inhale 1-2 puffs into the lungs every 6 (six) hours as needed for wheezing or shortness of breath.   aspirin EC 325 MG tablet Take 325 mg by mouth daily.   atorvastatin 40 MG tablet Commonly known as: LIPITOR Take 40 mg by mouth daily.   CALTRATE 600+D PO Take 1 tablet by mouth daily.   cetirizine 10 MG tablet Commonly known as: ZYRTEC Take 10 mg by mouth daily.   estradiol 1 MG tablet Commonly known as: ESTRACE Take 1 tablet by mouth at bedtime.   furosemide 40 MG tablet Commonly known as: LASIX TAKE ONE TABLET BY MOUTH EVERY [name] What changed: when to take this   glipiZIDE 10 MG tablet Commonly known as: GLUCOTROL Take 10 mg  by mouth daily before breakfast.   insulin NPH-regular Human (70-30) 100 UNIT/ML injection Inject 60-70 Units into the skin at bedtime.   insulin regular 100 units/mL injection Commonly known as: NOVOLIN R Inject 10 Units into the skin 3 (three) times daily after meals.   lisinopril 40 MG tablet Commonly known as: ZESTRIL Take 40 mg by mouth daily.   loperamide 2 MG tablet Commonly known as: IMODIUM A-D Take 2 mg by mouth as needed for diarrhea or loose stools.   metFORMIN 500 MG 24 hr tablet Commonly known as: GLUCOPHAGE-XR Take 1,000 mg by mouth 2  (two) times daily.   metoprolol succinate 25 MG 24 hr tablet Commonly known as: TOPROL-XL Take 1 tablet (25 mg total) by mouth daily. What changed:  medication strength how much to take additional instructions   Mounjaro 5 MG/0.5ML Pen Generic drug: tirzepatide Inject 5 mg into the skin once a week.   pantoprazole 40 MG tablet Commonly known as: PROTONIX TAKE 1 TABLET EVERY DAY BEFORE BREAKFAST   sodium chloride 1 g tablet Take 1 tablet (1 g total) by mouth 3 (three) times daily with meals.   spironolactone 50 MG tablet Commonly known as: Aldactone Take 1 tablet (50 mg total) by mouth daily.        Follow-up Information     Benita Stabile, MD. Go in 2 day(s).   Specialty: Internal Medicine Contact information: [address] [place] [address] [phone]                No Known Allergies  Consultations: None   Procedures/Studies: ECHOCARDIOGRAM COMPLETE  Result Date: 07/16/2023    ECHOCARDIOGRAM REPORT   Patient Name:   Ebony Stevens Date of Exam: 07/16/2023 Medical Rec #:  [id number]       Height:       61.5 in Accession #:    [id number]      Weight:       260.0 lb Date of Birth:  12-03-1950       BSA:          2.124 m Patient Age:    72 years        BP:           129/85 mmHg Patient Gender: F               HR:           52 bpm. [facility] Procedure: 2D Echo, Cardiac Doppler, Color Doppler and Intracardiac            Opacification Agent Indications:    CHF-Acute Diastolic I50.31  History:        Patient has prior history of Echocardiogram examinations, most                 recent 03/30/2023. CHF, Arrythmias:Bradycardia; Risk                 Factors:Hypertension, Diabetes and Dyslipidemia. Obesity, Class                 III, Obstructive sleep apnea.  Sonographer:    Celesta Gentile RCS Referring Phys: [id number] OLADAPO ADEFESO  Sonographer Comments: Suboptimal subcostal window. IMPRESSIONS  1. Left ventricular ejection fraction, by estimation,  is 60 to 65%. The left ventricle has normal function. The left ventricle has no regional wall motion abnormalities. Left ventricular diastolic parameters were normal.  2. Right ventricular systolic function is normal. The right ventricular size  Physician Discharge Summary  Ebony Stevens ZOX:[id number] DOB: 06/07/1951 DOA: 07/15/2023  PCP: Benita Stabile, MD  Admit date: 07/15/2023  Discharge date: 07/19/2023  Admitted From:Home  Disposition:  Home  Recommendations for Outpatient Follow-up:  Follow up with PCP in 1-2 weeks Monitor BMP in 1 week to ensure sodium levels are improved Continue sodium chloride tablets as prescribed Continue other home medications as prior  Home Health: None  Equipment/Devices: None  Discharge Condition:Stable  CODE STATUS: Full  Diet recommendation: Carb Modified  Brief/Interim Summary:  Ebony Stevens is a 72 y.o. female with medical history significant of cirrhosis, GERD, type 2 diabetes mellitus, asthma, hypertension, hyperlipidemia, sleep apnea who presents to the emergency department due to being asked to go to the ED by her PCP due to low levels of sodium noted on lab work.  Her spironolactone dose was recently increased by GI 2 days ago due to fluid retention.  She initially had a dose of tolvaptan and with little to no change in her sodium levels.  It was then discovered that she has been restricting her sodium intake and has been losing quite a bit of weight on Mounjaro and has not been eating very much.  It appears that she was on a fairly salt restricted diet and therefore was started on sodium chloride tablets with improvement in her sodium levels.  She is up to sodium level of 126 on the day of discharge and has no further symptomatic complaints or concerns and is in stable condition for discharge.  Discharge Diagnoses:  Principal Problem:   Hyponatremia Active Problems:   GERD without esophagitis   Benign essential HTN   Diabetes mellitus without complication (HCC)   Mixed hyperlipidemia   Bradycardia   Acute on chronic diastolic CHF (congestive heart failure) (HCC)   Fall at home, initial encounter   Obstructive sleep apnea   Obesity, Class III, BMI 40-49.9 (morbid obesity)  (HCC)  Principal discharge diagnosis: Hyponatremia in the setting of sodium depletion.  Discharge Instructions  Discharge Instructions     Diet - low sodium heart healthy   Complete by: As directed    Diet - low sodium heart healthy   Complete by: As directed    Increase activity slowly   Complete by: As directed    Increase activity slowly   Complete by: As directed       Allergies as of 07/19/2023   No Known Allergies      Medication List     TAKE these medications    acetaminophen 500 MG tablet Commonly known as: TYLENOL Take 500 mg by mouth every 6 (six) hours as needed for moderate pain.   albuterol 108 (90 Base) MCG/ACT inhaler Commonly known as: VENTOLIN HFA Inhale 1-2 puffs into the lungs every 6 (six) hours as needed for wheezing or shortness of breath.   aspirin EC 325 MG tablet Take 325 mg by mouth daily.   atorvastatin 40 MG tablet Commonly known as: LIPITOR Take 40 mg by mouth daily.   CALTRATE 600+D PO Take 1 tablet by mouth daily.   cetirizine 10 MG tablet Commonly known as: ZYRTEC Take 10 mg by mouth daily.   estradiol 1 MG tablet Commonly known as: ESTRACE Take 1 tablet by mouth at bedtime.   furosemide 40 MG tablet Commonly known as: LASIX TAKE ONE TABLET BY MOUTH EVERY [name] What changed: when to take this   glipiZIDE 10 MG tablet Commonly known as: GLUCOTROL Take 10 mg  by mouth daily before breakfast.   insulin NPH-regular Human (70-30) 100 UNIT/ML injection Inject 60-70 Units into the skin at bedtime.   insulin regular 100 units/mL injection Commonly known as: NOVOLIN R Inject 10 Units into the skin 3 (three) times daily after meals.   lisinopril 40 MG tablet Commonly known as: ZESTRIL Take 40 mg by mouth daily.   loperamide 2 MG tablet Commonly known as: IMODIUM A-D Take 2 mg by mouth as needed for diarrhea or loose stools.   metFORMIN 500 MG 24 hr tablet Commonly known as: GLUCOPHAGE-XR Take 1,000 mg by mouth 2  (two) times daily.   metoprolol succinate 25 MG 24 hr tablet Commonly known as: TOPROL-XL Take 1 tablet (25 mg total) by mouth daily. What changed:  medication strength how much to take additional instructions   Mounjaro 5 MG/0.5ML Pen Generic drug: tirzepatide Inject 5 mg into the skin once a week.   pantoprazole 40 MG tablet Commonly known as: PROTONIX TAKE 1 TABLET EVERY DAY BEFORE BREAKFAST   sodium chloride 1 g tablet Take 1 tablet (1 g total) by mouth 3 (three) times daily with meals.   spironolactone 50 MG tablet Commonly known as: Aldactone Take 1 tablet (50 mg total) by mouth daily.        Follow-up Information     Benita Stabile, MD. Go in 2 day(s).   Specialty: Internal Medicine Contact information: [address] [place] [address] [phone]                No Known Allergies  Consultations: None   Procedures/Studies: ECHOCARDIOGRAM COMPLETE  Result Date: 07/16/2023    ECHOCARDIOGRAM REPORT   Patient Name:   Ebony Stevens Date of Exam: 07/16/2023 Medical Rec #:  [id number]       Height:       61.5 in Accession #:    [id number]      Weight:       260.0 lb Date of Birth:  12-03-1950       BSA:          2.124 m Patient Age:    72 years        BP:           129/85 mmHg Patient Gender: F               HR:           52 bpm. [facility] Procedure: 2D Echo, Cardiac Doppler, Color Doppler and Intracardiac            Opacification Agent Indications:    CHF-Acute Diastolic I50.31  History:        Patient has prior history of Echocardiogram examinations, most                 recent 03/30/2023. CHF, Arrythmias:Bradycardia; Risk                 Factors:Hypertension, Diabetes and Dyslipidemia. Obesity, Class                 III, Obstructive sleep apnea.  Sonographer:    Celesta Gentile RCS Referring Phys: [id number] OLADAPO ADEFESO  Sonographer Comments: Suboptimal subcostal window. IMPRESSIONS  1. Left ventricular ejection fraction, by estimation,  is 60 to 65%. The left ventricle has normal function. The left ventricle has no regional wall motion abnormalities. Left ventricular diastolic parameters were normal.  2. Right ventricular systolic function is normal. The right ventricular size  Physician Discharge Summary  Ebony Stevens ZOX:[id number] DOB: 06/07/1951 DOA: 07/15/2023  PCP: Benita Stabile, MD  Admit date: 07/15/2023  Discharge date: 07/19/2023  Admitted From:Home  Disposition:  Home  Recommendations for Outpatient Follow-up:  Follow up with PCP in 1-2 weeks Monitor BMP in 1 week to ensure sodium levels are improved Continue sodium chloride tablets as prescribed Continue other home medications as prior  Home Health: None  Equipment/Devices: None  Discharge Condition:Stable  CODE STATUS: Full  Diet recommendation: Carb Modified  Brief/Interim Summary:  Ebony Stevens is a 72 y.o. female with medical history significant of cirrhosis, GERD, type 2 diabetes mellitus, asthma, hypertension, hyperlipidemia, sleep apnea who presents to the emergency department due to being asked to go to the ED by her PCP due to low levels of sodium noted on lab work.  Her spironolactone dose was recently increased by GI 2 days ago due to fluid retention.  She initially had a dose of tolvaptan and with little to no change in her sodium levels.  It was then discovered that she has been restricting her sodium intake and has been losing quite a bit of weight on Mounjaro and has not been eating very much.  It appears that she was on a fairly salt restricted diet and therefore was started on sodium chloride tablets with improvement in her sodium levels.  She is up to sodium level of 126 on the day of discharge and has no further symptomatic complaints or concerns and is in stable condition for discharge.  Discharge Diagnoses:  Principal Problem:   Hyponatremia Active Problems:   GERD without esophagitis   Benign essential HTN   Diabetes mellitus without complication (HCC)   Mixed hyperlipidemia   Bradycardia   Acute on chronic diastolic CHF (congestive heart failure) (HCC)   Fall at home, initial encounter   Obstructive sleep apnea   Obesity, Class III, BMI 40-49.9 (morbid obesity)  (HCC)  Principal discharge diagnosis: Hyponatremia in the setting of sodium depletion.  Discharge Instructions  Discharge Instructions     Diet - low sodium heart healthy   Complete by: As directed    Diet - low sodium heart healthy   Complete by: As directed    Increase activity slowly   Complete by: As directed    Increase activity slowly   Complete by: As directed       Allergies as of 07/19/2023   No Known Allergies      Medication List     TAKE these medications    acetaminophen 500 MG tablet Commonly known as: TYLENOL Take 500 mg by mouth every 6 (six) hours as needed for moderate pain.   albuterol 108 (90 Base) MCG/ACT inhaler Commonly known as: VENTOLIN HFA Inhale 1-2 puffs into the lungs every 6 (six) hours as needed for wheezing or shortness of breath.   aspirin EC 325 MG tablet Take 325 mg by mouth daily.   atorvastatin 40 MG tablet Commonly known as: LIPITOR Take 40 mg by mouth daily.   CALTRATE 600+D PO Take 1 tablet by mouth daily.   cetirizine 10 MG tablet Commonly known as: ZYRTEC Take 10 mg by mouth daily.   estradiol 1 MG tablet Commonly known as: ESTRACE Take 1 tablet by mouth at bedtime.   furosemide 40 MG tablet Commonly known as: LASIX TAKE ONE TABLET BY MOUTH EVERY [name] What changed: when to take this   glipiZIDE 10 MG tablet Commonly known as: GLUCOTROL Take 10 mg

## 2023-07-19 NOTE — Discharge Summary (Signed)
[name] What changed: when to take this   insulin NPH-regular Human (70-30) 100 UNIT/ML injection Inject 60-70 Units into the skin at bedtime.   insulin regular 100 units/mL injection Commonly known as: NOVOLIN R Inject 10 Units into the skin 3 (three) times daily after meals.   lisinopril 40 MG tablet Commonly known as: ZESTRIL Take 40 mg by mouth daily.   loperamide 2 MG tablet Commonly known as: IMODIUM A-D Take 2 mg by mouth as needed for diarrhea or loose stools.   metFORMIN 500 MG 24 hr tablet Commonly known as: GLUCOPHAGE-XR Take 1,000 mg by mouth 2  (two) times daily.   metoprolol succinate 100 MG 24 hr tablet Commonly known as: TOPROL-XL Take 50 mg by mouth daily. Take with or immediately following a meal. What changed: Another medication with the same name was added. Make sure you understand how and when to take each.   metoprolol succinate 25 MG 24 hr tablet Commonly known as: TOPROL-XL Take 1 tablet (25 mg total) by mouth daily. What changed: You were already taking a medication with the same name, and this prescription was added. Make sure you understand how and when to take each.   Mounjaro 5 MG/0.5ML Pen Generic drug: tirzepatide Inject 5 mg into the skin once a week.   pantoprazole 40 MG tablet Commonly known as: PROTONIX TAKE 1 TABLET EVERY DAY BEFORE BREAKFAST   sodium chloride 1 g tablet Take 1 tablet (1 g total) by mouth 3 (three) times daily with meals.   spironolactone 50 MG tablet Commonly known as: Aldactone Take 1 tablet (50 mg total) by mouth daily.        Follow-up Information     Benita Stabile, MD. Go in 2 day(s).   Specialty: Internal Medicine Contact information: [address] [place] [address] [phone]                No Known Allergies  Consultations: None   Procedures/Studies: ECHOCARDIOGRAM COMPLETE  Result Date: 07/16/2023    ECHOCARDIOGRAM REPORT   Patient Name:   Ebony Stevens Date of Exam: 07/16/2023 Medical Rec #:  [id number]       Height:       61.5 in Accession #:    [id number]      Weight:       260.0 lb Date of Birth:  05-16-1951       BSA:          2.124 m Patient Age:    72 years        BP:           129/85 mmHg Patient Gender: F               HR:           52 bpm. [facility] Procedure: 2D Echo, Cardiac Doppler, Color Doppler and Intracardiac            Opacification Agent Indications:    CHF-Acute Diastolic I50.31  History:        Patient has prior history of Echocardiogram examinations, most                 recent 03/30/2023. CHF,  Arrythmias:Bradycardia; Risk                 Factors:Hypertension, Diabetes and Dyslipidemia. Obesity, Class                 III, Obstructive sleep apnea.  [name] What changed: when to take this   insulin NPH-regular Human (70-30) 100 UNIT/ML injection Inject 60-70 Units into the skin at bedtime.   insulin regular 100 units/mL injection Commonly known as: NOVOLIN R Inject 10 Units into the skin 3 (three) times daily after meals.   lisinopril 40 MG tablet Commonly known as: ZESTRIL Take 40 mg by mouth daily.   loperamide 2 MG tablet Commonly known as: IMODIUM A-D Take 2 mg by mouth as needed for diarrhea or loose stools.   metFORMIN 500 MG 24 hr tablet Commonly known as: GLUCOPHAGE-XR Take 1,000 mg by mouth 2  (two) times daily.   metoprolol succinate 100 MG 24 hr tablet Commonly known as: TOPROL-XL Take 50 mg by mouth daily. Take with or immediately following a meal. What changed: Another medication with the same name was added. Make sure you understand how and when to take each.   metoprolol succinate 25 MG 24 hr tablet Commonly known as: TOPROL-XL Take 1 tablet (25 mg total) by mouth daily. What changed: You were already taking a medication with the same name, and this prescription was added. Make sure you understand how and when to take each.   Mounjaro 5 MG/0.5ML Pen Generic drug: tirzepatide Inject 5 mg into the skin once a week.   pantoprazole 40 MG tablet Commonly known as: PROTONIX TAKE 1 TABLET EVERY DAY BEFORE BREAKFAST   sodium chloride 1 g tablet Take 1 tablet (1 g total) by mouth 3 (three) times daily with meals.   spironolactone 50 MG tablet Commonly known as: Aldactone Take 1 tablet (50 mg total) by mouth daily.        Follow-up Information     Benita Stabile, MD. Go in 2 day(s).   Specialty: Internal Medicine Contact information: [address] [place] [address] [phone]                No Known Allergies  Consultations: None   Procedures/Studies: ECHOCARDIOGRAM COMPLETE  Result Date: 07/16/2023    ECHOCARDIOGRAM REPORT   Patient Name:   Ebony Stevens Date of Exam: 07/16/2023 Medical Rec #:  [id number]       Height:       61.5 in Accession #:    [id number]      Weight:       260.0 lb Date of Birth:  05-16-1951       BSA:          2.124 m Patient Age:    72 years        BP:           129/85 mmHg Patient Gender: F               HR:           52 bpm. [facility] Procedure: 2D Echo, Cardiac Doppler, Color Doppler and Intracardiac            Opacification Agent Indications:    CHF-Acute Diastolic I50.31  History:        Patient has prior history of Echocardiogram examinations, most                 recent 03/30/2023. CHF,  Arrythmias:Bradycardia; Risk                 Factors:Hypertension, Diabetes and Dyslipidemia. Obesity, Class                 III, Obstructive sleep apnea.  Physician Discharge Summary  [name]:[id number] DOB: 1950/12/02 DOA: 07/15/2023  PCP: Benita Stabile, MD  Admit date: 07/15/2023  Discharge date: 07/19/2023  Admitted From:Home  Disposition:  Home  Recommendations for Outpatient Follow-up:  Follow up with PCP in 1-2 weeks Monitor BMP in 1 week to ensure sodium levels are improved Continue sodium chloride tablets as prescribed Continue other home medications as prior  Home Health: None  Equipment/Devices: None  Discharge Condition:Stable  CODE STATUS: Full  Diet recommendation: Carb Modified  Brief/Interim Summary:  Ebony Stevens is a 72 y.o. female with medical history significant of cirrhosis, GERD, type 2 diabetes mellitus, asthma, hypertension, hyperlipidemia, sleep apnea who presents to the emergency department due to being asked to go to the ED by her PCP due to low levels of sodium noted on lab work.  Her spironolactone dose was recently increased by GI 2 days ago due to fluid retention.  She initially had a dose of tolvaptan and with little to no change in her sodium levels.  It was then discovered that she has been restricting her sodium intake and has been losing quite a bit of weight on Mounjaro and has not been eating very much.  It appears that she was on a fairly salt restricted diet and therefore was started on sodium chloride tablets with improvement in her sodium levels.  She is up to sodium level of 126 on the day of discharge and has no further symptomatic complaints or concerns and is in stable condition for discharge.  Discharge Diagnoses:  Principal Problem:   Hyponatremia Active Problems:   GERD without esophagitis   Benign essential HTN   Diabetes mellitus without complication (HCC)   Mixed hyperlipidemia   Bradycardia   Acute on chronic diastolic CHF (congestive heart failure) (HCC)   Fall at home, initial encounter   Obstructive sleep apnea   Obesity, Class III, BMI 40-49.9 (morbid obesity)  (HCC)  Principal discharge diagnosis: Hyponatremia in the setting of sodium depletion.  Discharge Instructions  Discharge Instructions     Diet - low sodium heart healthy   Complete by: As directed    Diet - low sodium heart healthy   Complete by: As directed    Increase activity slowly   Complete by: As directed    Increase activity slowly   Complete by: As directed       Allergies as of 07/19/2023   No Known Allergies      Medication List     STOP taking these medications    glipiZIDE 10 MG tablet Commonly known as: GLUCOTROL       TAKE these medications    acetaminophen 500 MG tablet Commonly known as: TYLENOL Take 500 mg by mouth every 6 (six) hours as needed for moderate pain.   albuterol 108 (90 Base) MCG/ACT inhaler Commonly known as: VENTOLIN HFA Inhale 1-2 puffs into the lungs every 6 (six) hours as needed for wheezing or shortness of breath.   aspirin EC 325 MG tablet Take 325 mg by mouth daily.   atorvastatin 40 MG tablet Commonly known as: LIPITOR Take 40 mg by mouth daily.   CALTRATE 600+D PO Take 1 tablet by mouth daily.   cetirizine 10 MG tablet Commonly known as: ZYRTEC Take 10 mg by mouth daily.   estradiol 1 MG tablet Commonly known as: ESTRACE Take 1 tablet by mouth at bedtime.   furosemide 40 MG tablet Commonly known as: LASIX TAKE ONE TABLET BY MOUTH EVERY  Physician Discharge Summary  [name]:[id number] DOB: 1950/12/02 DOA: 07/15/2023  PCP: Benita Stabile, MD  Admit date: 07/15/2023  Discharge date: 07/19/2023  Admitted From:Home  Disposition:  Home  Recommendations for Outpatient Follow-up:  Follow up with PCP in 1-2 weeks Monitor BMP in 1 week to ensure sodium levels are improved Continue sodium chloride tablets as prescribed Continue other home medications as prior  Home Health: None  Equipment/Devices: None  Discharge Condition:Stable  CODE STATUS: Full  Diet recommendation: Carb Modified  Brief/Interim Summary:  Ebony Stevens is a 72 y.o. female with medical history significant of cirrhosis, GERD, type 2 diabetes mellitus, asthma, hypertension, hyperlipidemia, sleep apnea who presents to the emergency department due to being asked to go to the ED by her PCP due to low levels of sodium noted on lab work.  Her spironolactone dose was recently increased by GI 2 days ago due to fluid retention.  She initially had a dose of tolvaptan and with little to no change in her sodium levels.  It was then discovered that she has been restricting her sodium intake and has been losing quite a bit of weight on Mounjaro and has not been eating very much.  It appears that she was on a fairly salt restricted diet and therefore was started on sodium chloride tablets with improvement in her sodium levels.  She is up to sodium level of 126 on the day of discharge and has no further symptomatic complaints or concerns and is in stable condition for discharge.  Discharge Diagnoses:  Principal Problem:   Hyponatremia Active Problems:   GERD without esophagitis   Benign essential HTN   Diabetes mellitus without complication (HCC)   Mixed hyperlipidemia   Bradycardia   Acute on chronic diastolic CHF (congestive heart failure) (HCC)   Fall at home, initial encounter   Obstructive sleep apnea   Obesity, Class III, BMI 40-49.9 (morbid obesity)  (HCC)  Principal discharge diagnosis: Hyponatremia in the setting of sodium depletion.  Discharge Instructions  Discharge Instructions     Diet - low sodium heart healthy   Complete by: As directed    Diet - low sodium heart healthy   Complete by: As directed    Increase activity slowly   Complete by: As directed    Increase activity slowly   Complete by: As directed       Allergies as of 07/19/2023   No Known Allergies      Medication List     STOP taking these medications    glipiZIDE 10 MG tablet Commonly known as: GLUCOTROL       TAKE these medications    acetaminophen 500 MG tablet Commonly known as: TYLENOL Take 500 mg by mouth every 6 (six) hours as needed for moderate pain.   albuterol 108 (90 Base) MCG/ACT inhaler Commonly known as: VENTOLIN HFA Inhale 1-2 puffs into the lungs every 6 (six) hours as needed for wheezing or shortness of breath.   aspirin EC 325 MG tablet Take 325 mg by mouth daily.   atorvastatin 40 MG tablet Commonly known as: LIPITOR Take 40 mg by mouth daily.   CALTRATE 600+D PO Take 1 tablet by mouth daily.   cetirizine 10 MG tablet Commonly known as: ZYRTEC Take 10 mg by mouth daily.   estradiol 1 MG tablet Commonly known as: ESTRACE Take 1 tablet by mouth at bedtime.   furosemide 40 MG tablet Commonly known as: LASIX TAKE ONE TABLET BY MOUTH EVERY  [name] What changed: when to take this   insulin NPH-regular Human (70-30) 100 UNIT/ML injection Inject 60-70 Units into the skin at bedtime.   insulin regular 100 units/mL injection Commonly known as: NOVOLIN R Inject 10 Units into the skin 3 (three) times daily after meals.   lisinopril 40 MG tablet Commonly known as: ZESTRIL Take 40 mg by mouth daily.   loperamide 2 MG tablet Commonly known as: IMODIUM A-D Take 2 mg by mouth as needed for diarrhea or loose stools.   metFORMIN 500 MG 24 hr tablet Commonly known as: GLUCOPHAGE-XR Take 1,000 mg by mouth 2  (two) times daily.   metoprolol succinate 100 MG 24 hr tablet Commonly known as: TOPROL-XL Take 50 mg by mouth daily. Take with or immediately following a meal. What changed: Another medication with the same name was added. Make sure you understand how and when to take each.   metoprolol succinate 25 MG 24 hr tablet Commonly known as: TOPROL-XL Take 1 tablet (25 mg total) by mouth daily. What changed: You were already taking a medication with the same name, and this prescription was added. Make sure you understand how and when to take each.   Mounjaro 5 MG/0.5ML Pen Generic drug: tirzepatide Inject 5 mg into the skin once a week.   pantoprazole 40 MG tablet Commonly known as: PROTONIX TAKE 1 TABLET EVERY DAY BEFORE BREAKFAST   sodium chloride 1 g tablet Take 1 tablet (1 g total) by mouth 3 (three) times daily with meals.   spironolactone 50 MG tablet Commonly known as: Aldactone Take 1 tablet (50 mg total) by mouth daily.        Follow-up Information     Benita Stabile, MD. Go in 2 day(s).   Specialty: Internal Medicine Contact information: [address] [place] [address] [phone]                No Known Allergies  Consultations: None   Procedures/Studies: ECHOCARDIOGRAM COMPLETE  Result Date: 07/16/2023    ECHOCARDIOGRAM REPORT   Patient Name:   Ebony Stevens Date of Exam: 07/16/2023 Medical Rec #:  [id number]       Height:       61.5 in Accession #:    [id number]      Weight:       260.0 lb Date of Birth:  05-16-1951       BSA:          2.124 m Patient Age:    72 years        BP:           129/85 mmHg Patient Gender: F               HR:           52 bpm. [facility] Procedure: 2D Echo, Cardiac Doppler, Color Doppler and Intracardiac            Opacification Agent Indications:    CHF-Acute Diastolic I50.31  History:        Patient has prior history of Echocardiogram examinations, most                 recent 03/30/2023. CHF,  Arrythmias:Bradycardia; Risk                 Factors:Hypertension, Diabetes and Dyslipidemia. Obesity, Class                 III, Obstructive sleep apnea.  Physician Discharge Summary  [name]:[id number] DOB: 1950/12/02 DOA: 07/15/2023  PCP: Benita Stabile, MD  Admit date: 07/15/2023  Discharge date: 07/19/2023  Admitted From:Home  Disposition:  Home  Recommendations for Outpatient Follow-up:  Follow up with PCP in 1-2 weeks Monitor BMP in 1 week to ensure sodium levels are improved Continue sodium chloride tablets as prescribed Continue other home medications as prior  Home Health: None  Equipment/Devices: None  Discharge Condition:Stable  CODE STATUS: Full  Diet recommendation: Carb Modified  Brief/Interim Summary:  Ebony Stevens is a 72 y.o. female with medical history significant of cirrhosis, GERD, type 2 diabetes mellitus, asthma, hypertension, hyperlipidemia, sleep apnea who presents to the emergency department due to being asked to go to the ED by her PCP due to low levels of sodium noted on lab work.  Her spironolactone dose was recently increased by GI 2 days ago due to fluid retention.  She initially had a dose of tolvaptan and with little to no change in her sodium levels.  It was then discovered that she has been restricting her sodium intake and has been losing quite a bit of weight on Mounjaro and has not been eating very much.  It appears that she was on a fairly salt restricted diet and therefore was started on sodium chloride tablets with improvement in her sodium levels.  She is up to sodium level of 126 on the day of discharge and has no further symptomatic complaints or concerns and is in stable condition for discharge.  Discharge Diagnoses:  Principal Problem:   Hyponatremia Active Problems:   GERD without esophagitis   Benign essential HTN   Diabetes mellitus without complication (HCC)   Mixed hyperlipidemia   Bradycardia   Acute on chronic diastolic CHF (congestive heart failure) (HCC)   Fall at home, initial encounter   Obstructive sleep apnea   Obesity, Class III, BMI 40-49.9 (morbid obesity)  (HCC)  Principal discharge diagnosis: Hyponatremia in the setting of sodium depletion.  Discharge Instructions  Discharge Instructions     Diet - low sodium heart healthy   Complete by: As directed    Diet - low sodium heart healthy   Complete by: As directed    Increase activity slowly   Complete by: As directed    Increase activity slowly   Complete by: As directed       Allergies as of 07/19/2023   No Known Allergies      Medication List     STOP taking these medications    glipiZIDE 10 MG tablet Commonly known as: GLUCOTROL       TAKE these medications    acetaminophen 500 MG tablet Commonly known as: TYLENOL Take 500 mg by mouth every 6 (six) hours as needed for moderate pain.   albuterol 108 (90 Base) MCG/ACT inhaler Commonly known as: VENTOLIN HFA Inhale 1-2 puffs into the lungs every 6 (six) hours as needed for wheezing or shortness of breath.   aspirin EC 325 MG tablet Take 325 mg by mouth daily.   atorvastatin 40 MG tablet Commonly known as: LIPITOR Take 40 mg by mouth daily.   CALTRATE 600+D PO Take 1 tablet by mouth daily.   cetirizine 10 MG tablet Commonly known as: ZYRTEC Take 10 mg by mouth daily.   estradiol 1 MG tablet Commonly known as: ESTRACE Take 1 tablet by mouth at bedtime.   furosemide 40 MG tablet Commonly known as: LASIX TAKE ONE TABLET BY MOUTH EVERY

## 2023-07-19 NOTE — Plan of Care (Signed)

## 2023-07-20 DIAGNOSIS — R600 Localized edema: Secondary | ICD-10-CM | POA: Diagnosis not present

## 2023-07-20 DIAGNOSIS — E871 Hypo-osmolality and hyponatremia: Secondary | ICD-10-CM | POA: Diagnosis not present

## 2023-07-20 DIAGNOSIS — E782 Mixed hyperlipidemia: Secondary | ICD-10-CM | POA: Diagnosis not present

## 2023-07-20 DIAGNOSIS — I129 Hypertensive chronic kidney disease with stage 1 through stage 4 chronic kidney disease, or unspecified chronic kidney disease: Secondary | ICD-10-CM | POA: Diagnosis not present

## 2023-07-20 DIAGNOSIS — M545 Low back pain, unspecified: Secondary | ICD-10-CM | POA: Diagnosis not present

## 2023-07-20 DIAGNOSIS — G8929 Other chronic pain: Secondary | ICD-10-CM | POA: Diagnosis not present

## 2023-07-20 DIAGNOSIS — I130 Hypertensive heart and chronic kidney disease with heart failure and stage 1 through stage 4 chronic kidney disease, or unspecified chronic kidney disease: Secondary | ICD-10-CM | POA: Diagnosis not present

## 2023-07-20 DIAGNOSIS — F32A Depression, unspecified: Secondary | ICD-10-CM | POA: Diagnosis not present

## 2023-07-20 DIAGNOSIS — R800 Isolated proteinuria: Secondary | ICD-10-CM | POA: Diagnosis not present

## 2023-07-20 DIAGNOSIS — E1165 Type 2 diabetes mellitus with hyperglycemia: Secondary | ICD-10-CM | POA: Diagnosis not present

## 2023-07-26 ENCOUNTER — Telehealth: Payer: Self-pay | Admitting: *Deleted

## 2023-07-26 NOTE — Progress Notes (Signed)
Care Coordination   Note   07/26/2023 Name: GEMA ACOMB MRN: [id number] DOB: [birth year]  HELAYNA BLOYER is a 72 y.o. year old female who sees Margo Aye, Kathleene Hazel, MD for primary care. I reached out to Tretha Sciara by phone today to offer care coordination services.  Ms. Flager was given information about Care Coordination services today including:   The Care Coordination services include support from the care team which includes your Nurse Coordinator, Clinical Social Worker, or Pharmacist.  The Care Coordination team is here to help remove barriers to the health concerns and goals most important to you. Care Coordination services are voluntary, and the patient may decline or stop services at any time by request to their care team member.   Care Coordination Consent Status: Patient agreed to services and verbal consent obtained.   Follow up plan:  Telephone appointment with care coordination team member scheduled for:  08/05/23  Encounter Outcome:  Patient Scheduled [facility] Coordination Care Guide  Direct Dial: [phone]

## 2023-08-03 DIAGNOSIS — E871 Hypo-osmolality and hyponatremia: Secondary | ICD-10-CM | POA: Diagnosis not present

## 2023-08-05 ENCOUNTER — Ambulatory Visit: Payer: Self-pay | Admitting: *Deleted

## 2023-08-05 NOTE — Patient Outreach (Signed)
Care Coordination   08/05/2023 Name: Ebony Stevens MRN: [id number] DOB: [birth year]   Care Coordination Outreach Attempts:  An unsuccessful telephone outreach was attempted today to offer the patient information about available care coordination services.  Follow Up Plan:  Additional outreach attempts will be made to offer the patient care coordination information and services.   Encounter Outcome:  No Answer   Care Coordination Interventions:  No, not indicated    Donyea Beverlin L. Ebony Penner, RN, BSN, [facility]  VBCI Care Management Coordinator  [phone]  Fax: [phone]

## 2023-08-10 DIAGNOSIS — K7460 Unspecified cirrhosis of liver: Secondary | ICD-10-CM | POA: Diagnosis not present

## 2023-08-12 LAB — NASH FIBROSURE(R) PLUS
ALPHA 2-MACROGLOBULINS, QN: 207 mg/dL (ref 110–276)
ALT (SGPT) P5P: 27 IU/L (ref 0–40)
AST (SGOT) P5P: 36 IU/L (ref 0–40)
Apolipoprotein A-1: 137 mg/dL (ref 116–209)
Bilirubin, Total: 1 mg/dL (ref 0.0–1.2)
Cholesterol, Total: 96 mg/dL — ABNORMAL LOW (ref 100–199)
Fibrosis Score: 0.52 — ABNORMAL HIGH (ref 0.00–0.21)
GGT: 32 IU/L (ref 0–60)
Glucose: 110 mg/dL — ABNORMAL HIGH (ref 70–99)
Haptoglobin: 73 mg/dL (ref 37–355)
NASH Score: 0.84 — ABNORMAL HIGH (ref 0.00–0.25)
Steatosis Score: 0.59 — ABNORMAL HIGH (ref 0.00–0.40)
Triglycerides: 154 mg/dL — ABNORMAL HIGH (ref 0–149)

## 2023-08-12 LAB — COMPREHENSIVE METABOLIC PANEL
ALT: 22 [IU]/L (ref 0–32)
AST: 29 [IU]/L (ref 0–40)
Albumin: 4.5 g/dL (ref 3.9–4.9)
Alkaline Phosphatase: 102 [IU]/L (ref 44–121)
BUN/Creatinine Ratio: 10 — ABNORMAL LOW (ref 12–28)
BUN: 14 mg/dL (ref 8–27)
Bilirubin Total: 1.1 mg/dL (ref 0.0–1.2)
CO2: 19 mmol/L — ABNORMAL LOW (ref 20–29)
Calcium: 9.4 mg/dL (ref 8.7–10.3)
Chloride: 87 mmol/L — ABNORMAL LOW (ref 96–106)
Creatinine, Ser: 1.42 mg/dL — ABNORMAL HIGH (ref 0.57–1.00)
Globulin, Total: 2.4 g/dL (ref 1.5–4.5)
Glucose: 108 mg/dL — ABNORMAL HIGH (ref 70–99)
Potassium: 5.1 mmol/L (ref 3.5–5.2)
Sodium: 126 mmol/L — ABNORMAL LOW (ref 134–144)
Total Protein: 6.9 g/dL (ref 6.0–8.5)
eGFR: 40 mL/min/{1.73_m2} — ABNORMAL LOW (ref >59)

## 2023-08-12 LAB — CBC
Hematocrit: 36.5 % (ref 34.0–46.6)
Hemoglobin: 12.3 g/dL (ref 11.1–15.9)
MCH: 31.5 pg (ref 26.6–33.0)
MCHC: 33.7 g/dL (ref 31.5–35.7)
MCV: 94 fL (ref 79–97)
Platelets: 201 10*3/uL (ref 150–450)
RBC: 3.9 x10E6/uL (ref 3.77–5.28)
RDW: 14.7 % (ref 11.7–15.4)
WBC: 6.1 10*3/uL (ref 3.4–10.8)

## 2023-08-12 LAB — PROTIME-INR
INR: 1 (ref 0.9–1.2)
Prothrombin Time: 11.1 s (ref 9.1–12.0)

## 2023-08-12 LAB — AFP TUMOR MARKER: AFP, Serum, Tumor Marker: 5 ng/mL (ref 0.0–9.2)

## 2023-08-24 DIAGNOSIS — E871 Hypo-osmolality and hyponatremia: Secondary | ICD-10-CM | POA: Diagnosis not present

## 2023-08-31 ENCOUNTER — Ambulatory Visit (HOSPITAL_COMMUNITY)
Admission: RE | Admit: 2023-08-31 | Discharge: 2023-08-31 | Disposition: A | Discharge status: Home / Self Care | Payer: Medicare HMO | Source: Ambulatory Visit | Attending: Gastroenterology | Admitting: Gastroenterology

## 2023-08-31 DIAGNOSIS — K7460 Unspecified cirrhosis of liver: Secondary | ICD-10-CM | POA: Insufficient documentation

## 2023-08-31 DIAGNOSIS — N2889 Other specified disorders of kidney and ureter: Secondary | ICD-10-CM | POA: Diagnosis not present

## 2023-08-31 DIAGNOSIS — Z9049 Acquired absence of other specified parts of digestive tract: Secondary | ICD-10-CM | POA: Diagnosis not present

## 2023-09-01 ENCOUNTER — Ambulatory Visit: Payer: Self-pay | Admitting: *Deleted

## 2023-09-01 NOTE — Patient Outreach (Signed)
  Care Coordination   Initial Visit Note   09/01/2023 Name: Ebony Stevens MRN: [id number] DOB: [birth year]  Ebony Stevens is a 72 y.o. year old female who sees Margo Aye, Kathleene Hazel, MD for primary care. I spoke with  Ebony Stevens by phone today.  What matters to the patients health and wellness today?  congestive Heart Failure (CHF)- edema has improved Left hospital with weight of 255 lbs Falls  in hospital on 07/17/23 & 2 falls prior to admission- no injury with each Uses cane  Asthma/CPAP making face dry   Diabetes had to come off Mounjaro related to the donut cbg 85 *0 is low for her  160 cbg 08/31/23 at bedtime  Taken off a diabetes medications Likes water Mounjaro assists with weight management  Sleeping better since started  Back pain prevents some exercises, completes leg scissors  Last admission on 07/15/23 to 07/19/23 for hyponatremia- Patient reports on her bland diet she was not receiving much sodium but has adjusted this since discharge   Goals Addressed             This Visit's Progress    Improve my health- Weight management, Diabetes, Heart failure- VBCI RN CM   On track    Interventions Today    Flowsheet Row Most Recent Value  Chronic Disease   Chronic disease during today's visit Diabetes, Congestive Heart Failure (CHF), Hypertension (HTN)  General Interventions   General Interventions Discussed/Reviewed General Interventions Discussed, Labs, Durable Medical Equipment (DME), Health Screening, [facility], Doctor Visits  Labs Kidney Function  Doctor Visits Discussed/Reviewed PCP, Specialist, Doctor Visits Discussed  Health Screening --  [Confirmed 2 weeks ago on 08/20/23 she received her flu, rsv and covid vaccines]  Durable Medical Equipment (DME) Glucomoter  PCP/Specialist Visits Compliance with follow-up visit  Exercise Interventions   Exercise Discussed/Reviewed Exercise Discussed, Physical Activity, Weight Managment  Physical Activity  Discussed/Reviewed Physical Activity Discussed, Types of exercise  [chair exercises, leg scissors, raising arms]  Weight Management Weight maintenance  Education Interventions   Education Provided Provided Education  [edema, Clinical cytogeneticist IT, chair exercises, hypoglycemia control, Medicare enrollment period to discuss best plan to prevent Jardiance donut hole, balanced meals,]  Provided Verbal Education On Nutrition, Foot Care, Blood Sugar Monitoring, Exercise, Medication, Insurance Plans, Other  Mental Health Interventions   Mental Health Discussed/Reviewed Mental Health Discussed, Coping Strategies  Nutrition Interventions   Nutrition Discussed/Reviewed Nutrition Discussed, Carbohydrate meal planning, Fluid intake, Portion sizes, Decreasing sugar intake, Increasing proteins, Decreasing fats, Decreasing salt  Pharmacy Interventions   Pharmacy Dicussed/Reviewed Pharmacy Topics Discussed, Medications and their functions, Affording Medications  Safety Interventions   Safety Discussed/Reviewed Safety Discussed, Home Safety  Home Safety Assistive Devices              SDOH assessments and interventions completed:  Yes  SDOH Interventions Today    Flowsheet Row Most Recent Value  SDOH Interventions   Food Insecurity Interventions Intervention Not Indicated  Housing Interventions Intervention Not Indicated  Transportation Interventions Intervention Not Indicated  Utilities Interventions Intervention Not Indicated  Financial Strain Interventions Intervention Not Indicated  Health Literacy Interventions Intervention Not Indicated        Care Coordination Interventions:  Yes, provided   Follow up plan: Follow up call scheduled for 10/01/23    Encounter Outcome:  Patient Visit Completed   Cala Bradford L. Noelle Penner, RN, BSN, [facility]  VBCI Care Management Coordinator  [phone]  Fax: [phone]

## 2023-09-01 NOTE — Patient Instructions (Signed)
Visit Information  Thank you for taking time to visit with me today. Please don't hesitate to contact me if I can be of assistance to you.   Following are the goals we discussed today:   Goals Addressed             This Visit's Progress    Improve my health- Weight management, Diabetes, Heart failure- VBCI RN CM   On track    Interventions Today    Flowsheet Row Most Recent Value  Chronic Disease   Chronic disease during today's visit Diabetes, Congestive Heart Failure (CHF), Hypertension (HTN)  General Interventions   General Interventions Discussed/Reviewed General Interventions Discussed, Labs, Durable Medical Equipment (DME), Health Screening, Community Resources, Doctor Visits  Labs Kidney Function  Doctor Visits Discussed/Reviewed PCP, Specialist, Doctor Visits Discussed  Health Screening --  [Confirmed 2 weeks ago on 08/20/23 she received her flu, rsv and covid vaccines]  Durable Medical Equipment (DME) Glucomoter  PCP/Specialist Visits Compliance with follow-up visit  Exercise Interventions   Exercise Discussed/Reviewed Exercise Discussed, Physical Activity, Weight Managment  Physical Activity Discussed/Reviewed Physical Activity Discussed, Types of exercise  [chair exercises, leg scissors, raising arms]  Weight Management Weight maintenance  Education Interventions   Education Provided Provided Education  [edema, mychart IT, chair exercises, hypoglycemia control, Medicare enrollment period to discuss best plan to prevent Jardiance donut hole, balanced meals,]  Provided Verbal Education On Nutrition, Foot Care, Blood Sugar Monitoring, Exercise, Medication, Insurance Plans, Other  Mental Health Interventions   Mental Health Discussed/Reviewed Mental Health Discussed, Coping Strategies  Nutrition Interventions   Nutrition Discussed/Reviewed Nutrition Discussed, Carbohydrate meal planning, Fluid intake, Portion sizes, Decreasing sugar intake, Increasing proteins, Decreasing  fats, Decreasing salt  Pharmacy Interventions   Pharmacy Dicussed/Reviewed Pharmacy Topics Discussed, Medications and their functions, Affording Medications  Safety Interventions   Safety Discussed/Reviewed Safety Discussed, Home Safety  Home Safety Assistive Devices              Our next appointment is by telephone on 10/01/23 at 1:45 pm  Please call the care guide team at [phone] if you need to cancel or reschedule your appointment.   If you are experiencing a Mental Health or Behavioral Health Crisis or need someone to talk to, please call the Suicide and Crisis Lifeline: 988 call the [place] National Suicide Prevention Lifeline: (817)038-6135 or TTY: (914)828-8054 TTY 865-062-5937) to talk to a trained counselor call 1-800-273-TALK (toll free, 24 hour hotline) call the [facility]: [phone] call 911   Patient verbalizes understanding of instructions and care plan provided today and agrees to view in MyChart. Active MyChart status and patient understanding of how to access instructions and care plan via MyChart confirmed with patient.     The patient has been provided with contact information for the care management team and has been advised to call with any health related questions or concerns.   Ebony Tortorella L. Noelle Penner, RN, BSN, [facility]  VBCI Care Management Coordinator  [phone]  Fax: [phone]

## 2023-10-01 ENCOUNTER — Encounter: Payer: Medicare HMO | Admitting: *Deleted

## 2023-10-01 ENCOUNTER — Ambulatory Visit: Payer: Self-pay | Admitting: *Deleted

## 2023-10-01 NOTE — Patient Outreach (Signed)
  Care Coordination   Follow Up Visit Note   10/01/2023 Name: Ebony Stevens MRN: [id number] DOB: 1950/11/19  Ebony Stevens is a 72 y.o. year old female who sees Margo Aye, Kathleene Hazel, MD for primary care. I spoke with  Tretha Sciara by phone today.  What matters to the patients health and wellness today?  Weight management, donut hole  Weight 235 lbs now, continues taking Mounjaro once a month She has Mounjaro until the end of December 2024 Changing to  [employer] in 2025 and hoping she will not experience "the donut hole" concerns  She voiced understanding of the importance of her providing her pcp & other MDs her 2025 insurance card so it can be scanned so it will be available when she attempts to get further medicines  She voiced appreciation for education on the donut hole, processing mail order, ans scanning new insurance card   Goals Addressed             This Visit's Progress    Improve my health- Weight management, Diabetes, Heart failure- VBCI RN CM   On track    Interventions Today    Flowsheet Row Most Recent Value  Chronic Disease   Chronic disease during today's visit Diabetes, Other  [Donut hole, new 2025 insurance coverage]  General Interventions   General Interventions Discussed/Reviewed General Interventions Reviewed, [facility], Doctor Visits  Doctor Visits Discussed/Reviewed Doctor Visits Reviewed, PCP, Specialist  PCP/Specialist Visits Compliance with follow-up visit  Exercise Interventions   Exercise Discussed/Reviewed Exercise Reviewed, Physical Activity, Weight Managment, Assistive device use and maintanence  Physical Activity Discussed/Reviewed Physical Activity Reviewed, Home Exercise Program (HEP)  Weight Management Weight loss  [assessed last weight Pt confirms 235 lbs]  Education Interventions   Education Provided Provided Education  [educated on donut hole, patient assistance programs]  Provided Verbal Education On --  [Discussed the  donut hole, having MD office scan her insurance card,  mail order services, scam callers & emails]  Mental Health Interventions   Mental Health Discussed/Reviewed Mental Health Reviewed, Coping Strategies, Other  [discussed her concern with scam calls & emails]  Nutrition Interventions   Nutrition Discussed/Reviewed Nutrition Reviewed, Portion sizes, Fluid intake, Decreasing sugar intake, Increasing proteins, Decreasing fats, Decreasing salt  Pharmacy Interventions   Pharmacy Dicussed/Reviewed Pharmacy Topics Reviewed, Medications and their functions, Affording Medications  Safety Interventions   Safety Discussed/Reviewed Safety Reviewed, Home Safety  Home Safety Assistive Devices              SDOH assessments and interventions completed:  No     Care Coordination Interventions:  Yes, provided   Follow up plan: Follow up call scheduled for 11/04/23    Encounter Outcome:  Patient Visit Completed   Cala Bradford L. Noelle Penner, RN, BSN, [facility]  VBCI Care Management Coordinator  [phone]  Fax: [phone]

## 2023-10-01 NOTE — Patient Instructions (Addendum)
Visit Information  Thank you for taking time to visit with me today. Please don't hesitate to contact me if I can be of assistance to you.   Following are the goals we discussed today:   Goals Addressed             This Visit's Progress    Improve my health- Weight management, Diabetes, Heart failure- VBCI RN CM   On track    Interventions Today    Flowsheet Row Most Recent Value  Chronic Disease   Chronic disease during today's visit Diabetes, Other  [Donut hole, new 2025 insurance coverage]  General Interventions   General Interventions Discussed/Reviewed General Interventions Reviewed, [facility], Doctor Visits  Doctor Visits Discussed/Reviewed Doctor Visits Reviewed, PCP, Specialist  PCP/Specialist Visits Compliance with follow-up visit  Exercise Interventions   Exercise Discussed/Reviewed Exercise Reviewed, Physical Activity, Weight Managment, Assistive device use and maintanence  Physical Activity Discussed/Reviewed Physical Activity Reviewed, Home Exercise Program (HEP)  Weight Management Weight loss  [assessed last weight Pt confirms 235 lbs]  Education Interventions   Education Provided Provided Education  [educated on donut hole, patient assistance programs]  Provided Verbal Education On --  [Discussed the donut hole, having MD office scan her insurance card,  mail order services, scam callers & emails]  Mental Health Interventions   Mental Health Discussed/Reviewed Mental Health Reviewed, Coping Strategies, Other  [discussed her concern with scam calls & emails]  Nutrition Interventions   Nutrition Discussed/Reviewed Nutrition Reviewed, Portion sizes, Fluid intake, Decreasing sugar intake, Increasing proteins, Decreasing fats, Decreasing salt  Pharmacy Interventions   Pharmacy Dicussed/Reviewed Pharmacy Topics Reviewed, Medications and their functions, Affording Medications  Safety Interventions   Safety Discussed/Reviewed Safety Reviewed, Home Safety  Home  Safety Assistive Devices              Our next appointment is by telephone on 11/03/22 at 1:45 pm  Please call the care guide team at [phone] if you need to cancel or reschedule your appointment.   If you are experiencing a Mental Health or Behavioral Health Crisis or need someone to talk to, please call the Suicide and Crisis Lifeline: 988 call the [place] National Suicide Prevention Lifeline: 606-810-6088 or TTY: 7162198965 TTY 762-225-3742) to talk to a trained counselor call 1-800-273-TALK (toll free, 24 hour hotline) call the [facility]: [phone] call 911   Patient verbalizes understanding of instructions and care plan provided today and agrees to view in MyChart. Active MyChart status and patient understanding of how to access instructions and care plan via MyChart confirmed with patient.     The patient has been provided with contact information for the care management team and has been advised to call with any health related questions or concerns.   Ciel Chervenak L. Noelle Penner, RN, BSN, [facility]  VBCI Care Management Coordinator  [phone]  Fax: [phone]

## 2023-10-18 ENCOUNTER — Telehealth: Payer: Self-pay | Admitting: Gastroenterology

## 2023-10-18 DIAGNOSIS — D179 Benign lipomatous neoplasm, unspecified: Secondary | ICD-10-CM

## 2023-10-18 DIAGNOSIS — N2889 Other specified disorders of kidney and ureter: Secondary | ICD-10-CM

## 2023-10-18 NOTE — Addendum Note (Signed)
 Addended by: Armstead Peaks on: 10/18/2023 04:43 PM   Modules accepted: Orders

## 2023-10-18 NOTE — Telephone Encounter (Signed)
 LMOM for pt to call office

## 2023-10-18 NOTE — Telephone Encounter (Signed)
 Spoke to pt, she informed me that me that she would like a referral sent to urology.

## 2023-10-18 NOTE — Telephone Encounter (Signed)
 Referral placed.

## 2023-10-18 NOTE — Telephone Encounter (Signed)
 Please reach out to patient to see if she would like the urology referral given the findings on her ultrasound from November.  She previously advised that she wanted to wait until after the beginning of the year.

## 2023-11-03 ENCOUNTER — Ambulatory Visit: Payer: Medicare Other | Admitting: Urology

## 2023-11-03 ENCOUNTER — Encounter: Payer: Self-pay | Admitting: Urology

## 2023-11-03 VITALS — BP 151/68 | Temp 82.0°F

## 2023-11-03 DIAGNOSIS — N2889 Other specified disorders of kidney and ureter: Secondary | ICD-10-CM | POA: Diagnosis not present

## 2023-11-03 NOTE — Patient Instructions (Signed)
A Growth in the Kidney (Renal Mass): What to Know  A renal mass is an abnormal growth in the kidney. It may be found during an MRI, CT scan, or ultrasound that's done to check for other problems in the belly. Some renal masses are cancerous, or malignant, and can grow or spread quickly. Others are benign, which means they're not cancer. Renal masses include: Tumors. These may be either cancerous or benign. The most common cancerous tumor in adults is called renal cell carcinoma. In children, the most common type is Wilms tumor. The most common kidney tumors that aren't cancer include renal adenomas, oncocytomas, and angiomyolipomas (AML). Cysts. These are pockets of fluid that form on or in the kidney. What are the causes? Certain types of cancers, infections, or injuries can cause a renal mass. It's not always known what causes a cyst to form in or on the kidney. What are the signs or symptoms? Often, a renal mass or kidney cyst doesn't cause any signs or symptoms. How is this diagnosed? Your health care provider may suggest tests to diagnose the cause of your renal mass. These tests may include: A physical exam. Blood tests. Pee (urine) tests. Imaging tests, such as: CT scan. MRI. Ultrasound. Chest X-ray or bone scan. These may be done if a tumor is cancerous to see if the cancer has spread outside the kidney. Biopsy. This is when a small piece of tissue is removed from the renal mass for testing. How is this treated? Treatment is not always needed for a renal mass. Treatment will depend on the cause of the mass and if it's causing any problems or symptoms. For a cancerous renal mass, treatment options may include: Surgery. This is done to remove the tumor and any affected tissue. Chemotherapy. This uses medicines to kill cancer cells. Radiation. High-energy X-rays or gamma rays are used to kill cancer cells. Ablation. This uses extreme hot or cold temperature to kill the cancer  cells. Immunotherapy. Medicines are used to help the body's defense system (immune system) fight the cancer cells. Taking part in clinical trials. This involves trying new or experimental treatments to see if they're effective. Most kidney cysts don't need to be treated. Follow these instructions at home: What you need to do at home will depend on the cause of the mass. Follow the instructions that your provider gives you. In general: Take medicines only as told. If you were given antibiotics, take them as told. Do not stop taking them even if you start to feel better. Follow any instructions from your provider about what you can and can't do. Do not smoke, vape, or use nicotine or tobacco. Keep all follow-up visits. Your provider will need to check if your renal mass has changed or grown. Contact a health care provider if: You have flank pain, which is pain in your side or back. You have a fever. You have a loss of appetite. You have pain or swelling in your belly. You lose weight. Get help right away if: Your pain gets worse. There's blood in your pee. You can't pee. This information is not intended to replace advice given to you by your health care provider. Make sure you discuss any questions you have with your health care provider. Document Revised: 03/26/2023 Document Reviewed: 03/26/2023 Elsevier Patient Education  2024 [employer].

## 2023-11-03 NOTE — Progress Notes (Signed)
11/03/2023 9:30 AM   Tretha Sciara 03-28-1951 [id number]  Referring provider: Aida Raider, NP [address] [place],  [place] [address]  Right renal mass   HPI: [name] is a 73yo here for evaluation of a right renal mass. She underwent Abdominal US in 08/2023 and was found to have a 4.5cm right renal mass consistent with AML. She has intermittent right flank pain for the past 4 months which is mostly dull but occasionally is sharp. No prior abdominal imaging except [place]. She underwent chest Ct in 06/2023 for surveillance after sarcoma resection and the mass was seen at that time also. She denies any hematuria. She has bothersome SUI and uses 1-2 pads per day. She has a hx of bladder tack in the 1970s.    PMH: Past Medical History:  Diagnosis Date   Asthma    Chest pain 07/11/2015   Diabetes mellitus without complication (HCC)    Dyspnea 07/11/2015   Early cirrhosis (HCC) 2020   Excessive bleeding in the premenopausal period    Fibromyalgia    GERD (gastroesophageal reflux disease)    HTN (hypertension)    Hypertension    Localized edema    Mixed hyperlipidemia    PONV (postoperative nausea and vomiting)    Sleep apnea    Type 2 diabetes mellitus (HCC)     Surgical History: Past Surgical History:  Procedure Laterality Date   ABDOMINAL HYSTERECTOMY     1975 partial hysterectomy,  1978 "complete hysterctomy and retack bladder". states it didn't take.    BIOPSY  11/10/2018   Procedure: BIOPSY;  Surgeon: Corbin Ade, MD;  [facility]: [facility];  Service: Endoscopy;;  colon    CHOLECYSTECTOMY     COLONOSCOPY WITH PROPOFOL N/A 11/10/2018   Surgeon: Corbin Ade, MD;  1 hyperplastic polyp, random colon biopsies benign, due for repeat in 2025 due to family history of colon cancer.   DILATION AND CURETTAGE OF UTERUS     ESOPHAGOGASTRODUODENOSCOPY (EGD) WITH PROPOFOL N/A 08/21/2021   Surgeon: Corbin Ade, MD; Mild Schatzki's ring dilated, small hiatal hernia, no  portal gastropathy, normal examined duodenum.   MALONEY DILATION  08/21/2021   Procedure: MALONEY DILATION;  Surgeon: Corbin Ade, MD;  [facility]: [facility];  Service: Endoscopy;;   POLYPECTOMY  11/10/2018   Procedure: POLYPECTOMY;  Surgeon: Corbin Ade, MD;  [facility]: [facility];  Service: Endoscopy;;  colon    TONSILLECTOMY      Home Medications:  Allergies as of 11/03/2023   No Known Allergies      Medication List        Accurate as of November 03, 2023  9:30 AM. If you have any questions, ask your nurse or doctor.          acetaminophen 500 MG tablet Commonly known as: TYLENOL Take 500 mg by mouth every 6 (six) hours as needed for moderate pain.   albuterol 108 (90 Base) MCG/ACT inhaler Commonly known as: VENTOLIN HFA Inhale 1-2 puffs into the lungs every 6 (six) hours as needed for wheezing or shortness of breath.   aspirin EC 325 MG tablet Take 325 mg by mouth daily.   atorvastatin 40 MG tablet Commonly known as: LIPITOR Take 40 mg by mouth daily.   CALTRATE 600+D PO Take 1 tablet by mouth daily.   cetirizine 10 MG tablet Commonly known as: ZYRTEC Take 10 mg by mouth daily.   estradiol 1 MG tablet Commonly known as: ESTRACE  Take 1 tablet by mouth at bedtime.   furosemide 40 MG tablet Commonly known as: LASIX TAKE ONE TABLET BY MOUTH EVERY OTHER DAY What changed: when to take this   glipiZIDE 10 MG tablet Commonly known as: GLUCOTROL Take 10 mg by mouth daily before breakfast.   insulin NPH-regular Human (70-30) 100 UNIT/ML injection Inject 60-70 Units into the skin at bedtime.   insulin regular 100 units/mL injection Commonly known as: NOVOLIN R Inject 10 Units into the skin 3 (three) times daily after meals.   lisinopril 40 MG tablet Commonly known as: ZESTRIL Take 40 mg by mouth daily.   loperamide 2 MG tablet Commonly known as: IMODIUM A-D Take 2 mg by mouth as needed for diarrhea or loose stools.   metFORMIN 500 MG 24 hr  tablet Commonly known as: GLUCOPHAGE-XR Take 1,000 mg by mouth 2 (two) times daily.   metoprolol succinate 25 MG 24 hr tablet Commonly known as: TOPROL-XL Take 1 tablet (25 mg total) by mouth daily.   Mounjaro 5 MG/0.5ML Pen Generic drug: tirzepatide Inject 5 mg into the skin once a week.   pantoprazole 40 MG tablet Commonly known as: PROTONIX TAKE 1 TABLET EVERY DAY BEFORE BREAKFAST   spironolactone 50 MG tablet Commonly known as: Aldactone Take 1 tablet (50 mg total) by mouth daily.        Allergies: No Known Allergies  Family History: Family History  Problem Relation Age of Onset   Cancer Sister        ovarian   Diabetes Brother    Colon cancer Brother 64       Passed from [facility] after 1 year    Social History:  reports that she has never smoked. She has never used smokeless tobacco. She reports that she does not drink alcohol and does not use drugs.  ROS: All other review of systems were reviewed and are negative except what is noted above in HPI  Physical Exam: BP (!) 151/68   Temp (!) 82 F (27.8 C)   Constitutional:  Alert and oriented, No acute distress. HEENT: [place] AT, moist mucus membranes.  Trachea midline, no masses. Cardiovascular: No clubbing, cyanosis, or edema. Respiratory: Normal respiratory effort, no increased work of breathing. GI: Abdomen is soft, nontender, nondistended, no abdominal masses GU: No CVA tenderness.  Lymph: No cervical or inguinal lymphadenopathy. Skin: No rashes, bruises or suspicious lesions. Neurologic: Grossly intact, no focal deficits, moving all 4 extremities. Psychiatric: Normal mood and affect.  Laboratory Data: Lab Results  Component Value Date   WBC 6.1 08/10/2023   HGB 12.3 08/10/2023   HCT 36.5 08/10/2023   MCV 94 08/10/2023   PLT 201 08/10/2023    Lab Results  Component Value Date   CREATININE 1.42 (H) 08/10/2023    No results found for: "PSA"  No results found for: "TESTOSTERONE"  Lab Results   Component Value Date   HGBA1C 5.9 (H) 07/15/2023    Urinalysis    Component Value Date/Time   COLORURINE STRAW (A) 07/15/2023 1956   APPEARANCEUR CLEAR 07/15/2023 1956   LABSPEC 1.004 (L) 07/15/2023 1956   PHURINE 5.0 07/15/2023 1956   GLUCOSEU NEGATIVE 07/15/2023 1956   HGBUR NEGATIVE 07/15/2023 1956   BILIRUBINUR NEGATIVE 07/15/2023 1956   KETONESUR NEGATIVE 07/15/2023 1956   PROTEINUR NEGATIVE 07/15/2023 1956   NITRITE NEGATIVE 07/15/2023 1956   LEUKOCYTESUR NEGATIVE 07/15/2023 1956    No results found for: "LABMICR", "WBCUA", "RBCUA", "LABEPIT", "MUCUS", "BACTERIA"  Pertinent Imaging: 08/31/2023: Images reviewed  and discussed with the patient  No results found for this or any previous visit.  No results found for this or any previous visit.  No results found for this or any previous visit.  No results found for this or any previous visit.  No results found for this or any previous visit.  No results found for this or any previous visit.  No results found for this or any previous visit.  No results found for this or any previous visit.   Assessment & Plan:    1. Right renal mass (Primary) -We will obtain an MRI abd to better characterize the mass.    No follow-ups on file.  Wilkie Aye, MD  [facility] [place]

## 2023-11-04 ENCOUNTER — Ambulatory Visit: Payer: Self-pay | Admitting: *Deleted

## 2023-11-04 NOTE — Patient Outreach (Addendum)
  Care Coordination   Follow Up Visit Note   11/04/2023 Name: Ebony Stevens MRN: [id number] DOB: January 25, 1951  Ebony Stevens is a 73 y.o. year old female who sees Margo Aye, Kathleene Hazel, MD for primary care. I spoke with  Tretha Sciara by phone today.  What matters to the patients health and wellness today?  Mounjaro medication assistance - not in stock at her local [facility], Sodium over the counter tabs, MRI abdomen pending for 11/12/23   Was told before she chose her new insurance coverage that [facility] would be covered  [employer] medicare  advantage [employer] [place] [address] HMO pos with dental group [id number] pcp 0 specialist [phone] [email or web address] Moujaro not in stock at [employer]  Urology growth on liver found at [facility] oncology after imaging    Goals Addressed             This Visit's Progress    Improve my health- Weight management, Diabetes, Heart failure- VBCI RN CM       Interventions Today    Flowsheet Row Most Recent Value  Chronic Disease   Chronic disease during today's visit Diabetes, Other  [follow up MRI, Mounjaro, sodium tabs]  General Interventions   General Interventions Discussed/Reviewed General Interventions Reviewed, [facility], Doctor Visits, Communication with  Doctor Visits Discussed/Reviewed Doctor Visits Reviewed, PCP, Specialist  PCP/Specialist Visits Compliance with follow-up visit  Communication with PCP/Specialists  Education Interventions   Education Provided Provided Education  Provided Verbal Education On Mental Health/Coping with Illness, Medication, Development worker, community, Other  Mental Health Interventions   Mental Health Discussed/Reviewed Mental Health Reviewed, Coping Strategies  Pharmacy Interventions   Pharmacy Dicussed/Reviewed Pharmacy Topics Reviewed, Medications and their functions, Affording Medications              SDOH assessments and interventions completed:  No     Care Coordination Interventions:  Yes,  provided   Follow up plan: Follow up call scheduled for 11/15/23    Encounter Outcome:  Patient Visit Completed   Cala Bradford L. Noelle Penner, RN, BSN, CCM [place]  [facility], [facility] RN Care Manager Direct Dial: [phone]  Fax: [phone] Mailing Address: [address]  [place] [address] Website: [email or web address]

## 2023-11-12 ENCOUNTER — Ambulatory Visit (HOSPITAL_COMMUNITY)
Admission: RE | Admit: 2023-11-12 | Discharge: 2023-11-12 | Disposition: A | Discharge status: Home / Self Care | Payer: Medicare Other | Source: Ambulatory Visit | Attending: Urology

## 2023-11-12 DIAGNOSIS — N2889 Other specified disorders of kidney and ureter: Secondary | ICD-10-CM | POA: Insufficient documentation

## 2023-11-12 MED ORDER — GADOBUTROL 1 MMOL/ML IV SOLN
10.0000 mL | Freq: Once | INTRAVENOUS | Status: AC | PRN
  Administered 2023-11-12: 10 mL via INTRAVENOUS

## 2023-11-15 ENCOUNTER — Ambulatory Visit: Payer: Self-pay | Admitting: *Deleted

## 2023-11-15 NOTE — Patient Outreach (Addendum)
  Care Coordination   Follow Up Visit Note   11/15/2023 Name: Ebony Stevens MRN: [id number] DOB: Apr 12, 1951  Ebony Stevens is a 73 y.o. year old female who sees Margo Aye, Kathleene Hazel, MD for primary care. I spoke with  Tretha Sciara by phone today.  What matters to the patients health and wellness today?  Mounjaro pre authorization , wants to continue use of [employer] Letter received stating she needs more information for pre authorization from MD. She reports not understanding the letter and asked RN CM to help interpret. She confirms also she has called the pcp office to inquire on the status but was informed the insurance response was pending   Gave permission for RN CM to check the  [place] [facility] 2025 formulary for her [place] 8657 HMO pos plan. Voiced understanding that Greggory Keen is listed as a brand tier 3 medicines needing prior authorization. Preference is for RN CM to outreach to her pcp staff vs her returning another call    Goals Addressed             This Visit's Progress    Improve my health- Weight management, Diabetes, Heart failure- VBCI RN CM   Not on track    Interventions Today    Flowsheet Row Most Recent Value  Chronic Disease   Chronic disease during today's visit Diabetes, Hypertension (HTN), Other  [asthma, weight managment/mounjaro]  General Interventions   General Interventions Discussed/Reviewed General Interventions Reviewed, Labs, [facility], Doctor Visits, Communication with  Labs Hgb A1c every 3 months  Doctor Visits Discussed/Reviewed Doctor Visits Reviewed, PCP, Specialist  [discussed the [facility] weight program]  PCP/Specialist Visits Compliance with follow-up visit  Communication with PCP/Specialists, RN  [email to K Cheek at Pcp office - preauth status of mounjaro]  Exercise Interventions   Exercise Discussed/Reviewed Exercise Reviewed, Physical Activity, Weight Managment  Physical Activity  Discussed/Reviewed Physical Activity Reviewed, Types of exercise  Weight Management Weight maintenance  [stats present weight is 223 lbs at 5'2" BMI 40.8]  Education Interventions   Education Provided Provided Education  Provided Verbal Education On Nutrition, Labs, Mental Health/Coping with Illness, Exercise, Medication, [facility], Development worker, community, Other  [discussed [employer] vs [employer], Librarian, academic with Advertising account planner, movement, Mounjaro, HgA1c, criteria for [employer]  Mental Health Interventions   Mental Health Discussed/Reviewed Mental Health Reviewed, Coping Strategies  Nutrition Interventions   Nutrition Discussed/Reviewed Nutrition Reviewed, Fluid intake, Decreasing sugar intake, Adding fruits and vegetables  Pharmacy Interventions   Pharmacy Dicussed/Reviewed Pharmacy Topics Reviewed, Medications and their functions, Affording Medications  [discusswed the 2025 formulary for [facility] - mounjaro on tier 3 needed quantity limit, prior authorization co pay cost of $35]  Advanced Directive Interventions   Advanced Directives Discussed/Reviewed Advanced Directives Reviewed              SDOH assessments and interventions completed:  No     Care Coordination Interventions:  Yes, provided   Follow up plan: Follow up call scheduled for 12/16/23    Encounter Outcome:  Patient Visit Completed   Cala Bradford L. Noelle Penner, RN, BSN, CCM [place]  [facility], [facility] RN Care Manager Direct Dial: [phone]  Fax: [phone] Mailing Address: [address]  [place] [address] Website: [email or web address]

## 2023-11-15 NOTE — Patient Instructions (Addendum)
Visit Information  Thank you for taking time to visit with me today. Please don't hesitate to contact me if I can be of assistance to you.    2025 medicine formulary/Mounjaro cost  [email or web address]  Following are the goals we discussed today:   Goals Addressed             This Visit's Progress    Improve my health- Weight management, Diabetes, Heart failure- VBCI RN CM   Not on track    Interventions Today    Flowsheet Row Most Recent Value  Chronic Disease   Chronic disease during today's visit Diabetes, Hypertension (HTN), Other  [asthma, weight managment/mounjaro]  General Interventions   General Interventions Discussed/Reviewed General Interventions Reviewed, Labs, [facility], Doctor Visits, Communication with  Labs Hgb A1c every 3 months  Doctor Visits Discussed/Reviewed Doctor Visits Reviewed, PCP, Specialist  [discussed the [facility] weight program]  PCP/Specialist Visits Compliance with follow-up visit  Communication with PCP/Specialists, RN  [email to K Cheek at Pcp office - preauth status of mounjaro]  Exercise Interventions   Exercise Discussed/Reviewed Exercise Reviewed, Physical Activity, Weight Managment  Physical Activity Discussed/Reviewed Physical Activity Reviewed, Types of exercise  Weight Management Weight maintenance  [stats present weight is 223 lbs at 5'2" BMI 40.8]  Education Interventions   Education Provided Provided Education  Provided Verbal Education On Nutrition, Labs, Mental Health/Coping with Illness, Exercise, Medication, [facility], Development worker, community, Other  [discussed U[employer] vs [employer], Librarian, academic with [employer] account planner, movement, Mounjaro, HgA1c, criteria for [employer]  Mental Health Interventions   Mental Health Discussed/Reviewed Mental Health Reviewed, Coping Strategies  Nutrition Interventions   Nutrition Discussed/Reviewed Nutrition Reviewed, Fluid intake,  Decreasing sugar intake, Adding fruits and vegetables  Pharmacy Interventions   Pharmacy Dicussed/Reviewed Pharmacy Topics Reviewed, Medications and their functions, Affording Medications  [discusswed the 2025 formulary for [facility] - mounjaro on tier 3 needed quantity limit, prior authorization co pay cost of $35]  Advanced Directive Interventions   Advanced Directives Discussed/Reviewed Advanced Directives Reviewed              Our next appointment is by telephone on 12/16/23 at 1 pm  Please call the care guide team at [phone] if you need to cancel or reschedule your appointment.   If you are experiencing a Mental Health or Behavioral Health Crisis or need someone to talk to, please call the Suicide and Crisis Lifeline: 988 call the [place] National Suicide Prevention Lifeline: 785-510-4385 or TTY: (539)473-3644 TTY 873-517-2911) to talk to a trained counselor call 1-800-273-TALK (toll free, 24 hour hotline) call the [facility]: (516)852-2269 call 911   Patient verbalizes understanding of instructions and care plan provided today and agrees to view in MyChart. Active MyChart status and patient understanding of how to access instructions and care plan via MyChart confirmed with patient.     The patient has been provided with contact information for the care management team and has been advised to call with any health related questions or concerns.   Omar Gayden L. Noelle Penner, RN, BSN, CCM [place]  [facility], [facility] RN Care Manager Direct Dial: [phone]  Fax: [phone] Mailing Address: [address]  [place] [address] Website: [email or web address]

## 2023-12-07 ENCOUNTER — Encounter: Payer: Self-pay | Admitting: Gastroenterology

## 2023-12-15 ENCOUNTER — Ambulatory Visit: Payer: Medicare Other | Admitting: Urology

## 2023-12-15 ENCOUNTER — Encounter: Payer: Self-pay | Admitting: Urology

## 2023-12-15 VITALS — BP 124/65 | HR 84

## 2023-12-15 DIAGNOSIS — N2889 Other specified disorders of kidney and ureter: Secondary | ICD-10-CM | POA: Diagnosis not present

## 2023-12-15 LAB — URINALYSIS, ROUTINE W REFLEX MICROSCOPIC
Bilirubin, UA: NEGATIVE
Glucose, UA: NEGATIVE
Ketones, UA: NEGATIVE
Leukocytes,UA: NEGATIVE
Nitrite, UA: NEGATIVE
Protein,UA: NEGATIVE
RBC, UA: NEGATIVE
Specific Gravity, UA: 1.01 (ref 1.005–1.030)
Urobilinogen, Ur: 0.2 mg/dL (ref 0.2–1.0)
pH, UA: 6 (ref 5.0–7.5)

## 2023-12-15 NOTE — Progress Notes (Signed)
 12/15/2023 1:55 PM   Ebony Stevens [birth year] [id number]  Referring provider: Benita Stabile, MD [address] [place]ony Stevens,  [place] [address]  Right renal mass   HPI: [name] is a 72yo here for followup for a right renal mass. 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 No hematuria. No significant LUTS. No flank pain.    PMH: Past Medical History:  Diagnosis Date   Asthma    Chest pain 07/11/2015   Diabetes mellitus without complication (HCC)    Dyspnea 07/11/2015   Early cirrhosis (HCC) 2020   Excessive bleeding in the premenopausal period    Fibromyalgia    GERD (gastroesophageal reflux disease)    HTN (hypertension)    Hypertension    Localized edema    Mixed hyperlipidemia    PONV (postoperative nausea and vomiting)    Sleep apnea    Type 2 diabetes mellitus (HCC)     Surgical History: Past Surgical History:  Procedure Laterality Date   ABDOMINAL HYSTERECTOMY     1975 partial hysterectomy,  1978 "complete hysterctomy and retack bladder". states it didn't take.    BIOPSY  11/10/2018   Procedure: BIOPSY;  Surgeon: Corbin Ade, MD;  [facility];  Service: Endoscopy;;  colon    CHOLECYSTECTOMY     COLONOSCOPY WITH PROPOFOL N/A 11/10/2018   Surgeon: Corbin Ade, MD;  1 hyperplastic polyp, random colon biopsies benign, due for repeat in 2025 due to family history of colon cancer.   DILATION AND CURETTAGE OF UTERUS     ESOPHAGOGASTRODUODENOSCOPY (EGD) WITH PROPOFOL N/A 08/21/2021   Surgeon: Corbin Ade, MD; Mild Schatzki's ring dilated, small hiatal hernia, no portal gastropathy, normal examined duodenum.   MALONEY DILATION  08/21/2021   Procedure: MALONEY DILATION;  Surgeon: Corbin Ade, MD;  [facility];  Service: Endoscopy;;   POLYPECTOMY  11/10/2018   Procedure: POLYPECTOMY;  Surgeon: Corbin Ade, MD;  [facility];  Service: Endoscopy;;  colon    TONSILLECTOMY       Home Medications:  Allergies as of 12/15/2023   No Known Allergies      Medication List        Accurate as of December 15, 2023  1:55 PM. If you have any questions, ask your nurse or doctor.          STOP taking these medications    glipiZIDE 10 MG tablet Commonly known as: GLUCOTROL   lisinopril 40 MG tablet Commonly known as: ZESTRIL       TAKE these medications    acetaminophen 500 MG tablet Commonly known as: TYLENOL Take 500 mg by mouth every 6 (six) hours as needed for moderate pain.   albuterol 108 (90 Base) MCG/ACT inhaler Commonly known as: VENTOLIN HFA Inhale 1-2 puffs into the lungs every 6 (six) hours as needed for wheezing or shortness of breath.   aspirin EC 325 MG tablet Take 325 mg by mouth daily.   atorvastatin 40 MG tablet Commonly known as: LIPITOR Take 40 mg by mouth daily.   CALTRATE 600+D PO Take 1 tablet by mouth daily.   cetirizine 10 MG tablet Commonly known as: ZYRTEC Take 10 mg by mouth daily.   estradiol 1 MG tablet Commonly known as: ESTRACE Take 1 tablet by mouth at bedtime.   furosemide 40 MG tablet Commonly known as: LASIX TAKE ONE TABLET BY MOUTH EVERY OTHER DAY What changed:  when to take this   insulin NPH-regular Human (70-30) 100 UNIT/ML injection Inject 60-70 Units into the skin at bedtime.   insulin regular 100 units/mL injection Commonly known as: NOVOLIN R Inject 10 Units into the skin 3 (three) times daily after meals.   loperamide 2 MG tablet Commonly known as: IMODIUM A-D Take 2 mg by mouth as needed for diarrhea or loose stools.   metFORMIN 500 MG 24 hr tablet Commonly known as: GLUCOPHAGE-XR Take 500 mg by mouth 2 (two) times daily.   metoprolol succinate 25 MG 24 hr tablet Commonly known as: TOPROL-XL Take 1 tablet (25 mg total) by mouth daily.   Mounjaro 5 MG/0.5ML Pen Generic drug: tirzepatide Inject 5 mg into the skin once a week.   olmesartan 40 MG tablet Commonly known as:  BENICAR Take 40 mg by mouth daily.   pantoprazole 40 MG tablet Commonly known as: PROTONIX TAKE 1 TABLET EVERY DAY BEFORE BREAKFAST   spironolactone 50 MG tablet Commonly known as: Aldactone Take 1 tablet (50 mg total) by mouth daily.        Allergies: No Known Allergies  Family History: Family History  Problem Relation Age of Onset   Cancer Sister        ovarian   Diabetes Brother    Colon cancer Brother 94       Passed from [facility] after 1 year    Social History:  reports that she has never smoked. She has never used smokeless tobacco. She reports that she does not drink alcohol and does not use drugs.  ROS: All other review of systems were reviewed and are negative except what is noted above in HPI  Physical Exam: BP 124/65   Pulse 84   Constitutional:  Alert and oriented, No acute distress. HEENT: [place] AT, moist mucus membranes.  Trachea midline, no masses. Cardiovascular: No clubbing, cyanosis, or edema. Respiratory: Normal respiratory effort, no increased work of breathing. GI: Abdomen is soft, nontender, nondistended, no abdominal masses GU: No CVA tenderness.  Lymph: No cervical or inguinal lymphadenopathy. Skin: No rashes, bruises or suspicious lesions. Neurologic: Grossly intact, no focal deficits, moving all 4 extremities. Psychiatric: Normal mood and affect.  Laboratory Data: Lab Results  Component Value Date   WBC 6.1 08/10/2023   HGB 12.3 08/10/2023   HCT 36.5 08/10/2023   MCV 94 08/10/2023   PLT 201 08/10/2023    Lab Results  Component Value Date   CREATININE 1.42 (H) 08/10/2023    No results found for: "PSA"  No results found for: "TESTOSTERONE"  Lab Results  Component Value Date   HGBA1C 5.9 (H) 07/15/2023    Urinalysis    Component Value Date/Time   COLORURINE STRAW (A) 07/15/2023 1956   APPEARANCEUR CLEAR 07/15/2023 1956   LABSPEC 1.004 (L) 07/15/2023 1956   PHURINE 5.0 07/15/2023 1956   GLUCOSEU NEGATIVE 07/15/2023 1956    HGBUR NEGATIVE 07/15/2023 1956   BILIRUBINUR NEGATIVE 07/15/2023 1956   KETONESUR NEGATIVE 07/15/2023 1956   PROTEINUR NEGATIVE 07/15/2023 1956   NITRITE NEGATIVE 07/15/2023 1956   LEUKOCYTESUR NEGATIVE 07/15/2023 1956    No results found for: "LABMICR", "WBCUA", "RBCUA", "LABEPIT", "MUCUS", "BACTERIA"  Pertinent Imaging: MRI 11/21/2023: Images reviewqed and discussed with the patient  No results found for this or any previous visit.  No results found for this or any previous visit.  No results found for this or any previous visit.  No results found for this or any previous visit.  No results found  for this or any previous visit.  No results found for this or any previous visit.  No results found for this or any previous visit.  No results found for this or any previous visit.   Assessment & Plan:    1. Right renal mass (Primary) We discussed the natural hx of renal masses and the benign nature of AMLs. We disucssed the treatment options including active surveillance, renal ablation, partial and radical nephrectomy. After discussing the options the patient elects for surveillance. Followup  6 monhts with a renal US - Urinalysis, Routine w reflex microscopic   No follow-ups on file.  Wilkie Aye, MD  [facility] [place]

## 2023-12-16 ENCOUNTER — Telehealth: Payer: Self-pay | Admitting: Urology

## 2023-12-16 ENCOUNTER — Ambulatory Visit: Payer: Self-pay | Admitting: *Deleted

## 2023-12-16 NOTE — Telephone Encounter (Signed)
 Patient called she would like to proceed with surgery, please call to schedule.

## 2023-12-16 NOTE — Telephone Encounter (Signed)
Please add to surgery workque

## 2023-12-16 NOTE — Telephone Encounter (Signed)
 See below

## 2023-12-16 NOTE — Patient Outreach (Signed)
 Care Coordination   Follow Up Visit Note   01/14/2024 updated noted for 12/16/23 Name: Ebony Stevens MRN: [id number] DOB: 03-17-1951  Ebony Stevens is a 73 y.o. year old female who sees Margo Aye, Kathleene Hazel, MD for primary care. I spoke with  Tretha Sciara by phone today.  What matters to the patients health and wellness today?  Review of MRI renal mass Voiced understanding of her impression of the MRI  She has difficulty determining her origin of her pain  Going to urology clinics under Dr Ronne Binning   Goals Addressed             This Visit's Progress    Improve my health- Weight management, Diabetes, Heart failure- VBCI RN CM       Interventions Today    Flowsheet Row Most Recent Value  Chronic Disease   Chronic disease during today's visit Diabetes, Hypertension (HTN), Other  [MRI result]  General Interventions   General Interventions Discussed/Reviewed General Interventions Reviewed, Doctor Visits, Labs  Labs --  [MRI]  Doctor Visits Discussed/Reviewed Doctor Visits Reviewed, PCP, Specialist  PCP/Specialist Visits Compliance with follow-up visit  Education Interventions   Education Provided Provided Education  [pain management]  Provided Verbal Education On Other, Labs  Labs Reviewed --  [MRI]  Mental Health Interventions   Mental Health Discussed/Reviewed Mental Health Reviewed, Coping Strategies  Pharmacy Interventions   Pharmacy Dicussed/Reviewed Pharmacy Topics Reviewed, Affording Medications, Medications and their functions  Safety Interventions   Safety Discussed/Reviewed Safety Reviewed, Fall Risk, Home Safety  Home Safety Assistive Devices              SDOH assessments and interventions completed:  No     Care Coordination Interventions:  Yes, provided   Follow up plan: Follow up call scheduled for 01/17/24    Encounter Outcome:  Patient Visit Completed   Cala Bradford L. Noelle Penner, RN, BSN, CCM [place]  [facility], [facility] RN  Care Manager Direct Dial: [phone]  Fax: [phone]

## 2023-12-21 ENCOUNTER — Other Ambulatory Visit: Payer: Self-pay | Admitting: Urology

## 2023-12-21 DIAGNOSIS — N2889 Other specified disorders of kidney and ureter: Secondary | ICD-10-CM

## 2023-12-22 NOTE — Telephone Encounter (Signed)
 I called patient to see how she wanted to proceed surgically.  She wanted to know what Dr. Ronne Binning recommended.  Verbal from Dr. Ronne Binning he recommended renal ablation vs nephrectomy.  Patient had several surgical questions, I offered her an appt with Sarah to discuss the different options.  Appt scheduled.

## 2023-12-24 ENCOUNTER — Ambulatory Visit: Admitting: Urology

## 2023-12-24 NOTE — Progress Notes (Deleted)
 Name: Ebony Stevens DOB: 1951/01/24 MRN: [id number]  History of Present Illness: Ebony Stevens is a 73 y.o. female who presents today for follow up visit at [facility].  ***She is accompanied by ***. - GU history: 1. Right renal mass.  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 No hematuria. No significant LUTS. No flank pain.   At last visit with Dr. Ronne Binning on 12/15/2023: ***  Since last visit: ***  Today: She reports ***   Medications: Current Outpatient Medications  Medication Sig Dispense Refill   acetaminophen (TYLENOL) 500 MG tablet Take 500 mg by mouth every 6 (six) hours as needed for moderate pain.     albuterol (VENTOLIN HFA) 108 (90 Base) MCG/ACT inhaler Inhale 1-2 puffs into the lungs every 6 (six) hours as needed for wheezing or shortness of breath.     aspirin EC 325 MG tablet Take 325 mg by mouth daily.     atorvastatin (LIPITOR) 40 MG tablet Take 40 mg by mouth daily.     Calcium Carbonate-Vitamin D (CALTRATE 600+D PO) Take 1 tablet by mouth daily.     cetirizine (ZYRTEC) 10 MG tablet Take 10 mg by mouth daily.     estradiol (ESTRACE) 1 MG tablet Take 1 tablet by mouth at bedtime.     furosemide (LASIX) 40 MG tablet TAKE ONE TABLET BY MOUTH EVERY OTHER DAY (Patient taking differently: Take 40 mg by mouth daily.) 15 tablet 2   insulin NPH-regular Human (70-30) 100 UNIT/ML injection Inject 60-70 Units into the skin at bedtime. (Patient not taking: Reported on 07/15/2023)     insulin regular (NOVOLIN R) 100 units/mL injection Inject 10 Units into the skin 3 (three) times daily after meals. (Patient not taking: Reported on 07/15/2023)     loperamide (IMODIUM A-D) 2 MG tablet Take 2 mg by mouth as needed for diarrhea or loose stools.     metFORMIN (GLUCOPHAGE-XR) 500 MG 24 hr tablet Take 500 mg by mouth 2 (two) times daily.     metoprolol succinate (TOPROL-XL) 25 MG 24 hr tablet Take 1 tablet (25 mg total) by mouth  daily. 30 tablet 1   MOUNJARO 5 MG/0.5ML Pen Inject 5 mg into the skin once a week.     olmesartan (BENICAR) 40 MG tablet Take 40 mg by mouth daily.     pantoprazole (PROTONIX) 40 MG tablet TAKE 1 TABLET EVERY DAY BEFORE BREAKFAST 90 tablet 3   spironolactone (ALDACTONE) 50 MG tablet Take 1 tablet (50 mg total) by mouth daily. 30 tablet 11   No current facility-administered medications for this visit.    Allergies: No Known Allergies  Past Medical History:  Diagnosis Date   Asthma    Chest pain 07/11/2015   Diabetes mellitus without complication (HCC)    Dyspnea 07/11/2015   Early cirrhosis (HCC) 2020   Excessive bleeding in the premenopausal period    Fibromyalgia    GERD (gastroesophageal reflux disease)    HTN (hypertension)    Hypertension    Localized edema    Mixed hyperlipidemia    PONV (postoperative nausea and vomiting)    Sleep apnea    Type 2 diabetes mellitus (HCC)    Past Surgical History:  Procedure Laterality Date   ABDOMINAL HYSTERECTOMY     1975 partial hysterectomy,  1978 "complete hysterctomy and retack bladder". states it didn't take.    BIOPSY  11/10/2018   Procedure: BIOPSY;  Surgeon: Eula Listen  M, MD;  [facility];  Service: Endoscopy;;  colon    CHOLECYSTECTOMY     COLONOSCOPY WITH PROPOFOL N/A 11/10/2018   Surgeon: Corbin Ade, MD;  1 hyperplastic polyp, random colon biopsies benign, due for repeat in 2025 due to family history of colon cancer.   DILATION AND CURETTAGE OF UTERUS     ESOPHAGOGASTRODUODENOSCOPY (EGD) WITH PROPOFOL N/A 08/21/2021   Surgeon: Corbin Ade, MD; Mild Schatzki's ring dilated, small hiatal hernia, no portal gastropathy, normal examined duodenum.   MALONEY DILATION  08/21/2021   Procedure: MALONEY DILATION;  Surgeon: Corbin Ade, MD;  [facility];  Service: Endoscopy;;   POLYPECTOMY  11/10/2018   Procedure: POLYPECTOMY;  Surgeon: Corbin Ade, MD;  [facility];  Service:  Endoscopy;;  colon    TONSILLECTOMY     Family History  Problem Relation Age of Onset   Cancer Sister        ovarian   Diabetes Brother    Colon cancer Brother 49       Passed from [facility] after 1 year   Social History   Socioeconomic History   Marital status: Married    Spouse name: Not on file   Number of children: Not on file   Years of education: Not on file   Highest education level: Not on file  Occupational History   Not on file  Tobacco Use   Smoking status: Never   Smokeless tobacco: Never  Vaping Use   Vaping status: Never Used  Substance and Sexual Activity   Alcohol use: No   Drug use: No   Sexual activity: Not Currently  Other Topics Concern   Not on file  Social History Narrative   ** Merged History Encounter **       Social Drivers of Health   Financial Resource Strain: Low Risk  (09/01/2023)   Overall Financial Resource Strain (CARDIA)    Difficulty of Paying Living Expenses: Not very hard  Food Insecurity: No Food Insecurity (09/01/2023)   Hunger Vital Sign    Worried About Running Out of Food in the Last Year: Never true    Ran Out of Food in the Last Year: Never true  Transportation Needs: No Transportation Needs (09/01/2023)   PRAPARE - Administrator, Civil Service (Medical): No    Lack of Transportation (Non-Medical): No  Physical Activity: Not on file  Stress: Not on file  Social Connections: Not on file  Intimate Partner Violence: Not At Risk (07/15/2023)   Humiliation, Afraid, Rape, and Kick questionnaire    Fear of Current or Ex-Partner: No    Emotionally Abused: No    Physically Abused: No    Sexually Abused: No    Review of Systems Constitutional: Patient denies any unintentional weight loss or change in strength lntegumentary: Patient denies any rashes or pruritus Cardiovascular: Patient denies chest pain or syncope Respiratory: Patient denies shortness of breath Gastrointestinal: ***Patient {Actions;  denies-reports:120008} ***nausea, ***vomiting, ***constipation, ***diarrhea ***As per HPI Musculoskeletal: Patient denies muscle cramps or weakness Neurologic: Patient denies convulsions or seizures Allergic/Immunologic: Patient denies recent allergic reaction(s) Hematologic/Lymphatic: Patient denies bleeding tendencies Endocrine: Patient denies heat/cold intolerance  GU: As per HPI.  OBJECTIVE There were no vitals filed for this visit. There is no height or weight on file to calculate BMI.  Physical Examination Constitutional: No obvious distress; patient is non-toxic appearing  Cardiovascular: No visible lower extremity edema.  Respiratory: The patient does not  have audible wheezing/stridor; respirations do not appear labored  Gastrointestinal: Abdomen non-distended Musculoskeletal: Normal ROM of UEs  Skin: No obvious rashes/open sores  Neurologic: CN 2-12 grossly intact Psychiatric: Answered questions appropriately with normal affect  Hematologic/Lymphatic/Immunologic: No obvious bruises or sites of spontaneous bleeding  UA:  ***positive for *** leukocytes, *** blood, ***nitrites ***Urine microscopy:  ***negative  *** WBC/hpf, *** RBC/hpf, *** bacteria ***with no evidence of UTI ***with no evidence of microscopic hematuria ***otherwise unremarkable ***glucosuria (secondary to ***Jardiance ***Farxiga use)  PVR: *** ml  ASSESSMENT No diagnosis found. ***  We agreed to plan for follow up in *** months / ***1 year or sooner if needed. Patient verbalized understanding of and agreement with current plan. All questions were answered.  PLAN Advised the following: 1. *** 2. ***No follow-ups on file.  No orders of the defined types were placed in this encounter.   It has been explained that the patient is to follow regularly with their PCP in addition to all other providers involved in their care and to follow instructions provided by these respective offices. Patient  advised to contact urology clinic if any urologic-pertaining questions, concerns, new symptoms or problems arise in the interim period.  There are no Patient Instructions on file for this visit.  Electronically signed by:  Donnita Falls, FNP   12/24/23    8:41 AM

## 2023-12-29 ENCOUNTER — Telehealth: Payer: Self-pay | Admitting: *Deleted

## 2023-12-29 ENCOUNTER — Other Ambulatory Visit: Payer: Self-pay | Admitting: Gastroenterology

## 2023-12-29 DIAGNOSIS — K219 Gastro-esophageal reflux disease without esophagitis: Secondary | ICD-10-CM

## 2023-12-29 MED ORDER — SPIRONOLACTONE 50 MG PO TABS: 50.0000 mg | ORAL_TABLET | Freq: Every day | ORAL | 11 refills | Status: DC

## 2023-12-29 MED ORDER — PANTOPRAZOLE SODIUM 40 MG PO TBEC: 40.0000 mg | DELAYED_RELEASE_TABLET | Freq: Every day | ORAL | 3 refills | Status: DC

## 2023-12-29 NOTE — Telephone Encounter (Signed)
 Noted.

## 2023-12-29 NOTE — Telephone Encounter (Signed)
 Received refill request for pantoprazole and spironolactone from [facility]. Pt last OV 07/13/2023

## 2023-12-31 ENCOUNTER — Ambulatory Visit: Admitting: Urology

## 2023-12-31 VITALS — BP 115/70 | HR 71

## 2023-12-31 DIAGNOSIS — N2889 Other specified disorders of kidney and ureter: Secondary | ICD-10-CM

## 2023-12-31 NOTE — Progress Notes (Signed)
 12/31/2023 12:32 PM   Ebony Stevens [birth year] [id number]  Referring provider: Benita Stabile, MD [address] [place],  [place] [address]  Right renal mass   HPI: Ebony Stevens is a 72yo here for followup for a right renal mass. MRI shows 3.5cm right renal AML. She denies any flank pain. She denies any hematuria. No other compalints today   PMH: Past Medical History:  Diagnosis Date   Asthma    Chest pain 07/11/2015   Diabetes mellitus without complication (HCC)    Dyspnea 07/11/2015   Early cirrhosis (HCC) 2020   Excessive bleeding in the premenopausal period    Fibromyalgia    GERD (gastroesophageal reflux disease)    HTN (hypertension)    Hypertension    Localized edema    Mixed hyperlipidemia    PONV (postoperative nausea and vomiting)    Sleep apnea    Type 2 diabetes mellitus (HCC)     Surgical History: Past Surgical History:  Procedure Laterality Date   ABDOMINAL HYSTERECTOMY     1975 partial hysterectomy,  1978 "complete hysterctomy and retack bladder". states it didn't take.    BIOPSY  11/10/2018   Procedure: BIOPSY;  Surgeon: Corbin Ade, MD;  [facility];  Service: Endoscopy;;  colon    CHOLECYSTECTOMY     COLONOSCOPY WITH PROPOFOL N/A 11/10/2018   Surgeon: Corbin Ade, MD;  1 hyperplastic polyp, random colon biopsies benign, due for repeat in 2025 due to family history of colon cancer.   DILATION AND CURETTAGE OF UTERUS     ESOPHAGOGASTRODUODENOSCOPY (EGD) WITH PROPOFOL N/A 08/21/2021   Surgeon: Corbin Ade, MD; Mild Schatzki's ring dilated, small hiatal hernia, no portal gastropathy, normal examined duodenum.   MALONEY DILATION  08/21/2021   Procedure: MALONEY DILATION;  Surgeon: Corbin Ade, MD;  [facility];  Service: Endoscopy;;   POLYPECTOMY  11/10/2018   Procedure: POLYPECTOMY;  Surgeon: Corbin Ade, MD;  [facility];  Service: Endoscopy;;  colon    TONSILLECTOMY      Home  Medications:  Allergies as of 12/31/2023   No Known Allergies      Medication List        Accurate as of December 31, 2023 12:32 PM. If you have any questions, ask your nurse or doctor.          acetaminophen 500 MG tablet Commonly known as: TYLENOL Take 500 mg by mouth every 6 (six) hours as needed for moderate pain.   albuterol 108 (90 Base) MCG/ACT inhaler Commonly known as: VENTOLIN HFA Inhale 1-2 puffs into the lungs every 6 (six) hours as needed for wheezing or shortness of breath.   aspirin EC 325 MG tablet Take 325 mg by mouth daily.   atorvastatin 40 MG tablet Commonly known as: LIPITOR Take 40 mg by mouth daily.   CALTRATE 600+D PO Take 1 tablet by mouth daily.   cetirizine 10 MG tablet Commonly known as: ZYRTEC Take 10 mg by mouth daily.   estradiol 1 MG tablet Commonly known as: ESTRACE Take 1 tablet by mouth at bedtime.   furosemide 40 MG tablet Commonly known as: LASIX TAKE ONE TABLET BY MOUTH EVERY OTHER DAY What changed: when to take this   insulin NPH-regular Human (70-30) 100 UNIT/ML injection Inject 60-70 Units into the skin at bedtime.   insulin regular 100 units/mL injection Commonly known as: NOVOLIN R Inject 10 Units into the skin 3 (three) times  daily after meals.   loperamide 2 MG tablet Commonly known as: IMODIUM A-D Take 2 mg by mouth as needed for diarrhea or loose stools.   metFORMIN 500 MG 24 hr tablet Commonly known as: GLUCOPHAGE-XR Take 500 mg by mouth 2 (two) times daily.   metoprolol succinate 25 MG 24 hr tablet Commonly known as: TOPROL-XL Take 1 tablet (25 mg total) by mouth daily.   Mounjaro 5 MG/0.5ML Pen Generic drug: tirzepatide Inject 5 mg into the skin once a week.   olmesartan 40 MG tablet Commonly known as: BENICAR Take 40 mg by mouth daily.   pantoprazole 40 MG tablet Commonly known as: PROTONIX Take 1 tablet (40 mg total) by mouth daily.   spironolactone 50 MG tablet Commonly known as:  Aldactone Take 1 tablet (50 mg total) by mouth daily.        Allergies: No Known Allergies  Family History: Family History  Problem Relation Age of Onset   Cancer Sister        ovarian   Diabetes Brother    Colon cancer Brother 38       Passed from [facility] after 1 year    Social History:  reports that she has never smoked. She has never used smokeless tobacco. She reports that she does not drink alcohol and does not use drugs.  ROS: All other review of systems were reviewed and are negative except what is noted above in HPI  Physical Exam: BP 115/70   Pulse 71   Constitutional:  Alert and oriented, No acute distress. HEENT: [place] AT, moist mucus membranes.  Trachea midline, no masses. Cardiovascular: No clubbing, cyanosis, or edema. Respiratory: Normal respiratory effort, no increased work of breathing. GI: Abdomen is soft, nontender, nondistended, no abdominal masses GU: No CVA tenderness.  Lymph: No cervical or inguinal lymphadenopathy. Skin: No rashes, bruises or suspicious lesions. Neurologic: Grossly intact, no focal deficits, moving all 4 extremities. Psychiatric: Normal mood and affect.  Laboratory Data: Lab Results  Component Value Date   WBC 6.1 08/10/2023   HGB 12.3 08/10/2023   HCT 36.5 08/10/2023   MCV 94 08/10/2023   PLT 201 08/10/2023    Lab Results  Component Value Date   CREATININE 1.42 (H) 08/10/2023    No results found for: "PSA"  No results found for: "TESTOSTERONE"  Lab Results  Component Value Date   HGBA1C 5.9 (H) 07/15/2023    Urinalysis    Component Value Date/Time   COLORURINE STRAW (A) 07/15/2023 1956   APPEARANCEUR Clear 12/15/2023 1351   LABSPEC 1.004 (L) 07/15/2023 1956   PHURINE 5.0 07/15/2023 1956   GLUCOSEU Negative 12/15/2023 1351   HGBUR NEGATIVE 07/15/2023 1956   BILIRUBINUR Negative 12/15/2023 1351   KETONESUR NEGATIVE 07/15/2023 1956   PROTEINUR Negative 12/15/2023 1351   PROTEINUR NEGATIVE 07/15/2023 1956    NITRITE Negative 12/15/2023 1351   NITRITE NEGATIVE 07/15/2023 1956   LEUKOCYTESUR Negative 12/15/2023 1351   LEUKOCYTESUR NEGATIVE 07/15/2023 1956    Lab Results  Component Value Date   LABMICR Comment 12/15/2023    Pertinent Imaging: MRI 11/12/2023: Images reviewed and sicussed with the patient  No results found for this or any previous visit.  No results found for this or any previous visit.  No results found for this or any previous visit.  No results found for this or any previous visit.  No results found for this or any previous visit.  No results found for this or any previous visit.  No results  found for this or any previous visit.  No results found for this or any previous visit.   Assessment & Plan:    1. Right renal mass (Primary) We discussed the natural hx of renal AMLs. We disucssed the treatment options including active surveillance, Renal mass embolization, and partial nephrectomy. After discussing the options the patient elects for embolization. I w.ill refer her to interventional radiology    No follow-ups on file.  Wilkie Aye, MD  [facility] [place]

## 2024-01-03 ENCOUNTER — Other Ambulatory Visit: Payer: Self-pay

## 2024-01-03 ENCOUNTER — Observation Stay (HOSPITAL_COMMUNITY)
Admission: EM | Admit: 2024-01-03 | Discharge: 2024-01-04 | Disposition: A | Discharge status: Home / Self Care | Attending: Internal Medicine | Admitting: Internal Medicine

## 2024-01-03 DIAGNOSIS — J45909 Unspecified asthma, uncomplicated: Secondary | ICD-10-CM | POA: Insufficient documentation

## 2024-01-03 DIAGNOSIS — Z794 Long term (current) use of insulin: Secondary | ICD-10-CM | POA: Insufficient documentation

## 2024-01-03 DIAGNOSIS — R55 Syncope and collapse: Principal | ICD-10-CM | POA: Insufficient documentation

## 2024-01-03 DIAGNOSIS — Z79899 Other long term (current) drug therapy: Secondary | ICD-10-CM | POA: Diagnosis not present

## 2024-01-03 DIAGNOSIS — I110 Hypertensive heart disease with heart failure: Secondary | ICD-10-CM | POA: Diagnosis not present

## 2024-01-03 DIAGNOSIS — Z7984 Long term (current) use of oral hypoglycemic drugs: Secondary | ICD-10-CM | POA: Diagnosis not present

## 2024-01-03 DIAGNOSIS — E785 Hyperlipidemia, unspecified: Secondary | ICD-10-CM | POA: Insufficient documentation

## 2024-01-03 DIAGNOSIS — Z7982 Long term (current) use of aspirin: Secondary | ICD-10-CM | POA: Insufficient documentation

## 2024-01-03 DIAGNOSIS — K7581 Nonalcoholic steatohepatitis (NASH): Secondary | ICD-10-CM | POA: Insufficient documentation

## 2024-01-03 DIAGNOSIS — I5032 Chronic diastolic (congestive) heart failure: Secondary | ICD-10-CM | POA: Insufficient documentation

## 2024-01-03 DIAGNOSIS — K219 Gastro-esophageal reflux disease without esophagitis: Secondary | ICD-10-CM | POA: Diagnosis not present

## 2024-01-03 DIAGNOSIS — E669 Obesity, unspecified: Secondary | ICD-10-CM | POA: Diagnosis not present

## 2024-01-03 DIAGNOSIS — R001 Bradycardia, unspecified: Secondary | ICD-10-CM | POA: Diagnosis not present

## 2024-01-03 DIAGNOSIS — E871 Hypo-osmolality and hyponatremia: Secondary | ICD-10-CM | POA: Diagnosis not present

## 2024-01-03 DIAGNOSIS — E119 Type 2 diabetes mellitus without complications: Secondary | ICD-10-CM | POA: Insufficient documentation

## 2024-01-03 LAB — BASIC METABOLIC PANEL
Anion gap: 14 (ref 5–15)
BUN: 32 mg/dL — ABNORMAL HIGH (ref 8–23)
CO2: 18 mmol/L — ABNORMAL LOW (ref 22–32)
Calcium: 9 mg/dL (ref 8.9–10.3)
Chloride: 97 mmol/L — ABNORMAL LOW (ref 98–111)
Creatinine, Ser: 1.58 mg/dL — ABNORMAL HIGH (ref 0.44–1.00)
GFR, Estimated: 35 mL/min — ABNORMAL LOW (ref >60)
Glucose, Bld: 131 mg/dL — ABNORMAL HIGH (ref 70–99)
Potassium: 4.4 mmol/L (ref 3.5–5.1)
Sodium: 129 mmol/L — ABNORMAL LOW (ref 135–145)

## 2024-01-03 LAB — I-STAT CHEM 8, ED
BUN: 38 mg/dL — ABNORMAL HIGH (ref 8–23)
Calcium, Ion: 0.89 mmol/L — CL (ref 1.15–1.40)
Chloride: 102 mmol/L (ref 98–111)
Creatinine, Ser: 1.5 mg/dL — ABNORMAL HIGH (ref 0.44–1.00)
Glucose, Bld: 118 mg/dL — ABNORMAL HIGH (ref 70–99)
HCT: 31 % — ABNORMAL LOW (ref 36.0–46.0)
Hemoglobin: 10.5 g/dL — ABNORMAL LOW (ref 12.0–15.0)
Potassium: 4.8 mmol/L (ref 3.5–5.1)
Sodium: 130 mmol/L — ABNORMAL LOW (ref 135–145)
TCO2: 17 mmol/L — ABNORMAL LOW (ref 22–32)

## 2024-01-03 LAB — CBC
HCT: 38.4 % (ref 36.0–46.0)
Hemoglobin: 13.3 g/dL (ref 12.0–15.0)
MCH: 30.4 pg (ref 26.0–34.0)
MCHC: 34.6 g/dL (ref 30.0–36.0)
MCV: 87.9 fL (ref 80.0–100.0)
Platelets: 203 10*3/uL (ref 150–400)
RBC: 4.37 MIL/uL (ref 3.87–5.11)
RDW: 13.3 % (ref 11.5–15.5)
WBC: 8.7 10*3/uL (ref 4.0–10.5)
nRBC: 0 % (ref 0.0–0.2)

## 2024-01-03 LAB — CBG MONITORING, ED: Glucose-Capillary: 122 mg/dL — ABNORMAL HIGH (ref 70–99)

## 2024-01-03 LAB — TROPONIN I (HIGH SENSITIVITY)
Troponin I (High Sensitivity): 6 ng/L (ref <18)
Troponin I (High Sensitivity): 5 ng/L (ref <18)

## 2024-01-03 MED ORDER — ASPIRIN 325 MG PO TBEC
325.0000 mg | DELAYED_RELEASE_TABLET | Freq: Every day | ORAL | Status: DC
  Administered 2024-01-03 – 2024-01-04 (×2): 325 mg via ORAL
  Filled 2024-01-03 (×2): qty 1

## 2024-01-03 MED ORDER — ATORVASTATIN CALCIUM 40 MG PO TABS
40.0000 mg | ORAL_TABLET | Freq: Every day | ORAL | Status: DC
  Administered 2024-01-03 – 2024-01-04 (×2): 40 mg via ORAL
  Filled 2024-01-03 (×2): qty 1

## 2024-01-03 MED ORDER — ENOXAPARIN SODIUM 60 MG/0.6ML IJ SOSY
50.0000 mg | PREFILLED_SYRINGE | Freq: Every day | INTRAMUSCULAR | Status: DC
  Administered 2024-01-03: 50 mg via SUBCUTANEOUS
  Filled 2024-01-03 (×2): qty 0.6

## 2024-01-03 MED ORDER — ACETAMINOPHEN 650 MG RE SUPP: 650.0000 mg | Freq: Four times a day (QID) | RECTAL | Status: DC | PRN

## 2024-01-03 MED ORDER — ONDANSETRON HCL 4 MG/2ML IJ SOLN: 4.0000 mg | Freq: Four times a day (QID) | INTRAMUSCULAR | Status: DC | PRN

## 2024-01-03 MED ORDER — SPIRONOLACTONE 25 MG PO TABS
50.0000 mg | ORAL_TABLET | Freq: Every day | ORAL | Status: DC
Start: 2024-01-04 — End: 2024-01-04
  Administered 2024-01-04: 50 mg via ORAL
  Filled 2024-01-03: qty 2

## 2024-01-03 MED ORDER — FUROSEMIDE 40 MG PO TABS: 40.0000 mg | ORAL_TABLET | ORAL | Status: DC

## 2024-01-03 MED ORDER — ONDANSETRON HCL 4 MG PO TABS: 4.0000 mg | ORAL_TABLET | Freq: Four times a day (QID) | ORAL | Status: DC | PRN

## 2024-01-03 MED ORDER — IRBESARTAN 150 MG PO TABS
150.0000 mg | ORAL_TABLET | Freq: Every day | ORAL | Status: DC
  Administered 2024-01-04: 150 mg via ORAL
  Filled 2024-01-03: qty 1

## 2024-01-03 MED ORDER — SODIUM CHLORIDE 0.9% FLUSH
3.0000 mL | Freq: Once | INTRAVENOUS | Status: AC
  Administered 2024-01-03: 3 mL via INTRAVENOUS

## 2024-01-03 MED ORDER — ACETAMINOPHEN 325 MG PO TABS: 650.0000 mg | ORAL_TABLET | Freq: Four times a day (QID) | ORAL | Status: DC | PRN

## 2024-01-03 MED ORDER — PANTOPRAZOLE SODIUM 40 MG PO TBEC
40.0000 mg | DELAYED_RELEASE_TABLET | Freq: Every day | ORAL | Status: DC
  Administered 2024-01-04: 40 mg via ORAL
  Filled 2024-01-03 (×2): qty 1

## 2024-01-03 MED ORDER — SODIUM CHLORIDE 0.9 % IV SOLN
INTRAVENOUS | Status: DC
Start: 2024-01-03 — End: 2024-01-04
  Administered 2024-01-03: 999 mL via INTRAVENOUS

## 2024-01-03 NOTE — ED Triage Notes (Signed)
 Patient to ED from ICU. Patient was visiting her husband headed to cafeteria become light headed and had syncopal episode Patient is brady and c/o nausea.

## 2024-01-03 NOTE — ED Notes (Signed)
 Patient refused Protonix states she takes in AM and took this AM

## 2024-01-03 NOTE — Significant Event (Signed)
 Skiler was in ICU visiting her husband, Eulamae, went to get food from the cafeteria on return, staff noted her slumped in the elevator still standing but leaned against the wall and dropping her plate of food, eyes open but not responding to her name. Staff assisted her down and she was placed into a wheelchair. Patient was syncopal not responding to pain or voice briefly. She initially was unable to sit up but came to and we noted pulse was bradycardic and thready. ED called for a stretcher. CBG taken- 95. Vital signs taken patient was bradycardic (50-38), and hypotensive (systolics 90-70). Patient initially able to state name and that she is on beta blockers and that she has been bradycardic in the past, but consciousness waned throughout transport to the ED. Husband made aware of his wife's transport to ED.

## 2024-01-03 NOTE — ED Notes (Signed)
 Patient resting in bed.

## 2024-01-03 NOTE — ED Notes (Signed)
 ED TO INPATIENT HANDOFF REPORT  Name/Age/Gender Ebony Stevens 73 y.o. female  Code Status    Code Status Orders  (From admission, onward)           Start     Ordered   01/03/24 1703  Full code  Continuous       Question:  By:  Answer:  Consent: discussion documented in EHR   01/03/24 1703           Code Status History     Date Active Date Inactive Code Status Order ID Comments User Context   07/15/2023 2227 07/19/2023 1602 Full Code [id number]  Frankey Shown, DO ED       Home/SNF/Other Home  Chief Complaint Syncope [R55]  Level of Care/Admitting Diagnosis ED Disposition     ED Disposition  Admit   Condition  --   Comment  Hospital Area: [facility] [[id number]]  Level of Care: Telemetry [5]  Admit to tele based on following criteria: Eval of Syncope  May place patient in observation at [facility] or [facility] if equivalent level of care is available:: No  Covid Evaluation: Asymptomatic - no recent exposure (last 10 days) testing not required  Diagnosis: Syncope [206001]  Admitting Physician: Alan Mulder [[id number]]  Attending Physician: Alan Mulder [[id number]]          Medical History Past Medical History:  Diagnosis Date   Asthma    Chest pain 07/11/2015   Diabetes mellitus without complication (HCC)    Dyspnea 07/11/2015   Early cirrhosis (HCC) 2020   Excessive bleeding in the premenopausal period    Fibromyalgia    GERD (gastroesophageal reflux disease)    HTN (hypertension)    Hypertension    Localized edema    Mixed hyperlipidemia    PONV (postoperative nausea and vomiting)    Sleep apnea    Type 2 diabetes mellitus (HCC)     Allergies No Known Allergies  IV Location/Drains/Wounds Patient Lines/Drains/Airways Status     Active Line/Drains/Airways     Name Placement date Placement time Site Days   Peripheral IV 01/03/24 20 G 1" Left Antecubital 01/03/24  1342  Antecubital  less than 1             Labs/Imaging Results for orders placed or performed during the hospital encounter of 01/03/24 (from the past 48 hours)  Basic metabolic panel     Status: Abnormal   Collection Time: 01/03/24  1:33 PM  Result Value Ref Range   Sodium 129 (L) 135 - 145 mmol/L   Potassium 4.4 3.5 - 5.1 mmol/L   Chloride 97 (L) 98 - 111 mmol/L   CO2 18 (L) 22 - 32 mmol/L   Glucose, Bld 131 (H) 70 - 99 mg/dL    Comment: Glucose reference range applies only to samples taken after fasting for at least 8 hours.   BUN 32 (H) 8 - 23 mg/dL   Creatinine, Ser 6.21 (H) 0.44 - 1.00 mg/dL   Calcium 9.0 8.9 - 30.8 mg/dL   GFR, Estimated 35 (L) >60 mL/min    Comment: (NOTE) Calculated using the CKD-EPI Creatinine Equation (2021)    Anion gap 14 5 - 15    Comment: Performed at [facility], [address], [place], [place] [address]  CBC     Status: None   Collection Time: 01/03/24  1:33 PM  Result Value Ref Range   WBC 8.7 4.0 - 10.5 K/uL   RBC  4.37 3.87 - 5.11 MIL/uL   Hemoglobin 13.3 12.0 - 15.0 g/dL   HCT 16.1 09.6 - 04.5 %   MCV 87.9 80.0 - 100.0 fL   MCH 30.4 26.0 - 34.0 pg   MCHC 34.6 30.0 - 36.0 g/dL   RDW 40.9 81.1 - 91.4 %   Platelets 203 150 - 400 K/uL   nRBC 0.0 0.0 - 0.2 %    Comment: Performed at [facility], [address] [place], [place] [address]  CBG monitoring, ED     Status: Abnormal   Collection Time: 01/03/24  1:33 PM  Result Value Ref Range   Glucose-Capillary 122 (H) 70 - 99 mg/dL    Comment: Glucose reference range applies only to samples taken after fasting for at least 8 hours.  Troponin I (High Sensitivity)     Status: None   Collection Time: 01/03/24  2:01 PM  Result Value Ref Range   Troponin I (High Sensitivity) 6 <18 ng/L    Comment: (NOTE) Elevated high sensitivity troponin I (hsTnI) values and significant  changes across serial measurements may suggest ACS but many other  chronic and acute conditions are known to elevate hsTnI  results.  Refer to the "Links" section for chest pain algorithms and additional  guidance. Performed at [facility], [address], [place], [place] [address]   I-stat chem 8, ed     Status: Abnormal   Collection Time: 01/03/24  2:04 PM  Result Value Ref Range   Sodium 130 (L) 135 - 145 mmol/L   Potassium 4.8 3.5 - 5.1 mmol/L   Chloride 102 98 - 111 mmol/L   BUN 38 (H) 8 - 23 mg/dL   Creatinine, Ser 8.65 (H) 0.44 - 1.00 mg/dL   Glucose, Bld 784 (H) 70 - 99 mg/dL    Comment: Glucose reference range applies only to samples taken after fasting for at least 8 hours.   Calcium, Ion 0.89 (LL) 1.15 - 1.40 mmol/L   TCO2 17 (L) 22 - 32 mmol/L   Hemoglobin 10.5 (L) 12.0 - 15.0 g/dL   HCT 69.6 (L) 29.5 - 28.4 %   Comment NOTIFIED PHYSICIAN   Troponin I (High Sensitivity)     Status: None   Collection Time: 01/03/24  4:05 PM  Result Value Ref Range   Troponin I (High Sensitivity) 5 <18 ng/L    Comment: (NOTE) Elevated high sensitivity troponin I (hsTnI) values and significant  changes across serial measurements may suggest ACS but many other  chronic and acute conditions are known to elevate hsTnI results.  Refer to the "Links" section for chest pain algorithms and additional  guidance. Performed at [facility], [address] [place], [place] [address]    No results found.  Pending Labs Unresulted Labs (From admission, onward)     Start     Ordered   01/04/24 0500  Basic metabolic panel  Tomorrow morning,   R        01/03/24 1703   01/04/24 0500  CBC  Tomorrow morning,   R        01/03/24 1703   01/03/24 1341  Urinalysis, Routine w reflex microscopic -Urine, Clean Catch  Once,   URGENT       Question:  Specimen Source  Answer:  Urine, Clean Catch   01/03/24 1341            Vitals/Pain Today's Vitals   01/03/24 1630 01/03/24 1700 01/03/24 1815 01/03/24  1832  BP: (!) 136/51 (!) 141/44 (!) 126/45   Pulse: (!) 47 (!) 46 (!) 45    Resp: 13 18 20    Temp:    (!) 97.5 F (36.4 C)  SpO2: 98% 100% 98%   Weight:      Height:      PainSc:        Isolation Precautions No active isolations  Medications Medications  0.9 %  sodium chloride infusion (999 mLs Intravenous New Bag/Given 01/03/24 1605)  aspirin EC tablet 325 mg (325 mg Oral Given 01/03/24 1728)  atorvastatin (LIPITOR) tablet 40 mg (40 mg Oral Given 01/03/24 1728)  furosemide (LASIX) tablet 40 mg (has no administration in time range)  irbesartan (AVAPRO) tablet 150 mg (has no administration in time range)  spironolactone (ALDACTONE) tablet 50 mg (has no administration in time range)  pantoprazole (PROTONIX) EC tablet 40 mg (40 mg Oral Patient Refused/Not Given 01/03/24 1728)  enoxaparin (LOVENOX) injection 50 mg (has no administration in time range)  acetaminophen (TYLENOL) tablet 650 mg (has no administration in time range)    Or  acetaminophen (TYLENOL) suppository 650 mg (has no administration in time range)  ondansetron (ZOFRAN) tablet 4 mg (has no administration in time range)    Or  ondansetron (ZOFRAN) injection 4 mg (has no administration in time range)  sodium chloride flush (NS) 0.9 % injection 3 mL (3 mLs Intravenous Given 01/03/24 1355)    Mobility walks with person assist

## 2024-01-03 NOTE — ED Provider Notes (Signed)
 [place] EMERGENCY DEPARTMENT AT [facility] Provider Note   CSN: [id number] Arrival date & time: 01/03/24  1329     History  Chief Complaint  Patient presents with   Bradycardia   Near Syncope    Ebony Stevens is a 73 y.o. female.  73 year old female presents with syncope.  Patient states that she was in the elevator today and became dizzy and helped herself to the ground.  Denied any headache or chest pain prior to the event.  No prior history of syncope.  Patient is unsure if she has had bradycardia before in the past.  No new medications noted.  Complains of diffuse weakness at this time.  She has not been short of breath.       Home Medications Prior to Admission medications   Medication Sig Start Date End Date Taking? Authorizing Provider  acetaminophen (TYLENOL) 500 MG tablet Take 500 mg by mouth every 6 (six) hours as needed for moderate pain.    [provider]  albuterol (VENTOLIN HFA) 108 (90 Base) MCG/ACT inhaler Inhale 1-2 puffs into the lungs every 6 (six) hours as needed for wheezing or shortness of breath.    [provider]  aspirin EC 325 MG tablet Take 325 mg by mouth daily.    [provider]  atorvastatin (LIPITOR) 40 MG tablet Take 40 mg by mouth daily. 09/20/20   [provider]  Calcium Carbonate-Vitamin D (CALTRATE 600+D PO) Take 1 tablet by mouth daily.    [provider]  cetirizine (ZYRTEC) 10 MG tablet Take 10 mg by mouth daily.    [provider]  estradiol (ESTRACE) 1 MG tablet Take 1 tablet by mouth at bedtime. 03/27/19   [provider]  furosemide (LASIX) 40 MG tablet TAKE ONE TABLET BY MOUTH EVERY OTHER DAY Patient taking differently: Take 40 mg by mouth daily. 05/28/17   Pricilla Riffle, MD  insulin NPH-regular Human (70-30) 100 UNIT/ML injection Inject 60-70 Units into the skin at bedtime. Patient not taking: Reported on 07/15/2023    [provider]  insulin  regular (NOVOLIN R) 100 units/mL injection Inject 10 Units into the skin 3 (three) times daily after meals. Patient not taking: Reported on 07/15/2023    [provider]  loperamide (IMODIUM A-D) 2 MG tablet Take 2 mg by mouth as needed for diarrhea or loose stools.    [provider]  metFORMIN (GLUCOPHAGE-XR) 500 MG 24 hr tablet Take 500 mg by mouth 2 (two) times daily.    [provider]  metoprolol succinate (TOPROL-XL) 25 MG 24 hr tablet Take 1 tablet (25 mg total) by mouth daily. 07/18/23 09/16/23  Sherryll Burger, Pratik D, DO  MOUNJARO 5 MG/0.5ML Pen Inject 5 mg into the skin once a week. 05/26/23   [provider]  olmesartan (BENICAR) 40 MG tablet Take 40 mg by mouth daily. 11/28/23   [provider]  pantoprazole (PROTONIX) 40 MG tablet Take 1 tablet (40 mg total) by mouth daily. 12/29/23   Aida Raider, NP  spironolactone (ALDACTONE) 50 MG tablet Take 1 tablet (50 mg total) by mouth daily. 12/29/23 12/28/24  Aida Raider, NP      Allergies    Patient has no known allergies.    Review of Systems   Review of Systems  All other systems reviewed and are negative.   Physical Exam Updated Vital Signs BP (!) 125/44   Pulse (!) 42   Resp 18  Ht 1.575 m (5\' 2" )   Wt 101 kg   SpO2 96%   BMI 40.73 kg/m  Physical Exam Vitals and nursing note reviewed.  Constitutional:      General: She is not in acute distress.    Appearance: Normal appearance. She is well-developed. She is not toxic-appearing.  HENT:     Head: Normocephalic and atraumatic.  Eyes:     General: Lids are normal.     Conjunctiva/sclera: Conjunctivae normal.     Pupils: Pupils are equal, round, and reactive to light.  Neck:     Thyroid: No thyroid mass.     Trachea: No tracheal deviation.  Cardiovascular:     Rate and Rhythm: Regular rhythm. Bradycardia present.     Heart sounds: Normal heart sounds. No murmur heard.    No gallop.  Pulmonary:     Effort: Pulmonary  effort is normal. No respiratory distress.     Breath sounds: Normal breath sounds. No stridor. No decreased breath sounds, wheezing, rhonchi or rales.  Abdominal:     General: There is no distension.     Palpations: Abdomen is soft.     Tenderness: There is no abdominal tenderness. There is no rebound.  Musculoskeletal:        General: No tenderness. Normal range of motion.     Cervical back: Normal range of motion and neck supple.  Skin:    General: Skin is warm and dry.     Findings: No abrasion or rash.  Neurological:     Mental Status: She is alert and oriented to person, place, and time. Mental status is at baseline.     GCS: GCS eye subscore is 4. GCS verbal subscore is 5. GCS motor subscore is 6.     Cranial Nerves: No cranial nerve deficit.     Sensory: No sensory deficit.     Motor: Motor function is intact.  Psychiatric:        Attention and Perception: Attention normal.        Speech: Speech normal.        Behavior: Behavior normal.     ED Results / Procedures / Treatments   Labs (all labs ordered are listed, but only abnormal results are displayed) Labs Reviewed  CBC  BASIC METABOLIC PANEL  URINALYSIS, ROUTINE W REFLEX MICROSCOPIC  CBG MONITORING, ED  I-STAT CHEM 8, ED  TROPONIN I (HIGH SENSITIVITY)    EKG None  ED ECG REPORT   Date: 01/03/2024  Rate: 42  Rhythm: sinus bradycardia  QRS Axis: normal  Intervals: normal  ST/T Wave abnormalities: normal  Conduction Disutrbances:none  Narrative Interpretation:   Old EKG Reviewed: none available  I have personally reviewed the EKG tracing and agree with the computerized printout as noted.   Radiology No results found.  Procedures Procedures    Medications Ordered in ED Medications  0.9 %  sodium chloride infusion (has no administration in time range)  sodium chloride flush (NS) 0.9 % injection 3 mL (3 mLs Intravenous Given 01/03/24 1355)    ED Course/ Medical Decision Making/ A&P                                  Medical Decision Making Amount and/or Complexity of Data Reviewed Labs: ordered.  Risk Prescription drug management.   Patient's EKG shows sinus bradycardia.  These results were reviewed with Dr. Bufford Buttner from cardiology.  Due to patient's  recent syncopal event which was likely vagal but could have been some other underlying cardiac arrhythmia, he agrees that patient will need to be admitted for observation.  Will consult hospitalist team        Final Clinical Impression(s) / ED Diagnoses Final diagnoses:  None    Rx / DC Orders ED Discharge Orders     None         Lorre Nick, MD 01/03/24 1546

## 2024-01-03 NOTE — Significant Event (Signed)
 Rapid Response Event Note   Reason for Call :  Patient unconscious in elevator   Initial Focused Assessment:  Patient unconscious in elevator, this RN and ICU staff assisted patient on to wheel chair. HR in 40's, BP 64/47, CBG 95. Patient extremely diaphoretic. Patient regained consciousness but remained lethargic. ED Charge notified to assist with transport to [facility]. This RN, Judeth [facility] PA, and Lexi NT assisted with transport.   Husband who is a patient in ICU was notified by myself and her sister was also called and a voicemail was left notifying her of the event and to please call back.    Teresita Madura, RN

## 2024-01-03 NOTE — H&P (Signed)
 History and Physical    Ebony Stevens:[id number] DOB: Nov 29, 1950 DOA: 01/03/2024  PCP: Benita Stabile, MD   Chief Complaint: syncope  HPI: Ebony Stevens is a 73 y.o. female with medical history significant of diabetes, hypertension, GERD, hyperlipidemia who presented to the emergency department due to syncope.  Patient was in the hospital visiting family when she became dizzy and lightheaded.  She states that everything started to become dark.  She eventually passed out and became weak so she was brought to the ER for further assessment.  On arrival she was bradycardic with heart rates in the 40s.  EKG showed sinus bradycardia.  Labs were obtained on presentation which showed creatinine 1.58 at baseline, bicarb 18, sodium 129, WBC 8.7, hemoglobin 13.3, sodium 122, troponin 6, 5.  Patient was given IV fluids and admitted for observation due to bradycardia.  She is also had an echocardiogram in October which demonstrated EF 60 to 65% with normal right ventricular function.   Review of Systems: Review of Systems  Constitutional: Negative.  Negative for chills and fever.  HENT: Negative.    Eyes: Negative.   Respiratory: Negative.    Cardiovascular: Negative.   Gastrointestinal: Negative.   Genitourinary: Negative.   Musculoskeletal: Negative.   Skin: Negative.   Neurological: Negative.   Endo/Heme/Allergies: Negative.   Psychiatric/Behavioral: Negative.       As per HPI otherwise 10 point review of systems negative.   No Known Allergies  Past Medical History:  Diagnosis Date   Asthma    Chest pain 07/11/2015   Diabetes mellitus without complication (HCC)    Dyspnea 07/11/2015   Early cirrhosis (HCC) 2020   Excessive bleeding in the premenopausal period    Fibromyalgia    GERD (gastroesophageal reflux disease)    HTN (hypertension)    Hypertension    Localized edema    Mixed hyperlipidemia    PONV (postoperative nausea and vomiting)    Sleep apnea    Type 2 diabetes  mellitus (HCC)     Past Surgical History:  Procedure Laterality Date   ABDOMINAL HYSTERECTOMY     1975 partial hysterectomy,  1978 "complete hysterctomy and retack bladder". states it didn't take.    BIOPSY  11/10/2018   Procedure: BIOPSY;  Surgeon: Corbin Ade, MD;  Location: [facility];  Service: Endoscopy;;  colon    CHOLECYSTECTOMY     COLONOSCOPY WITH PROPOFOL N/A 11/10/2018   Surgeon: Corbin Ade, MD;  1 hyperplastic polyp, random colon biopsies benign, due for repeat in 2025 due to family history of colon cancer.   DILATION AND CURETTAGE OF UTERUS     ESOPHAGOGASTRODUODENOSCOPY (EGD) WITH PROPOFOL N/A 08/21/2021   Surgeon: Corbin Ade, MD; Mild Schatzki's ring dilated, small hiatal hernia, no portal gastropathy, normal examined duodenum.   MALONEY DILATION  08/21/2021   Procedure: MALONEY DILATION;  Surgeon: Corbin Ade, MD;  Location: [facility];  Service: Endoscopy;;   POLYPECTOMY  11/10/2018   Procedure: POLYPECTOMY;  Surgeon: Corbin Ade, MD;  Location: [facility];  Service: Endoscopy;;  colon    TONSILLECTOMY       reports that she has never smoked. She has never used smokeless tobacco. She reports that she does not drink alcohol and does not use drugs.  Family History  Problem Relation Age of Onset   Cancer Sister        ovarian   Diabetes Brother    Colon cancer Brother 86  Passed from [facility] after 1 year    Prior to Admission medications   Medication Sig Start Date End Date Taking? Authorizing Provider  acetaminophen (TYLENOL) 500 MG tablet Take 500 mg by mouth every 6 (six) hours as needed for moderate pain.   Yes [provider]  albuterol (VENTOLIN HFA) 108 (90 Base) MCG/ACT inhaler Inhale 1-2 puffs into the lungs every 6 (six) hours as needed for wheezing or shortness of breath.   Yes [provider]  aspirin EC 325 MG tablet Take 325 mg by mouth daily.   Yes [provider]  atorvastatin (LIPITOR) 40  MG tablet Take 40 mg by mouth daily. 09/20/20  Yes [provider]  Calcium Carbonate-Vitamin D (CALTRATE 600+D PO) Take 1 tablet by mouth daily.   Yes [provider]  cetirizine (ZYRTEC) 10 MG tablet Take 10 mg by mouth daily.   Yes [provider]  estradiol (ESTRACE) 1 MG tablet Take 1 tablet by mouth at bedtime. 03/27/19  Yes [provider]  furosemide (LASIX) 40 MG tablet TAKE ONE TABLET BY MOUTH EVERY OTHER DAY Patient taking differently: Take 40 mg by mouth daily. 05/28/17  Yes Pricilla Riffle, MD  loperamide (IMODIUM A-D) 2 MG tablet Take 2 mg by mouth as needed for diarrhea or loose stools.   Yes [provider]  metFORMIN (GLUCOPHAGE-XR) 500 MG 24 hr tablet Take 500 mg by mouth 2 (two) times daily.   Yes [provider]  metoprolol succinate (TOPROL-XL) 25 MG 24 hr tablet Take 1 tablet (25 mg total) by mouth daily. 07/18/23 01/03/24 Yes Shah, Pratik D, DO  MOUNJARO 5 MG/0.5ML Pen Inject 5 mg into the skin once a week. 05/26/23  Yes [provider]  olmesartan (BENICAR) 40 MG tablet Take 40 mg by mouth daily. 11/28/23  Yes [provider]  pantoprazole (PROTONIX) 40 MG tablet Take 1 tablet (40 mg total) by mouth daily. 12/29/23  Yes Mahon, Frederik Schmidt, NP  spironolactone (ALDACTONE) 50 MG tablet Take 1 tablet (50 mg total) by mouth daily. 12/29/23 12/28/24 Yes Mahon, Frederik Schmidt, NP  insulin NPH-regular Human (70-30) 100 UNIT/ML injection Inject 60-70 Units into the skin at bedtime. Patient not taking: Reported on 07/15/2023    [provider]  insulin regular (NOVOLIN R) 100 units/mL injection Inject 10 Units into the skin 3 (three) times daily after meals. Patient not taking: Reported on 07/15/2023    [provider]    Physical Exam: Vitals:   01/03/24 1832 01/03/24 1900 01/03/24 1955 01/03/24 2054  BP:    (!) 138/49  Pulse:  (!) 47 (!) 54 (!) 47  Resp:  15 (!) 24 20  Temp: (!) 97.5 F (36.4 C)   98 F  (36.7 C)  TempSrc:    Oral  SpO2:  100% 97% 97%  Weight:      Height:       Physical Exam Vitals reviewed.  Constitutional:      Appearance: She is normal weight.  HENT:     Head: Normocephalic.     Nose: Nose normal.     Mouth/Throat:     Mouth: Mucous membranes are moist.     Pharynx: Oropharynx is clear.  Cardiovascular:     Rate and Rhythm: Bradycardia present.  Pulmonary:     Effort: Pulmonary effort is normal.     Breath sounds: Normal breath sounds.  Abdominal:     General: Abdomen is flat. Bowel sounds are normal.  Musculoskeletal:  General: Normal range of motion.     Cervical back: Normal range of motion.  Skin:    General: Skin is warm.     Coloration: Skin is not jaundiced.  Neurological:     General: No focal deficit present.     Mental Status: She is alert and oriented to person, place, and time. Mental status is at baseline.  Psychiatric:        Mood and Affect: Mood normal.        Labs on Admission: I have personally reviewed the patients's labs and imaging studies.  Assessment/Plan Principal Problem:   Syncope   # Syncope, most likely vasovagal - Patient has sinus bradycardia - Endorsing lightheadedness upon standing with evidence of dehydration on examination review of prior encounters shows intermittent bradycardia during prior hospitalization  Plan: Obtain echocardiogram Monitor on telemetry  # Hyponatremia-continue to monitor.  Likely hypovolemic.  Will give IV fluids overnight.  # Chronic diastolic heart failure-patient is on exacerbation.  Continue home Lasix  # Hypertension-continue sartan  # Hyperlipidemia-continue Lipitor  # GERD-continue Protonix  # Type 2 diabetes-patient is on basal bolus insulin at home.  Will continue.  # NASH cirrhosis-continue Lasix and spironolactone  # Obesity-encourage weight loss.   Admission status: Observation Telemetry  Certification: The appropriate patient status for this patient  is OBSERVATION. Observation status is judged to be reasonable and necessary in order to provide the required intensity of service to ensure the patient's safety. The patient's presenting symptoms, physical exam findings, and initial radiographic and laboratory data in the context of their medical condition is felt to place them at decreased risk for further clinical deterioration. Furthermore, it is anticipated that the patient will be medically stable for discharge from the hospital within 2 midnights of admission.     Alan Mulder MD [facility] If 7PM-7AM, please contact night-coverage [email or web address]  01/03/2024, 9:37 PM

## 2024-01-03 NOTE — ED Notes (Signed)
 Enid Derry RN ICU is contacting pts sister Dianne Dun to let her know what is going on.

## 2024-01-04 ENCOUNTER — Encounter (HOSPITAL_COMMUNITY): Payer: Self-pay | Admitting: Internal Medicine

## 2024-01-04 DIAGNOSIS — R001 Bradycardia, unspecified: Secondary | ICD-10-CM | POA: Diagnosis not present

## 2024-01-04 LAB — BASIC METABOLIC PANEL
Anion gap: 9 (ref 5–15)
BUN: 24 mg/dL — ABNORMAL HIGH (ref 8–23)
CO2: 18 mmol/L — ABNORMAL LOW (ref 22–32)
Calcium: 8.3 mg/dL — ABNORMAL LOW (ref 8.9–10.3)
Chloride: 102 mmol/L (ref 98–111)
Creatinine, Ser: 0.99 mg/dL (ref 0.44–1.00)
GFR, Estimated: >60 mL/min (ref >60)
Glucose, Bld: 84 mg/dL (ref 70–99)
Potassium: 3.9 mmol/L (ref 3.5–5.1)
Sodium: 129 mmol/L — ABNORMAL LOW (ref 135–145)

## 2024-01-04 LAB — CBC
HCT: 35.9 % — ABNORMAL LOW (ref 36.0–46.0)
Hemoglobin: 12.1 g/dL (ref 12.0–15.0)
MCH: 30.3 pg (ref 26.0–34.0)
MCHC: 33.7 g/dL (ref 30.0–36.0)
MCV: 90 fL (ref 80.0–100.0)
Platelets: 151 10*3/uL (ref 150–400)
RBC: 3.99 MIL/uL (ref 3.87–5.11)
RDW: 13.2 % (ref 11.5–15.5)
WBC: 5.4 10*3/uL (ref 4.0–10.5)
nRBC: 0 % (ref 0.0–0.2)

## 2024-01-04 LAB — GLUCOSE, CAPILLARY: Glucose-Capillary: 95 mg/dL (ref 70–99)

## 2024-01-04 NOTE — Care Management Obs Status (Signed)
 MEDICARE OBSERVATION STATUS NOTIFICATION   Patient Details  Name: Ebony Stevens MRN: [id number] Date of Birth: 03-Apr-1951   Medicare Observation Status Notification Given:  Yes    Ewing Schlein, LCSW 01/04/2024, 4:05 PM

## 2024-01-04 NOTE — Plan of Care (Signed)

## 2024-01-04 NOTE — Progress Notes (Signed)
   01/04/24 0856  TOC Brief Assessment  Insurance and Status Reviewed  Patient has primary care physician Yes  Home environment has been reviewed Resides in single family home with husband  Prior level of function: Independent with ADLs at baseline  Prior/Current Home Services No current home services  Social Drivers of Health Review SDOH reviewed no interventions necessary  Readmission risk has been reviewed Yes  Transition of care needs no transition of care needs at this time

## 2024-01-04 NOTE — Discharge Summary (Signed)
 Physician Discharge Summary  Ebony Stevens:[id number] DOB: 06-12-1951 DOA: 01/03/2024  PCP: Benita Stabile, MD  Admit date: 01/03/2024 Discharge date: 01/04/2024  Admitted From:home Disposition:  home Recommendations for Outpatient Follow-up:  Follow up with PCP in 1-2 weeks Please obtain BMP/CBC in one week  Home Health: None Equipment/Devices: None Discharge Condition: Stable CODE STATUS: Full code Diet recommendation: Cardiac Brief/Interim Summary:  73 y.o. female with medical history significant of diabetes, hypertension, GERD, hyperlipidemia who presented to the emergency department due to syncope.  Patient was in the hospital visiting family when she became dizzy and lightheaded.  She states that everything started to become dark.  She eventually passed out and became weak so she was brought to the ER for further assessment.  On arrival she was bradycardic with heart rates in the 40s.  EKG showed sinus bradycardia.  Labs were obtained on presentation which showed creatinine 1.58 at baseline, bicarb 18, sodium 129, WBC 8.7, hemoglobin 13.3, sodium 122, troponin 6, 5.  Patient was given IV fluids and admitted for observation due to bradycardia.  She is also had an echocardiogram in October which demonstrated EF 60 to 65% with normal right ventricular function.    Discharge Diagnoses:  Principal Problem:   Syncope    # Syncope, most likely vasovagal.  Her husband is admitted in the ICU she has been staying overnight with him for many days and has really not had sleep or rest.  She is really stressed out and anxious.  She was also bradycardic and hyponatremic on admission.  Hyponatremia has been ongoing for the past 9 months.  She was given IV normal saline with improvement in sodium up to 129 at the time of discharge.  EKG shows sinus bradycardia, Toprol was stopped on discharge.  She was continued on Avapro and Lasix and Aldactone.  She was asked to follow-up with PCP in 1 week.   Ambulated in the hallway prior to discharge without dizziness or orthostasis.   # Hyponatremia-chronic hyponatremia with sodium improved to 129 with normal saline.  # Chronic diastolic heart failure-  Continue home Lasix   # Hypertension-continue losartan   # Hyperlipidemia-continue Lipitor   # GERD-continue Protonix   # Type 2 diabetes-continue Glucophage, Mounjaro, insulin as per prior to admission   # NASH cirrhosis-continue Lasix and spironolactone   # Obesity-encourage weight loss.  Estimated body mass index is 40.73 kg/m as calculated from the following:   Height as of this encounter: 5\' 2"  (1.575 m).   Weight as of this encounter: 101 kg.  Discharge Instructions  Discharge Instructions     Diet - low sodium heart healthy   Complete by: As directed    Increase activity slowly   Complete by: As directed    No wound care   Complete by: As directed       Allergies as of 01/04/2024   No Known Allergies      Medication List     STOP taking these medications    insulin NPH-regular Human (70-30) 100 UNIT/ML injection   insulin regular 100 units/mL injection Commonly known as: NOVOLIN R   metoprolol succinate 25 MG 24 hr tablet Commonly known as: TOPROL-XL       TAKE these medications    acetaminophen 500 MG tablet Commonly known as: TYLENOL Take 500 mg by mouth every 6 (six) hours as needed for moderate pain.   albuterol 108 (90 Base) MCG/ACT inhaler Commonly known as: VENTOLIN HFA Inhale 1-2 puffs  into the lungs every 6 (six) hours as needed for wheezing or shortness of breath.   aspirin EC 325 MG tablet Take 325 mg by mouth daily.   atorvastatin 40 MG tablet Commonly known as: LIPITOR Take 40 mg by mouth daily.   CALTRATE 600+D PO Take 1 tablet by mouth daily.   cetirizine 10 MG tablet Commonly known as: ZYRTEC Take 10 mg by mouth daily.   estradiol 1 MG tablet Commonly known as: ESTRACE Take 1 tablet by mouth at bedtime.    furosemide 40 MG tablet Commonly known as: LASIX TAKE ONE TABLET BY MOUTH EVERY OTHER DAY What changed: when to take this   loperamide 2 MG tablet Commonly known as: IMODIUM A-D Take 2 mg by mouth as needed for diarrhea or loose stools.   metFORMIN 500 MG 24 hr tablet Commonly known as: GLUCOPHAGE-XR Take 500 mg by mouth 2 (two) times daily.   Mounjaro 5 MG/0.5ML Pen Generic drug: tirzepatide Inject 5 mg into the skin once a week.   olmesartan 40 MG tablet Commonly known as: BENICAR Take 40 mg by mouth daily.   pantoprazole 40 MG tablet Commonly known as: PROTONIX Take 1 tablet (40 mg total) by mouth daily.   spironolactone 50 MG tablet Commonly known as: Aldactone Take 1 tablet (50 mg total) by mouth daily.        Follow-up Information     Benita Stabile, MD Follow up.   Specialty: Internal Medicine Why: metoprolol was stopped due to bradycardia and syncope Contact information: [address] [place] [address] [phone]                No Known Allergies  Consultations: None   Procedures/Studies: No results found. (Echo, Carotid, EGD, Colonoscopy, ERCP)    Subjective: She is tired but anxious to go home husband is still in the ICU  Discharge Exam: Vitals:   01/04/24 1123 01/04/24 1247  BP: (!) 152/84 (!) 162/49  Pulse: (!) 47 (!) 50  Resp:  19  Temp:  98.1 F (36.7 C)  SpO2:  100%   Vitals:   01/04/24 1116 01/04/24 1122 01/04/24 1123 01/04/24 1247  BP: (!) 158/84 (!) 152/84 (!) 152/84 (!) 162/49  Pulse: (!) 52 (!) 47 (!) 47 (!) 50  Resp:    19  Temp:    98.1 F (36.7 C)  TempSrc:    Oral  SpO2:    100%  Weight:      Height:        General: Pt is alert, awake, not in acute distress Cardiovascular: RRR, S1/S2 +, no rubs, no gallops Respiratory: CTA bilaterally, no wheezing, no rhonchi Abdominal: Soft, NT, ND, bowel sounds + Extremities: no edema, no cyanosis    The results of significant diagnostics from this  hospitalization (including imaging, microbiology, ancillary and laboratory) are listed below for reference.     Microbiology: No results found for this or any previous visit (from the past 240 hours).   Labs: BNP (last 3 results) No results for input(s): "BNP" in the last 8760 hours. Basic Metabolic Panel: Recent Labs  Lab 01/03/24 1333 01/03/24 1404 01/04/24 0524  NA 129* 130* 129*  K 4.4 4.8 3.9  CL 97* 102 102  CO2 18*  --  18*  GLUCOSE 131* 118* 84  BUN 32* 38* 24*  CREATININE 1.58* 1.50* 0.99  CALCIUM 9.0  --  8.3*   Liver Function Tests: No results for input(s): "AST", "ALT", "ALKPHOS", "BILITOT", "  PROT", "ALBUMIN" in the last 168 hours. No results for input(s): "LIPASE", "AMYLASE" in the last 168 hours. No results for input(s): "AMMONIA" in the last 168 hours. CBC: Recent Labs  Lab 01/03/24 1333 01/03/24 1404 01/04/24 0524  WBC 8.7  --  5.4  HGB 13.3 10.5* 12.1  HCT 38.4 31.0* 35.9*  MCV 87.9  --  90.0  PLT 203  --  151   Cardiac Enzymes: No results for input(s): "CKTOTAL", "CKMB", "CKMBINDEX", "TROPONINI" in the last 168 hours. BNP: Invalid input(s): "POCBNP" CBG: Recent Labs  Lab 01/03/24 1315 01/03/24 1333  GLUCAP 95 122*   D-Dimer No results for input(s): "DDIMER" in the last 72 hours. Hgb A1c No results for input(s): "HGBA1C" in the last 72 hours. Lipid Profile No results for input(s): "CHOL", "HDL", "LDLCALC", "TRIG", "CHOLHDL", "LDLDIRECT" in the last 72 hours. Thyroid function studies No results for input(s): "TSH", "T4TOTAL", "T3FREE", "THYROIDAB" in the last 72 hours.  Invalid input(s): "FREET3" Anemia work up No results for input(s): "VITAMINB12", "FOLATE", "FERRITIN", "TIBC", "IRON", "RETICCTPCT" in the last 72 hours. Urinalysis    Component Value Date/Time   COLORURINE STRAW (A) 07/15/2023 1956   APPEARANCEUR Clear 12/15/2023 1351   LABSPEC 1.004 (L) 07/15/2023 1956   PHURINE 5.0 07/15/2023 1956   GLUCOSEU Negative 12/15/2023  1351   HGBUR NEGATIVE 07/15/2023 1956   BILIRUBINUR Negative 12/15/2023 1351   KETONESUR NEGATIVE 07/15/2023 1956   PROTEINUR Negative 12/15/2023 1351   PROTEINUR NEGATIVE 07/15/2023 1956   NITRITE Negative 12/15/2023 1351   NITRITE NEGATIVE 07/15/2023 1956   LEUKOCYTESUR Negative 12/15/2023 1351   LEUKOCYTESUR NEGATIVE 07/15/2023 1956   Sepsis Labs Recent Labs  Lab 01/03/24 1333 01/04/24 0524  WBC 8.7 5.4   Microbiology No results found for this or any previous visit (from the past 240 hours).   Time coordinating discharge: 38 min  SIGNED:   Alwyn Ren, MD  [facility] 01/04/2024, 3:24 PM

## 2024-01-06 ENCOUNTER — Encounter: Payer: Self-pay | Admitting: Urology

## 2024-01-06 DIAGNOSIS — C755 Malignant neoplasm of aortic body and other paraganglia: Secondary | ICD-10-CM | POA: Diagnosis not present

## 2024-01-06 NOTE — Patient Instructions (Signed)
 A Growth in the Kidney (Renal Mass): What to Know  A renal mass is an abnormal growth in the kidney. It may be found during an MRI, CT scan, or ultrasound that's done to check for other problems in the belly. Some renal masses are cancerous, or malignant, and can grow or spread quickly. Others are benign, which means they're not cancer. Renal masses include: Tumors. These may be either cancerous or benign. The most common cancerous tumor in adults is called renal cell carcinoma. In children, the most common type is Wilms tumor. The most common kidney tumors that aren't cancer include renal adenomas, oncocytomas, and angiomyolipomas (AML). Cysts. These are pockets of fluid that form on or in the kidney. What are the causes? Certain types of cancers, infections, or injuries can cause a renal mass. It's not always known what causes a cyst to form in or on the kidney. What are the signs or symptoms? Often, a renal mass or kidney cyst doesn't cause any signs or symptoms. How is this diagnosed? Your health care provider may suggest tests to diagnose the cause of your renal mass. These tests may include: A physical exam. Blood tests. Pee (urine) tests. Imaging tests, such as: CT scan. MRI. Ultrasound. Chest X-ray or bone scan. These may be done if a tumor is cancerous to see if the cancer has spread outside the kidney. Biopsy. This is when a small piece of tissue is removed from the renal mass for testing. How is this treated? Treatment is not always needed for a renal mass. Treatment will depend on the cause of the mass and if it's causing any problems or symptoms. For a cancerous renal mass, treatment options may include: Surgery. This is done to remove the tumor and any affected tissue. Chemotherapy. This uses medicines to kill cancer cells. Radiation. High-energy X-rays or gamma rays are used to kill cancer cells. Ablation. This uses extreme hot or cold temperature to kill the cancer  cells. Immunotherapy. Medicines are used to help the body's defense system (immune system) fight the cancer cells. Taking part in clinical trials. This involves trying new or experimental treatments to see if they're effective. Most kidney cysts don't need to be treated. Follow these instructions at home: What you need to do at home will depend on the cause of the mass. Follow the instructions that your provider gives you. In general: Take medicines only as told. If you were given antibiotics, take them as told. Do not stop taking them even if you start to feel better. Follow any instructions from your provider about what you can and can't do. Do not smoke, vape, or use nicotine or tobacco. Keep all follow-up visits. Your provider will need to check if your renal mass has changed or grown. Contact a health care provider if: You have flank pain, which is pain in your side or back. You have a fever. You have a loss of appetite. You have pain or swelling in your belly. You lose weight. Get help right away if: Your pain gets worse. There's blood in your pee. You can't pee. This information is not intended to replace advice given to you by your health care provider. Make sure you discuss any questions you have with your health care provider. Document Revised: 03/26/2023 Document Reviewed: 03/26/2023 Elsevier Patient Education  2024 [employer].

## 2024-01-10 NOTE — Progress Notes (Unsigned)
 GI Office Note    Referring Provider: Benita Stabile, MD Primary Care Physician:  Benita Stabile, MD Primary Gastroenterologist: Gerrit Friends.Rourk, MD  Date:  01/11/2024  ID:  Ebony Stevens, DOB [birth year], MRN [id number]  Chief Complaint   Chief Complaint  Patient presents with   Follow-up    Follow up. No problems    History of Present Illness  Ebony Stevens is a 73 y.o. female with a history of liver lesion noted many years prior EDA, cholecystectomy, chronic diarrhea, diabetes, HLD, HTN, asthma, Schatzki's ring, glomangiosarcoma of the back s/p excision in 2023, and current renal angiomyolipoma with consideration for ablation presenting today for follow up.   CT in May 2019: -hepatic steatosis, hepatomegaly, and area of chronic volume loss in the left lobe of the liver with new calcification and pneumobilia likely due to evolution of remote insult related to cholecystectomy.  AFP normal in the past.  Chronic diarrhea previously managed with Questran and antidiarrheals.  Has had benign random colon biopsies and celiac screening.   Colonoscopy in 2020 with 1 hyperplastic polyp, benign random colon biopsies.  Due to repeat in 2025 due to family history of colon cancer.   Ultrasound September 2022 with fatty liver, superimposed inflammation or fibrosis not excluded, patent portal vein, evidence of cholecystectomy, 4 cm right renal angiomyolipoma.    EGD 08/21/2021: Not Schatzki's ring s/p dilation, small to hernia, no portal gastropathy, normal duodenum.  Recommend starting on Protonix 40 mg daily.   OV 02/04/23. Dysphagia slightly improved since stretching.  Continues to have intermittent lower abdominal pain and alternating constipation with diarrhea, leaning more toward diarrhea.  Using Imodium as needed.  Not able to do a lot of physical therapy.  Reported a lot of nasal congestion Ebony Stevens allergies but no fevers or chills.  No overt abdominal pain but having back pain.  Having increased  peripheral edema. Started on spironolactone to help with peripheral edema.  Continue Lasix.   RUQ [place] with elastography 02/23/23: -CBD measuring 4 mm -Increased hepatic echogenicity -kPa 4.1  Last office visit 07/13/23.  GERD controlled as long as she follows diet.  Taking Imodium 2-4 every other day.  Has abdominal pain if she takes it every day.  Trying to stay eating her fiber.,  Occasionally no bowel movement at all but this is normal rare occasion.  Having back pain.  Taking her diuretics and denied any significant ascites.  Labs ordered including FibroSure.  Advised ultrasound in November.  Reinforced 2 g sodium diet.  Continue diuretics and pantoprazole. MELD 7 in April 2024  Fibrosure Oct 2024 with F2 bridging fibrosis, moderate to severe steatosis, severe NASH.  Mildly elevated bedsides to 154.  RUQ [place] 08/31/23: -Increase hepatic echogenicity suggestive of steatosis -4.5 cm echogenic mass off superior pole of right kidney compatible with angiomyolipoma -Recommended urology referral, patient requested to wait until the beginning of the following year  MR abdomen with and without contrast 11/12/2023: Macroscopic fat-containing mass arising from lateral midportion right kidney consistent with benign renal angiomyolipoma, advised to consider surgical referral for further management and surveillance consideration given risk of spontaneous hemorrhage.  She has seen urology and opted for surveillance of mass.  She will have follow-up renal ultrasound in 6 months.  Shortly after office visit patient decided she wanted to proceed with surgery.  She was made another office visit to discuss renal ablation versus nephrectomy.  Her follow-up visit 3/21 she opted for embolization, referred to IR for this.  Today:  Cirrhosis history Hematemesis/coffee ground emesis: none History of variceal bleeding: none Abdominal pain: discomfort with constipation.  Abdominal distention/worsening ascites:  none Fever/chills: none Episodes of confusion/disorientation: none Number of daily bowel movements: sometimes not having BM. Has not taken it but a couple times. Since being on mounjaro. Sometimes 1-2 dayu without a BM. Lately the smooth move tea has not been helping her just having small BM and then have pain liek she needs to go.  Taking diuretics?: yes aldactone 50mg  and lasix 40 mg daily.  Date of last EGD: November 2022 - no varices Prior history of banding?: none Prior episodes of SBP: none Last time liver imaging was performed: [place] Nov 2024 - no hepatoma  Hepatitis A and B vaccination status: Not immune to Hepatitis A  MELD 3.0: 20 at 08/10/2023  3:24 PM MELD-Na: 10 at 08/10/2023  3:24 PM Calculated from: Serum Creatinine: 1.42 mg/dL at 60/45/4098  1:19 PM Serum Sodium: 126 mmol/L at 08/10/2023  3:24 PM Total Bilirubin: 1.1 mg/dL at 14/78/2956  2:13 PM Serum Albumin: 4.5 g/dL (Using max of 3.5 g/dL) at 08/65/7846  9:62 PM INR(ratio): 1 at 08/10/2023  3:24 PM Age at listing (hypothetical): 72 years Sex: Female at 08/10/2023  3:24 PM  Low diastolic this morning as well. Was low. Following low fat and low sugar. Following low sodium diet. Has been on the [facility] for about 8 months or so. She is still tacking the sodium tablet. Not having significant swelling in lower extremities.   Reflux controlled with diet - only takes pantoprazole as needed.   Husband got discharged to rehab yesterday.    Wt Readings from Last 3 Encounters:  01/11/24 204 lb (92.5 kg)  01/03/24 222 lb 10.6 oz (101 kg)  11/15/23 223 lb (101.2 kg)    Current Outpatient Medications  Medication Sig Dispense Refill   acetaminophen (TYLENOL) 500 MG tablet Take 500 mg by mouth every 6 (six) hours as needed for moderate pain.     albuterol (VENTOLIN HFA) 108 (90 Base) MCG/ACT inhaler Inhale 1-2 puffs into the lungs every 6 (six) hours as needed for wheezing or shortness of breath.     aspirin EC 325 MG tablet  Take 325 mg by mouth daily.     atorvastatin (LIPITOR) 40 MG tablet Take 40 mg by mouth daily.     Calcium Carbonate-Vitamin D (CALTRATE 600+D PO) Take 1 tablet by mouth daily.     cetirizine (ZYRTEC) 10 MG tablet Take 10 mg by mouth daily.     estradiol (ESTRACE) 1 MG tablet Take 1 tablet by mouth at bedtime.     furosemide (LASIX) 40 MG tablet TAKE ONE TABLET BY MOUTH EVERY OTHER DAY (Patient taking differently: Take 40 mg by mouth daily.) 15 tablet 2   loperamide (IMODIUM A-D) 2 MG tablet Take 2 mg by mouth as needed for diarrhea or loose stools.     metFORMIN (GLUCOPHAGE-XR) 500 MG 24 hr tablet Take 500 mg by mouth 2 (two) times daily.     MOUNJARO 5 MG/0.5ML Pen Inject 5 mg into the skin once a week.     olmesartan (BENICAR) 40 MG tablet Take 40 mg by mouth daily.     pantoprazole (PROTONIX) 40 MG tablet Take 1 tablet (40 mg total) by mouth daily. 90 tablet 3   spironolactone (ALDACTONE) 50 MG tablet Take 1 tablet (50 mg total) by mouth daily. 30 tablet 11   No current facility-administered medications for this visit.  Past Medical History:  Diagnosis Date   Asthma    Chest pain 07/11/2015   Diabetes mellitus without complication (HCC)    Dyspnea 07/11/2015   Early cirrhosis (HCC) 2020   Excessive bleeding in the premenopausal period    Fibromyalgia    GERD (gastroesophageal reflux disease)    HTN (hypertension)    Hypertension    Localized edema    Mixed hyperlipidemia    PONV (postoperative nausea and vomiting)    Sleep apnea    Type 2 diabetes mellitus (HCC)     Past Surgical History:  Procedure Laterality Date   ABDOMINAL HYSTERECTOMY     1975 partial hysterectomy,  1978 "complete hysterctomy and retack bladder". states it didn't take.    BIOPSY  11/10/2018   Procedure: BIOPSY;  Surgeon: Corbin Ade, MD;  [facility];  Service: Endoscopy;;  colon    CHOLECYSTECTOMY     COLONOSCOPY WITH PROPOFOL N/A 11/10/2018   Surgeon: Corbin Ade, MD;  1  hyperplastic polyp, random colon biopsies benign, due for repeat in 2025 due to family history of colon cancer.   DILATION AND CURETTAGE OF UTERUS     ESOPHAGOGASTRODUODENOSCOPY (EGD) WITH PROPOFOL N/A 08/21/2021   Surgeon: Corbin Ade, MD; Mild Schatzki's ring dilated, small hiatal hernia, no portal gastropathy, normal examined duodenum.   MALONEY DILATION  08/21/2021   Procedure: MALONEY DILATION;  Surgeon: Corbin Ade, MD;  [facility];  Service: Endoscopy;;   POLYPECTOMY  11/10/2018   Procedure: POLYPECTOMY;  Surgeon: Corbin Ade, MD;  [facility];  Service: Endoscopy;;  colon    TONSILLECTOMY      Family History  Problem Relation Age of Onset   Cancer Sister        ovarian   Diabetes Brother    Colon cancer Brother 26       Passed from [facility] after 1 year    Allergies as of 01/11/2024   (No Known Allergies)    Social History   Socioeconomic History   Marital status: Married    Spouse name: Not on file   Number of children: Not on file   Years of education: Not on file   Highest education level: Not on file  Occupational History   Not on file  Tobacco Use   Smoking status: Never   Smokeless tobacco: Never  Vaping Use   Vaping status: Never Used  Substance and Sexual Activity   Alcohol use: No   Drug use: No   Sexual activity: Not Currently  Other Topics Concern   Not on file  Social History Narrative   ** Merged History Encounter **       Social Drivers of Health   Financial Resource Strain: Low Risk  (09/01/2023)   Overall Financial Resource Strain (CARDIA)    Difficulty of Paying Living Expenses: Not very hard  Food Insecurity: No Food Insecurity (01/06/2024)   [facility]   Hunger Vital Sign    Worried About Running Out of Food in the Last Year: Never true    Ran Out of Food in the Last Year: Never true  Transportation Needs: No Transportation Needs (01/06/2024)   [facility]    PRAPARE - Transportation    Lack of Transportation (Medical): No    Lack of Transportation (Non-Medical): No  Physical Activity: Not on file  Stress: Not on file  Social Connections: Moderately Isolated (01/04/2024)   Social  Connection and Isolation Panel [NHANES]    Frequency of Communication with Friends and Family: Once a week    Frequency of Social Gatherings with Friends and Family: Once a week    Attends Religious Services: 1 to 4 times per year    Active Member of [employer] or Organizations: No    Attends Banker Meetings: Never    Marital Status: Married   Review of Systems   Gen: Denies fever, chills, anorexia. Denies fatigue, weakness, weight loss.  CV: Denies chest pain, palpitations, syncope, peripheral edema, and claudication. Resp: Denies dyspnea at rest, cough, wheezing, coughing up blood, and pleurisy. GI: See HPI Derm: Denies rash, itching, dry skin Psych: Denies depression, anxiety, memory loss, confusion. No homicidal or suicidal ideation.  Heme: Denies bruising, bleeding, and enlarged lymph nodes.  Physical Exam   BP 136/66 (BP Location: Right Arm, Patient Position: Sitting, Cuff Size: Large)   Pulse (!) 55   Temp 98 F (36.7 C) (Temporal)   Ht 5\' 2"  (1.575 m)   Wt 204 lb (92.5 kg)   BMI 37.31 kg/m   General:   Alert and oriented. No distress noted. Pleasant and cooperative.  Head:  Normocephalic and atraumatic. Eyes:  Conjuctiva clear without scleral icterus. Mouth:  Oral mucosa pink and moist. Good dentition. No lesions. Lungs:  Clear to auscultation bilaterally. No wheezes, rales, or rhonchi. No distress.  Heart:  S1, S2 present without murmurs appreciated.  Abdomen:  +BS, soft, non-tender and non-distended . No rebound or guarding. No HSM or masses noted. Rectal: deferred Msk:  Symmetrical without gross deformities. Normal posture. Extremities:  Without edema. Neurologic:  Alert and  oriented x4 Psych:  Alert and cooperative. Normal mood  and affect.  Assessment  Ebony Stevens is a 73 y.o. female with a history of liver lesion noted many years prior EDA, cholecystectomy, chronic diarrhea, diabetes, HLD, HTN, asthma, Schatzki's ring, glomangiosarcoma of the back s/p excision in 2023, and current renal angiomyolipoma with consideration for ablation in the near future presenting today for follow-up of chronic GI conditions.  MASH Cirrhosis: If 3+ F4 fibrosis on elastography in 2020.  MELD score in October was 20, likely driven by creatinine and hyponatremia.  Currently on salt tabs for chronic hyponatremia.  Last EGD in November 2022 was negative for varices or portal gastropathy.  Following a 2 g sodium diet and as well has cut back on fat and sugar intake.  Currently no peripheral swelling with Lasix 48 and Aldactone 50 daily however given her hyponatremia we will decrease her Lasix to every other day as instructed previously by other providers on her care team, we will continue to monitor sodium levels and increase Lasix again as needed/tolerated.  Ultrasound in November without evidence of hepatoma, due for screening in May.  GERD, dysphagia: No current dysphagia.  History of Schatzki's ring requiring dilation in November 2022.  Reflux previously well-controlled with pantoprazole, with dietary changes has only been using pantoprazole as needed.  Advised to continue this and if symptoms become more frequent to start daily.  Chronic diarrhea: Leaning more toward constipation recently.  Can go a day or 2 without a bowel movement recently.  Previously with chronic intermittent diarrhea.  Change in bowel habit likely secondary to [facility] as well as dietary changes and weight loss.  Advised to use MiraLAX as needed and start Metamucil to help with better bowel regularity.  Given no evidence of hepatic encephalopathy, will hold off on starting lactulose.  Given  reports of angina with activity and some associated shortness of breath.  In the  setting of her reported congestive heart failure, will refer back to cardiology given she has not seen them for a while.  I also would like for them to see her given her most recent episode of bradycardia with syncope although suspected to be secondary to vasovagal phenomenon.  PLAN   RUQ [place] May 2025 2g sodium diet HFP, INR today MELD labs in 6 months Lasix every other day. Continue aldactone daily Miralax 17g daily Metamucil  Continue pantoprazole 40 mg once daily. Cardiology referral Follow up in 6 months.     Brooke Bonito, MSN, FNP-BC, AGACNP-BC [facility]

## 2024-01-11 ENCOUNTER — Ambulatory Visit: Admitting: Gastroenterology

## 2024-01-11 ENCOUNTER — Encounter: Payer: Self-pay | Admitting: Gastroenterology

## 2024-01-11 ENCOUNTER — Other Ambulatory Visit: Payer: Self-pay | Admitting: *Deleted

## 2024-01-11 VITALS — BP 136/66 | HR 55 | Temp 98.0°F | Ht 62.0 in | Wt 204.0 lb

## 2024-01-11 DIAGNOSIS — E871 Hypo-osmolality and hyponatremia: Secondary | ICD-10-CM

## 2024-01-11 DIAGNOSIS — K7460 Unspecified cirrhosis of liver: Secondary | ICD-10-CM | POA: Diagnosis not present

## 2024-01-11 DIAGNOSIS — Z8719 Personal history of other diseases of the digestive system: Secondary | ICD-10-CM

## 2024-01-11 DIAGNOSIS — I2089 Other forms of angina pectoris: Secondary | ICD-10-CM

## 2024-01-11 DIAGNOSIS — R1310 Dysphagia, unspecified: Secondary | ICD-10-CM

## 2024-01-11 DIAGNOSIS — K5900 Constipation, unspecified: Secondary | ICD-10-CM

## 2024-01-11 DIAGNOSIS — K7581 Nonalcoholic steatohepatitis (NASH): Secondary | ICD-10-CM | POA: Diagnosis not present

## 2024-01-11 DIAGNOSIS — K529 Noninfective gastroenteritis and colitis, unspecified: Secondary | ICD-10-CM | POA: Diagnosis not present

## 2024-01-11 DIAGNOSIS — K219 Gastro-esophageal reflux disease without esophagitis: Secondary | ICD-10-CM | POA: Diagnosis not present

## 2024-01-11 DIAGNOSIS — R001 Bradycardia, unspecified: Secondary | ICD-10-CM

## 2024-01-11 NOTE — Patient Instructions (Addendum)
 Please have blood work completed at [facility].  We will call you with results once they have been received. Please allow 3-5 business days for review.  Please have this done within the next 2 weeks 2 locations for [facility] in [place]:              1. [address],               2. [address] [place]   We will need to do your ultrasound again in May.  Cirrhosis Lifestyle Recommendations:  High-protein diet from a primarily plant-based diet. Avoid red meat.  No raw or undercooked meat, seafood, or shellfish. Low-fat/cholesterol/carbohydrate diet. Limit sodium to no more than 2000 mg/day including everything that you eat and drink. Recommend at least 30 minutes of aerobic and resistance exercise 3 days/week. Limit Tylenol to 2000 mg daily.   Please start taking your Lasix 40 mg every other day.  Continue taking your Aldactone daily.  For now continue taking your salt tabs.  For constipation want you to start taking MiraLAX 1 capful every day.  If you have more than 3 stools a day I want you to reduce this to a half a capful every day.  Continue your pantoprazole 40 mg once daily.  Follow-up in 6 months  It was a pleasure to see you today. I want to create trusting relationships with patients. If you receive a survey regarding your visit,  I greatly appreciate you taking time to fill this out on paper or through your MyChart. I value your feedback.  Brooke Bonito, MSN, FNP-BC, AGACNP-BC [facility]

## 2024-01-14 NOTE — Patient Instructions (Signed)
 Visit Information  Thank you for taking time to visit with me today. Please don't hesitate to contact me if I can be of assistance to you.   Following are the goals we discussed today:   Goals Addressed             This Visit's Progress    Improve my health- Weight management, Diabetes, Heart failure- VBCI RN CM       Interventions Today    Flowsheet Row Most Recent Value  Chronic Disease   Chronic disease during today's visit Diabetes, Other  [follow up MRI, Mounjaro, sodium tabs]  General Interventions   General Interventions Discussed/Reviewed General Interventions Reviewed, [facility], Doctor Visits, Communication with  Doctor Visits Discussed/Reviewed Doctor Visits Reviewed, PCP, Specialist  PCP/Specialist Visits Compliance with follow-up visit  Communication with PCP/Specialists  Education Interventions   Education Provided Provided Education  Provided Verbal Education On Mental Health/Coping with Illness, Medication, Insurance Plans, Other  Mental Health Interventions   Mental Health Discussed/Reviewed Mental Health Reviewed, Coping Strategies  Pharmacy Interventions   Pharmacy Dicussed/Reviewed Pharmacy Topics Reviewed, Medications and their functions, Affording Medications              Our next appointment is by telephone on 11/15/23 at 1:45  Please call the care guide team at [phone] if you need to cancel or reschedule your appointment.   If you are experiencing a Mental Health or Behavioral Health Crisis or need someone to talk to, please call the Suicide and Crisis Lifeline: 988 call the [place] National Suicide Prevention Lifeline: 713-417-4347 or TTY: 636-408-0473 TTY 504-610-5726) to talk to a trained counselor call 1-800-273-TALK (toll free, 24 hour hotline) call the [facility]: 913-588-6288 call 911   Patient verbalizes understanding of instructions and care plan provided today and agrees to view in MyChart. Active  MyChart status and patient understanding of how to access instructions and care plan via MyChart confirmed with patient.     The patient has been provided with contact information for the care management team and has been advised to call with any health related questions or concerns.    Katai Marsico L. Noelle Penner, RN, BSN, CCM [place]  [facility], [facility] RN Care Manager Direct Dial: [phone]  Fax: [phone]

## 2024-01-14 NOTE — Patient Instructions (Signed)
 Visit Information  Thank you for taking time to visit with me today. Please don't hesitate to contact me if I can be of assistance to you.   Following are the goals we discussed today:   Goals Addressed             This Visit's Progress    Improve my health- Weight management, Diabetes, Heart failure- VBCI RN CM       Interventions Today    Flowsheet Row Most Recent Value  Chronic Disease   Chronic disease during today's visit Diabetes, Hypertension (HTN), Other  [MRI result]  General Interventions   General Interventions Discussed/Reviewed General Interventions Reviewed, Doctor Visits, Labs  Labs --  [MRI]  Doctor Visits Discussed/Reviewed Doctor Visits Reviewed, PCP, Specialist  PCP/Specialist Visits Compliance with follow-up visit  Education Interventions   Education Provided Provided Education  [pain management]  Provided Verbal Education On Other, Labs  Labs Reviewed --  [MRI]  Mental Health Interventions   Mental Health Discussed/Reviewed Mental Health Reviewed, Coping Strategies  Pharmacy Interventions   Pharmacy Dicussed/Reviewed Pharmacy Topics Reviewed, Affording Medications, Medications and their functions  Safety Interventions   Safety Discussed/Reviewed Safety Reviewed, Fall Risk, Home Safety  Home Safety Assistive Devices              Our next appointment is by telephone on 01/17/24 at 3:15 pm  Please call the care guide team at [phone] if you need to cancel or reschedule your appointment.   If you are experiencing a Mental Health or Behavioral Health Crisis or need someone to talk to, please call the Suicide and Crisis Lifeline: 988 call the [place] National Suicide Prevention Lifeline: 802-652-8227 or TTY: (445)861-3323 TTY (506) 661-4846) to talk to a trained counselor call 1-800-273-TALK (toll free, 24 hour hotline) call the [facility]: (312)785-4119 call 911   Patient verbalizes understanding of instructions and care plan  provided today and agrees to view in MyChart. Active MyChart status and patient understanding of how to access instructions and care plan via MyChart confirmed with patient.     The patient has been provided with contact information for the care management team and has been advised to call with any health related questions or concerns.    Arieh Bogue L. Noelle Penner, RN, BSN, CCM [place]  [facility], [facility] RN Care Manager Direct Dial: [phone]  Fax: [phone]

## 2024-01-17 ENCOUNTER — Encounter: Payer: Self-pay | Admitting: *Deleted

## 2024-01-17 ENCOUNTER — Other Ambulatory Visit: Payer: Self-pay

## 2024-01-17 ENCOUNTER — Ambulatory Visit: Payer: Self-pay | Admitting: *Deleted

## 2024-01-17 NOTE — Patient Instructions (Addendum)
 Visit Information  Thank you for taking time to visit with me today. Please don't hesitate to contact me if I can be of assistance to you before our next scheduled appointment.   Goals Addressed             This Visit's Progress    COMPLETED: Improve my health by evidence of weight management, control diabetes & heart Failure - RN CM   On track    01/17/24 Patient continues on [facility] & losing weight. Reported weight of 200 lbs Denies worsening symptoms related to diabetes & CHF  This Duplicate goal closed Interventions Today    Flowsheet Row Most Recent Value  Chronic Disease   Chronic disease during today's visit Diabetes, Hypertension (HTN), Other  [MRI result]  General Interventions   General Interventions Discussed/Reviewed General Interventions Reviewed, Doctor Visits, Labs  Labs --  [MRI]  Doctor Visits Discussed/Reviewed Doctor Visits Reviewed, PCP, Specialist  PCP/Specialist Visits Compliance with follow-up visit  Education Interventions   Education Provided Provided Education  [pain management]  Provided Verbal Education On Other, Labs  Labs Reviewed --  [MRI]  Mental Health Interventions   Mental Health Discussed/Reviewed Mental Health Reviewed, Coping Strategies  Pharmacy Interventions   Pharmacy Dicussed/Reviewed Pharmacy Topics Reviewed, Affording Medications, Medications and their functions  Safety Interventions   Safety Discussed/Reviewed Safety Reviewed, Fall Risk, Home Safety  Home Safety Assistive Devices           VBC[facility] RN Care Plan   On track    Problems:  Chronic Disease Management support and education needs related to CHF and DMII Knowledge Deficits related to CHF and DMII Weight management, better dietary choices  Goal: Over the next 30 days the Patient will demonstrate Improved adherence to prescribed treatment plan for CHF, DMII, and weight management as evidenced by no worsening symptoms of CHF, elevations in CBG's & decrease in weight  value not experience hospital admission as evidenced by review of EMR. Hospital Admissions in last 6 months = 1 take all medications exactly as prescribed and will call provider for medication related questions as evidenced by patient verbal confirmation, EMR    verbalize basic understanding of CHF, DMII, and weight management/better food choices disease process and self health management plan as evidenced by decrease in weight and not worsening voiced symptoms or values for CHF & DM   Interventions:   Heart Failure Interventions:  (Status:  Goal on track:  Yes.) Short Term Goal Basic overview and discussion of pathophysiology of Heart Failure reviewed Provided education on low sodium diet Reviewed Heart Failure Action Plan in depth and provided written copy Discussed importance of daily weight and advised patient to weigh and record daily Discussed the importance of keeping all appointments with provider  Diabetes Interventions:  (Status:  Goal on track:  Yes.) Long Term Goal Assessed patient's understanding of A1c goal: <7% Provided education to patient about basic DM disease process Lab Results  Component Value Date   HGBA1C 5.9 (H) 07/15/2023    Weight Loss Interventions:  (Status:  Goal on track:  Yes.) Short Term Goal Advised patient to discuss with primary care provider options regarding weight management Provided verbal and/or written education to patient re: provider recommended life style modifications  Reviewed recommended dietary changes: avoid fad diets, make small/incremental dietary and exercise changes, eat at the table and avoid eating in front of the TV, plan management of cravings, monitor snacking and cravings in food diary Assessed social determinant of health barriers  Patient Self-Care Activities:  Attend all scheduled provider appointments Call pharmacy for medication refills 3-7 days in advance of running out of medications Call provider office for new  concerns or questions  Participate in Transition of Care Program/Attend TOC scheduled calls Perform all self care activities independently  Perform IADL's (shopping, preparing meals, housekeeping, managing finances) independently Take medications as prescribed    Plan:  Next PCP appointment scheduled for:  Telephone follow up appointment with care management team member scheduled for:  01/19/24 at 1030   The patient has been provided with contact information for the care management team and has been advised to call with any health related questions or concerns.               Your next care management appointment is by telephone on 02/14/24 at 3 pm  Telephone follow-up in 1 month  Please call the care guide team at [phone] if you need to cancel, schedule, or reschedule an appointment.   Please call the Suicide and Crisis Lifeline: 988 call the [place] National Suicide Prevention Lifeline: 2895450169 or TTY: 431-400-5702 TTY 769-366-1773) to talk to a trained counselor call 1-800-273-TALK (toll free, 24 hour hotline) call the [facility]: [phone] call 911 if you are experiencing a Mental Health or Behavioral Health Crisis or need someone to talk to.  Linetta Regner L. Noelle Penner, RN, BSN, CCM [place]  [facility], [facility] RN Care Manager Direct Dial: [phone]  Fax: [phone]

## 2024-01-17 NOTE — Patient Outreach (Signed)
 Complex Care Management   Visit Note  01/17/2024  Name:  Ebony Stevens MRN: [id number] DOB: 1951/09/25  Situation: Referral received for Complex Care Management related to  cost of Mounjaro, diabetes control, weight management  I obtained verbal consent from Patient, Ebony Stevens.  Visit completed with Rosette Reveal  on the phone Walking at home Discusssed chest discomfort in middle " between my breasts" PMH GERD   Background:   Past Medical History:  Diagnosis Date   Asthma    Chest pain 07/11/2015   Diabetes mellitus without complication (HCC)    Dyspnea 07/11/2015   Early cirrhosis (HCC) 2020   Excessive bleeding in the premenopausal period    Fibromyalgia    GERD (gastroesophageal reflux disease)    HTN (hypertension)    Hypertension    Localized edema    Mixed hyperlipidemia    PONV (postoperative nausea and vomiting)    Sleep apnea    Type 2 diabetes mellitus (HCC)     Assessment: Patient Reported Symptoms:  Cognitive Alert and oriented to person, place, and time  Neurological No symptoms reported    HEENT No symptoms reported    Cardiovascular Chest pain or discomfort (correction reports discomfort at intervals in the middle of her chest "between my brests") syncope in elevator while visiting spouse at [place] Long fall with minimal scrapes & bruises  Respiratory No symptoms reported    Endocrine No symptoms reported    Gastrointestinal Abdominal pain or discomfort    Genitourinary No symptoms reported    Integumentary Bruising    Musculoskeletal No symptoms reported    Psychosocial No symptoms reported     There were no vitals filed for this visit.  Medications Reviewed Today     Reviewed by Clinton Gallant, RN (Registered Nurse) on 01/17/24 at 1818  Med List Status: <None>   Medication Order Taking? Sig Documenting Provider Last Dose Status Informant  acetaminophen (TYLENOL) 500 MG tablet [id number]  Take 500 mg by mouth every 6 (six) hours  as needed for moderate pain. [provider]  Active Self, Pharmacy Records  albuterol (VENTOLIN HFA) 108 (90 Base) MCG/ACT inhaler [id number]  Inhale 1-2 puffs into the lungs every 6 (six) hours as needed for wheezing or shortness of breath. [provider]  Active Self, Pharmacy Records  aspirin EC 325 MG tablet [id number]  Take 325 mg by mouth daily. [provider]  Active Self, Pharmacy Records  atorvastatin (LIPITOR) 40 MG tablet [id number]  Take 40 mg by mouth daily. [provider]  Active Self, Pharmacy Records  Calcium Carbonate-Vitamin D (CALTRATE 600+D PO) [id number]  Take 1 tablet by mouth daily. [provider]  Active Self, Pharmacy Records  cetirizine (ZYRTEC) 10 MG tablet [id number]  Take 10 mg by mouth daily. [provider]  Active Self, Pharmacy Records  estradiol (ESTRACE) 1 MG tablet [id number]  Take 1 tablet by mouth at bedtime. [provider]  Active Self, Pharmacy Records  furosemide (LASIX) 40 MG tablet [id number]  TAKE ONE TABLET BY MOUTH EVERY OTHER DAY  Patient taking differently: Take 40 mg by mouth daily.   Pricilla Riffle, MD  Active Self, Pharmacy Records  loperamide (IMODIUM A-D) 2 MG tablet [id number]  Take 2 mg by mouth as needed for diarrhea or loose stools. [provider]  Active Self, Pharmacy Records  metFORMIN (GLUCOPHAGE-XR) 500 MG 24 hr tablet [id number]  Take 500 mg by mouth 2 (two) times daily. [provider]  Active Self,  Pharmacy Records  [facility] 5 MG/0.5ML Pen [id number] Yes Inject 5 mg into the skin once a week. [provider] Taking Active Self, Pharmacy Records  olmesartan (BENICAR) 40 MG tablet [id number]  Take 40 mg by mouth daily. [provider]  Active Self, Pharmacy Records  pantoprazole (PROTONIX) 40 MG tablet [id number]  Take 1 tablet (40 mg total) by mouth daily. Aida Raider, NP  Active Self, Pharmacy Records  sodium chloride 1 g tablet [id number]   Take 1 g by mouth 3 (three) times daily with meals. [provider]  Active   spironolactone (ALDACTONE) 50 MG tablet [id number]  Take 1 tablet (50 mg total) by mouth daily. Aida Raider, NP  Active Self, Pharmacy Records            Recommendation:   PCP Follow-up Specialty provider follow-up cardiology for bradycardia Diagnostic requests: urology imaging visit On 01/19/24 will be seen by Micael Hampshire at [facility] office as confirmed with Alan Ripper for post observation hospital stay on 01/03/24 to 01/04/24 for syncope & bradycardia, get urology imaging visit schedule   Follow Up Plan:   Telephone follow up appointment date/time:  02/14/24 at 3 pm  Aryella Besecker L. Noelle Penner, RN, BSN, CCM [place]  [facility], [facility] RN Care Manager Direct Dial: [phone]  Fax: [phone]

## 2024-01-18 ENCOUNTER — Encounter: Payer: Self-pay | Admitting: *Deleted

## 2024-01-18 NOTE — Patient Outreach (Signed)
 Complex Care Management   Visit Note  01/18/2024  Name:  Ebony Stevens MRN: [id number] DOB: 08/07/1951  Situation: Referral received for Complex Care Management related to Heart Failure, Diabetes with Complications, and right renal mass  I obtained verbal consent from Ebony Stevens on 01/17/24.  Visit completed with a message left for the urology referral staff  on the phone  Background:   Past Medical History:  Diagnosis Date   Asthma    Chest pain 07/11/2015   Diabetes mellitus without complication (HCC)    Dyspnea 07/11/2015   Early cirrhosis (HCC) 2020   Excessive bleeding in the premenopausal period    Fibromyalgia    GERD (gastroesophageal reflux disease)    HTN (hypertension)    Hypertension    Localized edema    Mixed hyperlipidemia    PONV (postoperative nausea and vomiting)    Sleep apnea    Type 2 diabetes mellitus (HCC)     Assessment: Patient Reported Symptoms:  Cognitive    Neurological      HEENT      Cardiovascular      Respiratory      Endocrine      Gastrointestinal      Genitourinary      Integumentary      Musculoskeletal      Psychosocial       There were no vitals filed for this visit.  Medications Reviewed Today   Medications were not reviewed in this encounter     Recommendation:   Specialty provider follow-up Dr Wilkie Aye office/referral staff for the status of referral Referral to: Interventional radiology, enter in on March 11 & December 31, 2023 for right renal mass  Follow Up Plan:   Telephone follow up appointment date/time:  02/14/24  Cala Bradford L. Noelle Penner, RN, BSN, CCM [place]  [facility], [facility] RN Care Manager Direct Dial: [phone]  Fax: [phone]

## 2024-01-18 NOTE — Patient Instructions (Signed)
 Visit Information  Thank you for taking time to visit with me today. Please don't hesitate to contact me if I can be of assistance to you before our next scheduled appointment.  Your next care management appointment is by telephone on 02/14/24 at 3 pm  Telephone follow up appointment date/time:  02/14/24 3 pm  Please call the care guide team at [phone] if you need to cancel, schedule, or reschedule an appointment.   Please call the Suicide and Crisis Lifeline: 988 call the [place] National Suicide Prevention Lifeline: 727-311-9929 or TTY: (806)372-8111 TTY (585)684-5073) to talk to a trained counselor call 1-800-273-TALK (toll free, 24 hour hotline) call the [facility]: (830) [phone] call 911 if you are experiencing a Mental Health or Behavioral Health Crisis or need someone to talk to.  Sophi Calligan L. Noelle Penner, RN, BSN, CCM [place]  [facility], [facility] RN Care Manager Direct Dial: [phone]  Fax: [phone]

## 2024-01-19 ENCOUNTER — Encounter: Payer: Self-pay | Admitting: Gastroenterology

## 2024-01-19 DIAGNOSIS — Z7689 Persons encountering health services in other specified circumstances: Secondary | ICD-10-CM | POA: Diagnosis not present

## 2024-01-19 DIAGNOSIS — Z8679 Personal history of other diseases of the circulatory system: Secondary | ICD-10-CM | POA: Diagnosis not present

## 2024-01-19 DIAGNOSIS — E871 Hypo-osmolality and hyponatremia: Secondary | ICD-10-CM | POA: Diagnosis not present

## 2024-01-19 DIAGNOSIS — E1165 Type 2 diabetes mellitus with hyperglycemia: Secondary | ICD-10-CM | POA: Diagnosis not present

## 2024-01-21 ENCOUNTER — Other Ambulatory Visit: Payer: Self-pay | Admitting: Urology

## 2024-02-01 DIAGNOSIS — E1165 Type 2 diabetes mellitus with hyperglycemia: Secondary | ICD-10-CM | POA: Diagnosis not present

## 2024-02-01 DIAGNOSIS — I129 Hypertensive chronic kidney disease with stage 1 through stage 4 chronic kidney disease, or unspecified chronic kidney disease: Secondary | ICD-10-CM | POA: Diagnosis not present

## 2024-02-02 DIAGNOSIS — M67442 Ganglion, left hand: Secondary | ICD-10-CM | POA: Insufficient documentation

## 2024-02-07 ENCOUNTER — Other Ambulatory Visit (HOSPITAL_COMMUNITY): Payer: Self-pay | Admitting: Nurse Practitioner

## 2024-02-07 DIAGNOSIS — E782 Mixed hyperlipidemia: Secondary | ICD-10-CM | POA: Diagnosis not present

## 2024-02-07 DIAGNOSIS — E871 Hypo-osmolality and hyponatremia: Secondary | ICD-10-CM | POA: Diagnosis not present

## 2024-02-07 DIAGNOSIS — I130 Hypertensive heart and chronic kidney disease with heart failure and stage 1 through stage 4 chronic kidney disease, or unspecified chronic kidney disease: Secondary | ICD-10-CM | POA: Diagnosis not present

## 2024-02-07 DIAGNOSIS — I5032 Chronic diastolic (congestive) heart failure: Secondary | ICD-10-CM | POA: Diagnosis not present

## 2024-02-07 DIAGNOSIS — Z13820 Encounter for screening for osteoporosis: Secondary | ICD-10-CM

## 2024-02-07 DIAGNOSIS — E1165 Type 2 diabetes mellitus with hyperglycemia: Secondary | ICD-10-CM | POA: Diagnosis not present

## 2024-02-07 DIAGNOSIS — R600 Localized edema: Secondary | ICD-10-CM | POA: Diagnosis not present

## 2024-02-07 DIAGNOSIS — K760 Fatty (change of) liver, not elsewhere classified: Secondary | ICD-10-CM | POA: Diagnosis not present

## 2024-02-07 DIAGNOSIS — E1121 Type 2 diabetes mellitus with diabetic nephropathy: Secondary | ICD-10-CM | POA: Diagnosis not present

## 2024-02-07 DIAGNOSIS — Z0001 Encounter for general adult medical examination with abnormal findings: Secondary | ICD-10-CM | POA: Diagnosis not present

## 2024-02-07 DIAGNOSIS — Z1231 Encounter for screening mammogram for malignant neoplasm of breast: Secondary | ICD-10-CM

## 2024-02-07 DIAGNOSIS — R800 Isolated proteinuria: Secondary | ICD-10-CM | POA: Diagnosis not present

## 2024-02-07 DIAGNOSIS — I129 Hypertensive chronic kidney disease with stage 1 through stage 4 chronic kidney disease, or unspecified chronic kidney disease: Secondary | ICD-10-CM | POA: Diagnosis not present

## 2024-02-07 DIAGNOSIS — M545 Low back pain, unspecified: Secondary | ICD-10-CM | POA: Diagnosis not present

## 2024-02-14 ENCOUNTER — Other Ambulatory Visit: Payer: Self-pay | Admitting: *Deleted

## 2024-02-15 NOTE — Patient Instructions (Signed)
 Visit Information  Thank you for taking time to visit with me today. Please don't hesitate to contact me if I can be of assistance to you before our next scheduled appointment.  Your next care management appointment is by telephone on 03/14/24 at 3 pm  Please outreach to RN case manager as needed  Please call the care guide team at [phone] if you need to cancel, schedule, or reschedule an appointment.   Please call the Suicide and Crisis Lifeline: 988 call the [place]  National Suicide Prevention Lifeline: (212)203-5518 or TTY: 228-402-6167 TTY 9185798911) to talk to a trained counselor call 1-800-273-TALK (toll free, 24 hour hotline) call the [facility]: (561)403-6471 call 911 if you are experiencing a Mental Health or Behavioral Health Crisis or need someone to talk to.  Chidera Dearcos L. Mcarthur Speedy, RN, BSN, CCM [place]  [facility], [facility] RN Care Manager Direct Dial: [phone]  Fax: [phone]

## 2024-02-21 ENCOUNTER — Ambulatory Visit
Admission: RE | Admit: 2024-02-21 | Discharge: 2024-02-21 | Disposition: A | Discharge status: Home / Self Care | Source: Ambulatory Visit | Attending: Urology | Admitting: Urology

## 2024-02-21 DIAGNOSIS — D1771 Benign lipomatous neoplasm of kidney: Secondary | ICD-10-CM | POA: Diagnosis not present

## 2024-02-21 DIAGNOSIS — N2889 Other specified disorders of kidney and ureter: Secondary | ICD-10-CM

## 2024-02-21 NOTE — Consult Note (Signed)
 Chief Complaint: Patient was seen in consultation today for right renal angiomyolipma  at the request of McKenzie,Patrick L  Referring Physician(s): McKenzie,Patrick L  History of Present Illness: Ebony Stevens is a 73 y.o. female with PMH significant for DM, HTN, GERD, hyperlipidemia, renal insufficiency, with enlarging right renal angiomyolipoma (AML). 4.1cm on recent MRI. Previous CT 2019 3.3cm. No hemorrhage. No hematuria. Some R flank pain. No contrast allergy. No bleeding diathesis.  Past Medical History:  Diagnosis Date   Asthma    Chest pain 07/11/2015   Diabetes mellitus without complication (HCC)    Dyspnea 07/11/2015   Early cirrhosis (HCC) 2020   Excessive bleeding in the premenopausal period    Fibromyalgia    GERD (gastroesophageal reflux disease)    HTN (hypertension)    Hypertension    Localized edema    Mixed hyperlipidemia    PONV (postoperative nausea and vomiting)    Sleep apnea    Type 2 diabetes mellitus (HCC)     Past Surgical History:  Procedure Laterality Date   ABDOMINAL HYSTERECTOMY     1975 partial hysterectomy,  1978 "complete hysterctomy and retack bladder". states it didn't take.    BIOPSY  11/10/2018   Procedure: BIOPSY;  Surgeon: Suzette Espy, MD;  Location: [facility];  Service: Endoscopy;;  colon    CHOLECYSTECTOMY     COLONOSCOPY WITH PROPOFOL  N/A 11/10/2018   Surgeon: Suzette Espy, MD;  1 hyperplastic polyp, random colon biopsies benign, due for repeat in 2025 due to family history of colon cancer.   DILATION AND CURETTAGE OF UTERUS     ESOPHAGOGASTRODUODENOSCOPY (EGD) WITH PROPOFOL  N/A 08/21/2021   Surgeon: Suzette Espy, MD; Mild Schatzki's ring dilated, small hiatal hernia, no portal gastropathy, normal examined duodenum.   MALONEY DILATION  08/21/2021   Procedure: MALONEY DILATION;  Surgeon: Suzette Espy, MD;  Location: [facility];  Service: Endoscopy;;   POLYPECTOMY  11/10/2018   Procedure: POLYPECTOMY;   Surgeon: Suzette Espy, MD;  Location: [facility];  Service: Endoscopy;;  colon    TONSILLECTOMY      Allergies: Patient has no known allergies.  Medications: Prior to Admission medications   Medication Sig Start Date End Date Taking? Authorizing Provider  aspirin  EC 325 MG tablet Take 325 mg by mouth daily.   Yes [provider]  acetaminophen  (TYLENOL ) 500 MG tablet Take 500 mg by mouth every 6 (six) hours as needed for moderate pain.    [provider]  albuterol  (VENTOLIN  HFA) 108 (90 Base) MCG/ACT inhaler Inhale 1-2 puffs into the lungs every 6 (six) hours as needed for wheezing or shortness of breath.    [provider]  atorvastatin  (LIPITOR) 40 MG tablet Take 40 mg by mouth daily. 09/20/20   [provider]  Calcium  Carbonate-Vitamin D (CALTRATE 600+D PO) Take 1 tablet by mouth daily.    [provider]  cetirizine (ZYRTEC) 10 MG tablet Take 10 mg by mouth daily.    [provider]  estradiol (ESTRACE) 1 MG tablet Take 1 tablet by mouth at bedtime. 03/27/19   [provider]  furosemide  (LASIX ) 40 MG tablet TAKE ONE TABLET BY MOUTH EVERY OTHER DAY Patient taking differently: Take 40 mg by mouth daily. 05/28/17   Elmyra Haggard, MD  loperamide (IMODIUM A-D) 2 MG tablet Take 2 mg by mouth as needed for diarrhea or loose stools.    [provider]  metFORMIN (GLUCOPHAGE-XR) 500 MG  24 hr tablet Take 500 mg by mouth 2 (two) times daily.    [provider]  MOUNJARO 5 MG/0.5ML Pen Inject 5 mg into the skin once a week. 05/26/23   [provider]  olmesartan (BENICAR) 40 MG tablet Take 40 mg by mouth daily. 11/28/23   [provider]  pantoprazole  (PROTONIX ) 40 MG tablet Take 1 tablet (40 mg total) by mouth daily. 12/29/23   Eustacio Highman, NP  sodium chloride  1 g tablet Take 1 g by mouth 3 (three) times daily with meals.    [provider]  spironolactone  (ALDACTONE ) 50 MG tablet  Take 1 tablet (50 mg total) by mouth daily. 12/29/23 12/28/24  Eustacio Highman, NP     Family History  Problem Relation Age of Onset   Cancer Sister        ovarian   Diabetes Brother    Colon cancer Brother 24       Passed from [facility] after 1 year    Social History   Socioeconomic History   Marital status: Married    Spouse name: Not on file   Number of children: Not on file   Years of education: Not on file   Highest education level: Not on file  Occupational History   Not on file  Tobacco Use   Smoking status: Never   Smokeless tobacco: Never  Vaping Use   Vaping status: Never Used  Substance and Sexual Activity   Alcohol use: No   Drug use: No   Sexual activity: Not Currently  Other Topics Concern   Not on file  Social History Narrative   ** Merged History Encounter **       Social Drivers of Health   Financial Resource Strain: Low Risk  (09/01/2023)   Overall Financial Resource Strain (CARDIA)    Difficulty of Paying Living Expenses: Not very hard  Food Insecurity: No Food Insecurity (01/17/2024)   Hunger Vital Sign    Worried About Running Out of Food in the Last Year: Never true    Ran Out of Food in the Last Year: Never true  Transportation Needs: No Transportation Needs (01/17/2024)   PRAPARE - Administrator, Civil Service (Medical): No    Lack of Transportation (Non-Medical): No  Physical Activity: Insufficiently Active (01/17/2024)   Exercise Vital Sign    Days of Exercise per Week: 3 days    Minutes of Exercise per Session: 30 min  Stress: No Stress Concern Present (01/17/2024)   [employer] of Occupational Health - Occupational Stress Questionnaire    Feeling of Stress : Only a little  Social Connections: Moderately Isolated (01/04/2024)   Social Connection and Isolation Panel [NHANES]    Frequency of Communication with Friends and Family: Once a week    Frequency of Social Gatherings with Friends and Family: Once a week    Attends  Religious Services: 1 to 4 times per year    Active Member of [employer] or Organizations: No    Attends Banker Meetings: Never    Marital Status: Married    ECOG Status: 0 - Asymptomatic  Review of Systems: A 12 point ROS discussed and pertinent positives are indicated in the HPI above.  All other systems are negative.  Review of Systems  Vital Signs: BP (!) 143/55 (BP Location: Left Arm, Patient Position: Sitting, Cuff Size: Normal)   Pulse (!) 46   Temp 97.7 F (36.5 C)   Resp 16  SpO2 97%    Physical Exam  Constitutional: Oriented to person, place, and time. Well-developed and well-nourished. No distress.   HENT:  Head: Normocephalic and atraumatic.  Eyes: Conjunctivae and EOM are normal. Right eye exhibits no discharge. Left eye exhibits no discharge. No scleral icterus.  Neck: No JVD present.  Pulmonary/Chest: Effort normal. No stridor. No respiratory distress.  Abdomen: soft, non distended, obese Neurological:  alert and oriented to person, place, and time.  Skin: Skin is warm and dry.  not diaphoretic.  Psychiatric:   normal mood and affect.   behavior is normal. Judgment and thought content normal.       Imaging: MRI ABDOMEN WITHOUT AND WITH CONTRAST  TECHNIQUE: Multiplanar multisequence MR imaging of the abdomen was performed both before and after the administration of intravenous contrast.  CONTRAST: 10mL GADAVIST  GADOBUTROL  1 MMOL/ML IV SOLN  COMPARISON: Abdominal ultrasound, 08/31/2023  FINDINGS: Lower chest: No acute abnormality.  Hepatobiliary: No focal liver abnormality is seen. Status post cholecystectomy. No biliary dilatation.  Pancreas: Unremarkable. No pancreatic ductal dilatation or surrounding inflammatory changes.  Spleen: Normal in size without significant abnormality.  Adrenals/Urinary Tract: Adrenal glands are unremarkable. Macroscopic fat containing mass arising from the lateral midportion of the right kidney  measuring 3.4 x 3.2 cm (series 5, image 25). The left kidney is normal, without renal calculi, solid lesion, or hydronephrosis.  Stomach/Bowel: Stomach is within normal limits. No evidence of bowel wall thickening, distention, or inflammatory changes.  Vascular/Lymphatic: No significant vascular findings are present. No enlarged abdominal lymph nodes.  Other: No abdominal wall hernia or abnormality. No ascites.  Musculoskeletal: No acute or significant osseous findings.  IMPRESSION: 1. Macroscopic fat containing mass arising from the lateral midportion of the right kidney measuring 3.4 x 3.2 cm, consistent with a benign renal angiomyolipoma. Given size and general risk of spontaneous hemorrhage, consider urologic surgical referral for further management and surveillance consideration. 2. Status post cholecystectomy.   Electronically Signed By: Fredricka Jenny M.D. On: 11/21/2023 17:57   Labs:  CBC: Recent Labs    07/17/23 0328 08/10/23 1524 01/03/24 1333 01/03/24 1404 01/04/24 0524  WBC 4.6 6.1 8.7  --  5.4  HGB 11.1* 12.3 13.3 10.5* 12.1  HCT 30.9* 36.5 38.4 31.0* 35.9*  PLT 166 201 203  --  151    COAGS: Recent Labs    08/10/23 1524  INR 1.0    BMP: Recent Labs    07/18/23 0351 07/19/23 0350 08/10/23 1524 01/03/24 1333 01/03/24 1404 01/04/24 0524  NA 124* 126* 126* 129* 130* 129*  K 4.8 4.4 5.1 4.4 4.8 3.9  CL 93* 95* 87* 97* 102 102  CO2 23 23 19* 18*  --  18*  GLUCOSE 148* 116* 108* 131* 118* 84  BUN 16 15 14  32* 38* 24*  CALCIUM  9.1 8.7* 9.4 9.0  --  8.3*  CREATININE 1.25* 1.22* 1.42* 1.58* 1.50* 0.99  GFRNONAA 46* 48*  --  35*  --  >60    LIVER FUNCTION TESTS: Recent Labs    07/16/23 0514 07/17/23 0328 08/10/23 1524  BILITOT 0.9 1.0 1.1  AST 34 28 29  ALT 27 24 22   ALKPHOS 66 63 102  PROT 6.2* 5.8* 6.9  ALBUMIN 4.0 3.6 4.5    TUMOR MARKERS: No results for input(s): "AFPTM", "CEA", "CA199", "CHROMGRNA" in the last 8760  hours.  Assessment and Plan:  My impression is that this patient has a slowly enlarging right renal AML, without e/o hemorrhage or other  complications features. Large fatty component, and a significant angiomatous component. Given its size >4cm, prophylactic embolization recommended to minimize risk of spontaneous hemorrhage.  I discussed with pt and her sister the pathophysiology of AML, benign non-cancerous nature of the tumor, increaseing bleeding risk with size >4cm. We discussed options of watchful waiting and subselective embolization. We discussed the embolization procedure from radial or femoral approach, anticipated benefits, possible risks and complications, and alternatives. THey seemed to understand and had all questions answered. She is eager to proceed. Therefore, we will go ahead and set her up at [facility] or [facility] for subselective emboliuzation of the R renal AML, under conscious sedation, as outpatient.  Thank you for this interesting consult.  I greatly enjoyed meeting KYNDAL CORRA and look forward to participating in their care.  A copy of this report was sent to the requesting provider on this date.  Electronically Signed: Dayne Halea Lieb 02/21/2024, 8:13 AM   I spent a total of  30 Minutes   in face to face in clinical consultation, greater than 50% of which was counseling/coordinating care for enlarging R renal AML.

## 2024-02-25 ENCOUNTER — Other Ambulatory Visit (HOSPITAL_COMMUNITY): Payer: Self-pay | Admitting: Interventional Radiology

## 2024-02-25 ENCOUNTER — Telehealth (HOSPITAL_COMMUNITY): Payer: Self-pay | Admitting: Interventional Radiology

## 2024-02-25 DIAGNOSIS — N2889 Other specified disorders of kidney and ureter: Secondary | ICD-10-CM

## 2024-02-25 NOTE — Patient Outreach (Signed)
 Complex Care Management   Visit Note  02/25/2024  Name:  Ebony Stevens MRN: [id number] DOB: March 29, 1951  Situation: Referral received for Complex Care Management related to Heart Failure I obtained verbal consent from Patient.  Visit completed with [name]  on the phone  Background:   Past Medical History:  Diagnosis Date   Asthma    Chest pain 07/11/2015   Diabetes mellitus without complication (HCC)    Dyspnea 07/11/2015   Early cirrhosis (HCC) 2020   Excessive bleeding in the premenopausal period    Fibromyalgia    GERD (gastroesophageal reflux disease)    HTN (hypertension)    Hypertension    Localized edema    Mixed hyperlipidemia    PONV (postoperative nausea and vomiting)    Sleep apnea    Type 2 diabetes mellitus (HCC)     Assessment: Patient Reported Symptoms:  Cognitive Cognitive Status: Alert and oriented to person, place, and time, Insightful and able to interpret abstract concepts, Normal speech and language skills Cognitive/Intellectual Conditions Management [RPT]: None reported or documented in medical history or problem list   Health Maintenance Behaviors: Annual physical exam, Exercise, Healthy diet, Hobbies, Immunizations, Sleep adequate, Social activities, Spiritual practice(s), Stress management Healing Pattern: Average Health Facilitated by: Healthy diet, Pain control, Prayer/meditation, Rest, Stress management  Neurological Neurological Review of Symptoms: No symptoms reported Neurological Management Strategies: Routine screening Neurological Self-Management Outcome: 4 (good)  HEENT HEENT Symptoms Reported: No symptoms reported HEENT Management Strategies: Routine screening HEENT Self-Management Outcome: 4 (good)    Cardiovascular Cardiovascular Symptoms Reported: No symptoms reported Does patient have uncontrolled Hypertension?: No Cardiovascular Conditions: Chest pain, Dysrhythmia, Heart failure, Hypertension, High blood  cholesterol Cardiovascular Management Strategies: Routine screening, Medication therapy, Adequate rest, Diet modification, Exercise, Weight management Do You Have a Working Readable Scale?: Yes Weight: 191 lb (86.6 kg) Cardiovascular Self-Management Outcome: 4 (good)  Respiratory Respiratory Symptoms Reported: No symptoms reported Respiratory Conditions: Asthma, Seasonal allergies, Sleep disordered breathing Respiratory Self-Management Outcome: 4 (good)  Endocrine Patient reports the following symptoms related to hypoglycemia or hyperglycemia : No symptoms reported Is patient diabetic?: Yes Is patient checking blood sugars at home?: Yes Endocrine Conditions: Diabetes Endocrine Management Strategies: Diet modification, Fluid modification, Routine screening, Medication therapy Endocrine Self-Management Outcome: 4 (good) Endocrine Comment: on 70/30 insulin , metformin & mounjaro A1c  5.5, 5.5, 5.9, 7.5, 7.1, 7.0, 6.9, 6.6, 6.4, 6.7, 7.2 (6.4) (6.8), was 6.8  Gastrointestinal Gastrointestinal Symptoms Reported: Constipation Gastrointestinal Management Strategies: Medication therapy Gastrointestinal Self-Management Outcome: 4 (good)    Genitourinary Genitourinary Symptoms Reported: No symptoms reported Genitourinary Conditions: Other Other Genitourinary Conditions: menopausal Genitourinary Management Strategies: Adequate rest, Activity, Fluid modification Genitourinary Self-Management Outcome: 4 (good)  Integumentary Integumentary Symptoms Reported: No symptoms reported Skin Conditions: Other Other Skin Conditions: history of skin cancer - glomerosarcoma [facility]- cyst on left hand Skin Management Strategies: Routine screening Skin Self-Management Outcome: 4 (good)  Musculoskeletal Musculoskelatal Symptoms Reviewed: Other Other Musculoskeletal Symptoms: some nerve pain Musculoskeletal Conditions: Osteoarthritis, Other, Joint pain, Back pain Other Musculoskeletal Conditions: Degnerative changes  seen by Dr Carolynne Citron Orthopedic MD in [place]- Fibromyalgia Musculoskeletal Management Strategies: Medication therapy, Routine screening, Adequate rest, Exercise, Diet modification, Weight management Musculoskeletal Self-Management Outcome: 3 (uncertain)      Psychosocial Psychosocial Symptoms Reported: Depression - if selected complete PHQ 2-9, Anxiety - if selected complete GAD Behavioral Health Conditions: Depression, Anxiety Behavioral Management Strategies: Activity, Adequate rest, Coping strategies, Support system Behavioral Health Self-Management Outcome: 3 (uncertain) Major Change/Loss/Stressor/Fears (CP): Medical condition, self,  Relationship concerns Techniques to Cope with Loss/Stress/Change: Diversional activities, Withdraw Quality of Family Relationships: helpful, involved, supportive Do you feel physically threatened by others?: No      02/15/2024    6:10 PM  Depression screen PHQ 2/9  Decreased Interest 0  Down, Depressed, Hopeless 1  PHQ - 2 Score 1    There were no vitals filed for this visit.  Medications Reviewed Today     Reviewed by Arlyce Berger, RN (Registered Nurse) on 02/15/24 at 1602  Med List Status: <None>   Medication Order Taking? Sig Documenting Provider Last Dose Status Informant  acetaminophen  (TYLENOL ) 500 MG tablet [id number] Yes Take 500 mg by mouth every 6 (six) hours as needed for moderate pain. [provider] Taking Active Self, Pharmacy Records  albuterol  (VENTOLIN  HFA) 108 (90 Base) MCG/ACT inhaler [id number]  Inhale 1-2 puffs into the lungs every 6 (six) hours as needed for wheezing or shortness of breath. [provider]  Active Self, Pharmacy Records  aspirin  EC 325 MG tablet [id number]  Take 325 mg by mouth daily. [provider]  Active Self, Pharmacy Records  atorvastatin  (LIPITOR) 40 MG tablet [id number]  Take 40 mg by mouth daily. [provider]  Active Self, Pharmacy Records  Calcium  Carbonate-Vitamin  D (CALTRATE 600+D PO) [id number]  Take 1 tablet by mouth daily. [provider]  Active Self, Pharmacy Records  cetirizine (ZYRTEC) 10 MG tablet [id number] Yes Take 10 mg by mouth daily. [provider] Taking Active Self, Pharmacy Records  estradiol (ESTRACE) 1 MG tablet [id number]  Take 1 tablet by mouth at bedtime. [provider]  Active Self, Pharmacy Records  furosemide  (LASIX ) 40 MG tablet [id number]  TAKE ONE TABLET BY MOUTH EVERY OTHER DAY  Patient taking differently: Take 40 mg by mouth daily.   Elmyra Haggard, MD  Active Self, Pharmacy Records  loperamide (IMODIUM A-D) 2 MG tablet [id number]  Take 2 mg by mouth as needed for diarrhea or loose stools. [provider]  Active Self, Pharmacy Records  metFORMIN (GLUCOPHAGE-XR) 500 MG 24 hr tablet [id number] Yes Take 500 mg by mouth 2 (two) times daily. [provider] Taking Active Self, Pharmacy Records  MOUNJARO 5 MG/0.5ML Pen [id number] Yes Inject 5 mg into the skin once a week. [provider] Taking Active Self, Pharmacy Records  olmesartan (BENICAR) 40 MG tablet [id number]  Take 40 mg by mouth daily. [provider]  Active Self, Pharmacy Records  pantoprazole  (PROTONIX ) 40 MG tablet [id number]  Take 1 tablet (40 mg total) by mouth daily. Eustacio Highman, NP  Active Self, Pharmacy Records  sodium chloride  1 g tablet [id number]  Take 1 g by mouth 3 (three) times daily with meals. [provider]  Active   spironolactone  (ALDACTONE ) 50 MG tablet [id number]  Take 1 tablet (50 mg total) by mouth daily. Eustacio Highman, NP  Active Self, Pharmacy Records            Recommendation:   PCP Follow-up Specialty provider follow-up oncology, gastro, Orthopedic, Cardiology Diagnostic requests: mammogram, DEXA   Follow Up Plan:   Telephone follow up appointment date/time:  03/14/24 3 pm  Rosalva Neary L. Mcarthur Speedy, RN, BSN, CCM [place]  [facility]  Health RN Care Manager Direct Dial: [phone]  Fax: [phone]

## 2024-02-25 NOTE — Telephone Encounter (Signed)
 Called pt to schedule Rt renal embolization with Dr. Marlena Sima at [facility] on 5/28 at 9 am. Left VM for her to call me back to schedule. JM

## 2024-03-03 DIAGNOSIS — E119 Type 2 diabetes mellitus without complications: Secondary | ICD-10-CM | POA: Diagnosis not present

## 2024-03-03 DIAGNOSIS — K760 Fatty (change of) liver, not elsewhere classified: Secondary | ICD-10-CM | POA: Diagnosis not present

## 2024-03-03 DIAGNOSIS — I10 Essential (primary) hypertension: Secondary | ICD-10-CM | POA: Diagnosis not present

## 2024-03-07 ENCOUNTER — Other Ambulatory Visit: Payer: Self-pay | Admitting: Student

## 2024-03-07 ENCOUNTER — Other Ambulatory Visit: Payer: Self-pay | Admitting: Radiology

## 2024-03-07 DIAGNOSIS — N2889 Other specified disorders of kidney and ureter: Secondary | ICD-10-CM

## 2024-03-08 ENCOUNTER — Other Ambulatory Visit (HOSPITAL_COMMUNITY): Payer: Self-pay | Admitting: Interventional Radiology

## 2024-03-08 ENCOUNTER — Other Ambulatory Visit: Payer: Self-pay

## 2024-03-08 ENCOUNTER — Ambulatory Visit (HOSPITAL_COMMUNITY)
Admission: RE | Admit: 2024-03-08 | Discharge: 2024-03-08 | Disposition: A | Discharge status: Home / Self Care | Source: Ambulatory Visit | Attending: Interventional Radiology | Admitting: Interventional Radiology

## 2024-03-08 DIAGNOSIS — Z7982 Long term (current) use of aspirin: Secondary | ICD-10-CM | POA: Diagnosis not present

## 2024-03-08 DIAGNOSIS — D1771 Benign lipomatous neoplasm of kidney: Secondary | ICD-10-CM | POA: Insufficient documentation

## 2024-03-08 DIAGNOSIS — N2889 Other specified disorders of kidney and ureter: Secondary | ICD-10-CM

## 2024-03-08 HISTORY — PX: IR RENAL SUPRASEL UNI S&I MOD SED: IMG655

## 2024-03-08 HISTORY — PX: IR US GUIDE VASC ACCESS RIGHT: IMG2390

## 2024-03-08 HISTORY — PX: IR EMBO TUMOR ORGAN ISCHEMIA INFARCT INC GUIDE ROADMAPPING: IMG5449

## 2024-03-08 LAB — ABO/RH: ABO/RH(D): A POS

## 2024-03-08 LAB — TYPE AND SCREEN
ABO/RH(D): A POS
Antibody Screen: NEGATIVE

## 2024-03-08 LAB — GLUCOSE, CAPILLARY: Glucose-Capillary: 116 mg/dL — ABNORMAL HIGH (ref 70–99)

## 2024-03-08 LAB — BASIC METABOLIC PANEL WITH GFR
Anion gap: 10 (ref 5–15)
BUN: 17 mg/dL (ref 8–23)
CO2: 23 mmol/L (ref 22–32)
Calcium: 9.6 mg/dL (ref 8.9–10.3)
Chloride: 102 mmol/L (ref 98–111)
Creatinine, Ser: 1.01 mg/dL — ABNORMAL HIGH (ref 0.44–1.00)
GFR, Estimated: 60 mL/min — ABNORMAL LOW (ref >60)
Glucose, Bld: 114 mg/dL — ABNORMAL HIGH (ref 70–99)
Potassium: 3.6 mmol/L (ref 3.5–5.1)
Sodium: 135 mmol/L (ref 135–145)

## 2024-03-08 LAB — CBC
HCT: 38.6 % (ref 36.0–46.0)
Hemoglobin: 13.1 g/dL (ref 12.0–15.0)
MCH: 31.2 pg (ref 26.0–34.0)
MCHC: 33.9 g/dL (ref 30.0–36.0)
MCV: 91.9 fL (ref 80.0–100.0)
Platelets: 153 10*3/uL (ref 150–400)
RBC: 4.2 MIL/uL (ref 3.87–5.11)
RDW: 13.1 % (ref 11.5–15.5)
WBC: 5.3 10*3/uL (ref 4.0–10.5)
nRBC: 0 % (ref 0.0–0.2)

## 2024-03-08 LAB — PROTIME-INR
INR: 1 (ref 0.8–1.2)
Prothrombin Time: 13.5 s (ref 11.4–15.2)

## 2024-03-08 MED ORDER — CEFAZOLIN SODIUM-DEXTROSE 2-4 GM/100ML-% IV SOLN
INTRAVENOUS | Status: AC | PRN
  Administered 2024-03-08: 2 g via INTRAVENOUS

## 2024-03-08 MED ORDER — MIDAZOLAM HCL 2 MG/2ML IJ SOLN
INTRAMUSCULAR | Status: AC | PRN
  Administered 2024-03-08: 1 mg via INTRAVENOUS

## 2024-03-08 MED ORDER — MIDAZOLAM HCL 2 MG/2ML IJ SOLN
INTRAMUSCULAR | Status: AC
  Filled 2024-03-08: qty 2

## 2024-03-08 MED ORDER — NITROGLYCERIN 1 MG/10 ML FOR IR/CATH LAB
INTRA_ARTERIAL | Status: AC | PRN
  Administered 2024-03-08: 50 ug via INTRA_ARTERIAL

## 2024-03-08 MED ORDER — IOHEXOL 300 MG/ML  SOLN: 150.0000 mL | Freq: Once | INTRAMUSCULAR | Status: DC | PRN

## 2024-03-08 MED ORDER — LIDOCAINE HCL 1 % IJ SOLN
INTRAMUSCULAR | Status: AC
  Filled 2024-03-08: qty 20

## 2024-03-08 MED ORDER — NITROGLYCERIN 1 MG/10 ML FOR IR/CATH LAB
INTRA_ARTERIAL | Status: AC
  Filled 2024-03-08: qty 10

## 2024-03-08 MED ORDER — HYDROCODONE-ACETAMINOPHEN 5-325 MG PO TABS: 1.0000 | ORAL_TABLET | ORAL | Status: DC | PRN

## 2024-03-08 MED ORDER — FENTANYL CITRATE (PF) 100 MCG/2ML IJ SOLN
INTRAMUSCULAR | Status: AC | PRN
  Administered 2024-03-08 (×2): 25 ug via INTRAVENOUS

## 2024-03-08 MED ORDER — CEFAZOLIN SODIUM-DEXTROSE 2-4 GM/100ML-% IV SOLN: 2.0000 g | INTRAVENOUS | Status: DC

## 2024-03-08 MED ORDER — FENTANYL CITRATE (PF) 100 MCG/2ML IJ SOLN
INTRAMUSCULAR | Status: AC
Start: 2024-03-08 — End: ?
  Filled 2024-03-08: qty 2

## 2024-03-08 MED ORDER — CEFAZOLIN SODIUM-DEXTROSE 2-4 GM/100ML-% IV SOLN
INTRAVENOUS | Status: AC
  Filled 2024-03-08: qty 100

## 2024-03-08 NOTE — Procedures (Signed)
  Procedure:  Renal arteriogram and subselective AML embolization [id number] Embospheres to stasis Preprocedure diagnosis: The encounter diagnosis was Right renal mass. Postprocedure diagnosis: same EBL:    minimal Complications:   none immediate  See full dictation in [employer].  Nicky Barrack MD Main # [phone] Pager  [phone] Mobile [phone]

## 2024-03-08 NOTE — Sedation Documentation (Signed)
 Patient transported to recovery area via stretcher. Bedside report given to RN. Femoral site assessed - Level 0, no hematoma, dressing is clean, dry, and intact. Pulses also assessed bilaterally.

## 2024-03-08 NOTE — H&P (Signed)
 Chief Complaint: Right renal angiomyolipoma - IR consulted for angiomyolipoma embolization  Referring Provider(s): McKenzie,Patrick L   Supervising Physician: Marland Silvas  Patient Status: [facility] - Out-pt  History of Present Illness: Ebony Stevens is a 73 y.o. female with right renal angiomyolipoma presenting to IR service for angiography and embolization. Pt initially met with Dr. Marlena Sima in outpatient consult regarding this. At that time, pt was having some mild R flank pain which has persisted. Recent MRI abdomen from 11/12/23, showing 3.4 x 3.2 cm fat containing mass consistent with benign renal angiomyolipoma. As discussed in outpatient visit, there is risk of spontaneous hemorrhage, and recommendation was for embolization, and pt is agreeable with this.   Pt has been npo since midnight aside from small sip with meds this morning. Pt reports today is 5th day of holding her ASA, no other blood thinners. No other new complaints.   Patient is Full Code  Past Medical History:  Diagnosis Date   Asthma    Chest pain 07/11/2015   Diabetes mellitus without complication (HCC)    Dyspnea 07/11/2015   Early cirrhosis (HCC) 2020   Excessive bleeding in the premenopausal period    Fibromyalgia    GERD (gastroesophageal reflux disease)    HTN (hypertension)    Hypertension    Localized edema    Mixed hyperlipidemia    PONV (postoperative nausea and vomiting)    Sleep apnea    Type 2 diabetes mellitus (HCC)     Past Surgical History:  Procedure Laterality Date   ABDOMINAL HYSTERECTOMY     1975 partial hysterectomy,  1978 "complete hysterctomy and retack bladder". states it didn't take.    BIOPSY  11/10/2018   Procedure: BIOPSY;  Surgeon: Suzette Espy, MD;  [facility];  Service: Endoscopy;;  colon    CHOLECYSTECTOMY     COLONOSCOPY WITH PROPOFOL  N/A 11/10/2018   Surgeon: Suzette Espy, MD;  1 hyperplastic polyp, random colon biopsies benign, due for repeat  in 2025 due to family history of colon cancer.   DILATION AND CURETTAGE OF UTERUS     ESOPHAGOGASTRODUODENOSCOPY (EGD) WITH PROPOFOL  N/A 08/21/2021   Surgeon: Suzette Espy, MD; Mild Schatzki's ring dilated, small hiatal hernia, no portal gastropathy, normal examined duodenum.   MALONEY DILATION  08/21/2021   Procedure: MALONEY DILATION;  Surgeon: Suzette Espy, MD;  [facility];  Service: Endoscopy;;   POLYPECTOMY  11/10/2018   Procedure: POLYPECTOMY;  Surgeon: Suzette Espy, MD;  [facility];  Service: Endoscopy;;  colon    TONSILLECTOMY      Allergies: Patient has no known allergies.  Medications: Prior to Admission medications   Medication Sig Start Date End Date Taking? Authorizing Provider  atorvastatin  (LIPITOR) 40 MG tablet Take 40 mg by mouth daily. 09/20/20  Yes [provider]  Calcium  Carbonate-Vitamin D (CALTRATE 600+D PO) Take 1 tablet by mouth daily.   Yes [provider]  cetirizine (ZYRTEC) 10 MG tablet Take 10 mg by mouth daily.   Yes [provider]  estradiol (ESTRACE) 1 MG tablet Take 1 tablet by mouth at bedtime. 03/27/19  Yes [provider]  furosemide  (LASIX ) 40 MG tablet TAKE ONE TABLET BY MOUTH EVERY OTHER DAY Patient taking differently: Take 40 mg by mouth daily. 05/28/17  Yes Elmyra Haggard, MD  metFORMIN (GLUCOPHAGE-XR) 500 MG 24 hr tablet Take 500 mg by mouth 2 (two) times daily.   Yes [provider]  MOUNJARO 5 MG/0.5ML Pen Inject 5 mg into the skin once a week. 05/26/23  Yes [provider]  pantoprazole  (PROTONIX ) 40 MG tablet Take 1 tablet (40 mg total) by mouth daily. 12/29/23  Yes Mahon, Martine Sleek, NP  spironolactone  (ALDACTONE ) 50 MG tablet Take 1 tablet (50 mg total) by mouth daily. 12/29/23 12/28/24 Yes Mahon, Martine Sleek, NP  acetaminophen  (TYLENOL ) 500 MG tablet Take 500 mg by mouth every 6 (six) hours as needed for moderate pain.    [provider]  albuterol   (VENTOLIN  HFA) 108 (90 Base) MCG/ACT inhaler Inhale 1-2 puffs into the lungs every 6 (six) hours as needed for wheezing or shortness of breath.    [provider]  aspirin  EC 325 MG tablet Take 325 mg by mouth daily.    [provider]  loperamide (IMODIUM A-D) 2 MG tablet Take 2 mg by mouth as needed for diarrhea or loose stools.    [provider]  olmesartan (BENICAR) 40 MG tablet Take 40 mg by mouth daily. 11/28/23   [provider]  sodium chloride  1 g tablet Take 1 g by mouth 3 (three) times daily with meals.    [provider]     Family History  Problem Relation Age of Onset   Cancer Sister        ovarian   Diabetes Brother    Colon cancer Brother 8       Passed from [facility] after 1 year    Social History   Socioeconomic History   Marital status: Married    Spouse name: Not on file   Number of children: Not on file   Years of education: Not on file   Highest education level: Not on file  Occupational History   Not on file  Tobacco Use   Smoking status: Never   Smokeless tobacco: Never  Vaping Use   Vaping status: Never Used  Substance and Sexual Activity   Alcohol use: No   Drug use: No   Sexual activity: Not Currently  Other Topics Concern   Not on file  Social History Narrative   ** Merged History Encounter **       Social Drivers of Health   Financial Resource Strain: Low Risk  (09/01/2023)   Overall Financial Resource Strain (CARDIA)    Difficulty of Paying Living Expenses: Not very hard  Food Insecurity: No Food Insecurity (01/17/2024)   Hunger Vital Sign    Worried About Running Out of Food in the Last Year: Never true    Ran Out of Food in the Last Year: Never true  Transportation Needs: No Transportation Needs (01/17/2024)   PRAPARE - Administrator, Civil Service (Medical): No    Lack of Transportation (Non-Medical): No  Physical Activity: Insufficiently Active (01/17/2024)   Exercise Vital Sign     Days of Exercise per Week: 3 days    Minutes of Exercise per Session: 30 min  Stress: No Stress Concern Present (01/17/2024)   [employer] of Occupational Health - Occupational Stress Questionnaire    Feeling of Stress : Only a little  Social Connections: Moderately Isolated (01/04/2024)   Social Connection and Isolation Panel [NHANES]    Frequency of Communication with Friends and Family: Once a week    Frequency of Social Gatherings with Friends and Family: Once a week    Attends Religious Services: 1 to 4 times per year    Active Member of Clubs or Organizations: No  Attends Banker Meetings: Never    Marital Status: Married     Review of Systems: A 12 point ROS discussed and pertinent positives are indicated in the HPI above.  All other systems are negative.    Vital Signs: BP 136/63   Pulse (!) 56   Temp 98.3 F (36.8 C) (Oral)   Resp 17   Ht 5\' 2"  (1.575 m)   Wt 185 lb (83.9 kg)   SpO2 98%   BMI 33.84 kg/m   Advance Care Plan: No documents on file  Physical Exam Vitals and nursing note reviewed.  Constitutional:      Appearance: Normal appearance.  HENT:     Mouth/Throat:     Mouth: Mucous membranes are moist.     Pharynx: Oropharynx is clear.  Cardiovascular:     Rate and Rhythm: Normal rate and regular rhythm.  Pulmonary:     Effort: Pulmonary effort is normal.     Breath sounds: Normal breath sounds.  Abdominal:     Palpations: Abdomen is soft.     Tenderness: There is no abdominal tenderness.     Comments: Mild right flank ttp  Musculoskeletal:     Right lower leg: No edema.     Left lower leg: No edema.  Skin:    General: Skin is warm and dry.  Neurological:     Mental Status: She is alert and oriented to person, place, and time. Mental status is at baseline.     Imaging: No results found.  Labs:  CBC: Recent Labs    08/10/23 1524 01/03/24 1333 01/03/24 1404 01/04/24 0524 03/08/24 0752  WBC 6.1 8.7  --  5.4 5.3   HGB 12.3 13.3 10.5* 12.1 13.1  HCT 36.5 38.4 31.0* 35.9* 38.6  PLT 201 203  --  151 153    COAGS: Recent Labs    08/10/23 1524 03/08/24 0752  INR 1.0 1.0    BMP: Recent Labs    07/18/23 0351 07/19/23 0350 08/10/23 1524 01/03/24 1333 01/03/24 1404 01/04/24 0524  NA 124* 126* 126* 129* 130* 129*  K 4.8 4.4 5.1 4.4 4.8 3.9  CL 93* 95* 87* 97* 102 102  CO2 23 23 19* 18*  --  18*  GLUCOSE 148* 116* 108* 131* 118* 84  BUN 16 15 14  32* 38* 24*  CALCIUM  9.1 8.7* 9.4 9.0  --  8.3*  CREATININE 1.25* 1.22* 1.42* 1.58* 1.50* 0.99  GFRNONAA 46* 48*  --  35*  --  >60    LIVER FUNCTION TESTS: Recent Labs    07/16/23 0514 07/17/23 0328 08/10/23 1524  BILITOT 0.9 1.0 1.1  AST 34 28 29  ALT 27 24 22   ALKPHOS 66 63 102  PROT 6.2* 5.8* 6.9  ALBUMIN 4.0 3.6 4.5    TUMOR MARKERS: No results for input(s): "AFPTM", "CEA", "CA199", "CHROMGRNA" in the last 8760 hours.  Assessment and Plan:  Ebony Stevens is a 73 y.o. female with right renal angiomyolipoma presenting to IR service for angiography and embolization. Pt initially met with Dr. Marlena Sima in outpatient consult regarding this. At that time, pt was having some mild R flank pain which has persisted. Recent MRI abdomen from 11/12/23, showing 3.4 x 3.2 cm fat containing mass consistent with benign renal angiomyolipoma. As discussed in outpatient visit, there is risk of spontaneous hemorrhage, and recommendation was for embolization, and pt is agreeable with this.   Pt has been npo since midnight aside from small sip with  meds this morning. Pt reports today is 5th day of holding her ASA, no other blood thinners. No other new complaints.  The Risks and benefits of right renal angiomyolipoma embolization were discussed with the patient including, but not limited to bleeding, infection, vascular injury, post operative pain, or contrast induced renal failure.  This procedure involves the use of X-rays and because of the nature of the  planned procedure, it is possible that we will have prolonged use of X-ray fluoroscopy.  Potential radiation risks to you include (but are not limited to) the following: - A slightly elevated risk for cancer several years later in life. This risk is typically less than 0.5% percent. This risk is low in comparison to the normal incidence of human cancer, which is 33% for women and 50% for men according to the American Cancer Society. - Radiation induced injury can include skin redness, resembling a rash, tissue breakdown / ulcers and hair loss (which can be temporary or permanent).   The likelihood of either of these occurring depends on the difficulty of the procedure and whether you are sensitive to radiation due to previous procedures, disease, or genetic conditions.   IF your procedure requires a prolonged use of radiation, you will be notified and given written instructions for further action.  It is your responsibility to monitor the irradiated area for the 2 weeks following the procedure and to notify your physician if you are concerned that you have suffered a radiation induced injury.    All of the patient's questions were answered, patient is agreeable to proceed. Consent signed and in chart.   Thank you for allowing our service to participate in Ebony Stevens 's care.  Electronically Signed: Nicolasa Barrett, PA-C   03/08/2024, 9:05 AM      I spent a total of    25 Minutes in face to face in clinical consultation, greater than 50% of which was counseling/coordinating care for right renal angiomyolipoma embolization.

## 2024-03-14 ENCOUNTER — Encounter: Payer: Self-pay | Admitting: *Deleted

## 2024-03-14 ENCOUNTER — Other Ambulatory Visit: Payer: Self-pay | Admitting: *Deleted

## 2024-03-14 DIAGNOSIS — K7460 Unspecified cirrhosis of liver: Secondary | ICD-10-CM | POA: Diagnosis not present

## 2024-03-15 ENCOUNTER — Ambulatory Visit: Payer: Self-pay | Admitting: Gastroenterology

## 2024-03-15 LAB — HEPATIC FUNCTION PANEL
ALT: 18 IU/L (ref 0–32)
AST: 26 IU/L (ref 0–40)
Albumin: 3.9 g/dL (ref 3.8–4.8)
Alkaline Phosphatase: 103 IU/L (ref 44–121)
Bilirubin Total: 0.6 mg/dL (ref 0.0–1.2)
Bilirubin, Direct: 0.26 mg/dL (ref 0.00–0.40)
Total Protein: 6.1 g/dL (ref 6.0–8.5)

## 2024-03-15 LAB — PROTIME-INR
INR: 1 (ref 0.9–1.2)
Prothrombin Time: 11 s (ref 9.1–12.0)

## 2024-03-22 ENCOUNTER — Ambulatory Visit: Attending: Internal Medicine | Admitting: Internal Medicine

## 2024-03-22 ENCOUNTER — Encounter: Payer: Self-pay | Admitting: Internal Medicine

## 2024-03-22 VITALS — BP 122/70 | HR 52 | Ht 62.0 in | Wt 182.6 lb

## 2024-03-22 DIAGNOSIS — R079 Chest pain, unspecified: Secondary | ICD-10-CM | POA: Diagnosis not present

## 2024-03-22 DIAGNOSIS — R42 Dizziness and giddiness: Secondary | ICD-10-CM | POA: Diagnosis not present

## 2024-03-22 DIAGNOSIS — R0609 Other forms of dyspnea: Secondary | ICD-10-CM | POA: Diagnosis not present

## 2024-03-22 NOTE — Patient Instructions (Signed)
 Medication Instructions:  Your physician recommends that you continue on your current medications as directed. Please refer to the Current Medication list given to you today.   Labwork: None  Testing/Procedures: Your physician has requested that you have a lexiscan myoview. For further information please visit [email or web address] Please follow instruction sheet, as given.  Your physician has recommended that you wear a Zio monitor.   This monitor is a medical device that records the heart's electrical activity. Doctors most often use these monitors to diagnose arrhythmias. Arrhythmias are problems with the speed or rhythm of the heartbeat. The monitor is a small device applied to your chest. You can wear one while you do your normal daily activities. While wearing this monitor if you have any symptoms to push the button and record what you felt. Once you have worn this monitor for the period of time provider prescribed (for 14 days), you will return the monitor device in the postage paid box. Once it is returned they will download the data collected and provide us  with a report which the provider will then review and we will call you with those results. Important tips:  Avoid showering during the first 24 hours of wearing the monitor. Avoid excessive sweating to help maximize wear time. Do not submerge the device, no hot tubs, and no swimming pools. Keep any lotions or oils away from the patch. After 24 hours you may shower with the patch on. Take brief showers with your back facing the shower head.  Do not remove patch once it has been placed because that will interrupt data and decrease adhesive wear time. Push the button when you have any symptoms and write down what you were feeling. Once you have completed wearing your monitor, remove and place into box which has postage paid and place in your outgoing mailbox.  If for some reason you have misplaced your box then call our office and we can  provide another box and/or mail it off for you.   Follow-Up: Your physician recommends that you schedule a follow-up appointment in: Pending Results  Any Other Special Instructions Will Be Listed Below (If Applicable). Thank you for choosing [facility]!     If you need a refill on your cardiac medications before your next appointment, please call your pharmacy.

## 2024-03-22 NOTE — Progress Notes (Signed)
 Cardiology Office Note  Date: 03/22/2024   ID: ZAMORIA BOSS, Reinaldo Caras [birth year], MRN [id number]  PCP:  Omie Bickers, MD  Cardiologist:  Lasalle Pointer, MD Electrophysiologist:  None   History of Present Illness: Ebony Stevens is a 73 y.o. female  Referred to cardiology clinic for evaluation of bradycardia, chest pain and DOE.  She has heart rates as low as 42 at home.  Mostly it is in the late 78s or early 20s.  She complains of exertional dizziness.  She also had 1 episode of syncope in March 2025 requiring hospitalization.  It was deemed to be from severe hyponatremia and after taking medications to improve her sodium levels, she did not have any recurrence of syncope.  However she continued to have exertional dizziness.  She cannot walk on the treadmill to evaluate for chronotropic competence.  She also has ongoing chest pain and DOE for a long time, many years.  DOE has been stable, no recent worsening.  Chest pressure/pain occurs daily, varies between minutes and hours.  Resolve spontaneously.  No prior ischemia evaluation.  Past Medical History:  Diagnosis Date   Asthma    Chest pain 07/11/2015   Diabetes mellitus without complication (HCC)    Dyspnea 07/11/2015   Early cirrhosis (HCC) 2020   Excessive bleeding in the premenopausal period    Fibromyalgia    GERD (gastroesophageal reflux disease)    HTN (hypertension)    Hypertension    Localized edema    Mixed hyperlipidemia    PONV (postoperative nausea and vomiting)    Sleep apnea    Type 2 diabetes mellitus (HCC)     Past Surgical History:  Procedure Laterality Date   ABDOMINAL HYSTERECTOMY     1975 partial hysterectomy,  1978 complete hysterctomy and retack bladder. states it didn't take.    BIOPSY  11/10/2018   Procedure: BIOPSY;  Surgeon: Suzette Espy, MD;  [facility];  Service: Endoscopy;;  colon    CHOLECYSTECTOMY     COLONOSCOPY WITH PROPOFOL  N/A 11/10/2018   Surgeon: Suzette Espy, MD;  1 hyperplastic polyp, random colon biopsies benign, due for repeat in 2025 due to family history of colon cancer.   DILATION AND CURETTAGE OF UTERUS     ESOPHAGOGASTRODUODENOSCOPY (EGD) WITH PROPOFOL  N/A 08/21/2021   Surgeon: Suzette Espy, MD; Mild Schatzki's ring dilated, small hiatal hernia, no portal gastropathy, normal examined duodenum.   IR EMBO TUMOR ORGAN ISCHEMIA INFARCT INC GUIDE ROADMAPPING  03/08/2024   IR RENAL SUPRASEL UNI S&I MOD SED  03/08/2024   IR US  GUIDE VASC ACCESS RIGHT  03/08/2024   MALONEY DILATION  08/21/2021   Procedure: MALONEY DILATION;  Surgeon: Suzette Espy, MD;  [facility];  Service: Endoscopy;;   POLYPECTOMY  11/10/2018   Procedure: POLYPECTOMY;  Surgeon: Suzette Espy, MD;  [facility];  Service: Endoscopy;;  colon    TONSILLECTOMY      Current Outpatient Medications  Medication Sig Dispense Refill   acetaminophen  (TYLENOL ) 500 MG tablet Take 500 mg by mouth every 6 (six) hours as needed for moderate pain.     aspirin  EC 325 MG tablet Take 325 mg by mouth daily.     atorvastatin  (LIPITOR) 40 MG tablet Take 40 mg by mouth daily.     Calcium  Carbonate-Vitamin D (CALTRATE 600+D PO) Take 1 tablet by mouth daily.     cetirizine (ZYRTEC) 10 MG tablet Take 10 mg  by mouth daily.     estradiol (ESTRACE) 1 MG tablet Take 1 tablet by mouth at bedtime.     furosemide  (LASIX ) 40 MG tablet TAKE ONE TABLET BY MOUTH EVERY OTHER DAY (Patient taking differently: Take 40 mg by mouth daily.) 15 tablet 2   loperamide (IMODIUM A-D) 2 MG tablet Take 2 mg by mouth as needed for diarrhea or loose stools.     metFORMIN (GLUCOPHAGE-XR) 500 MG 24 hr tablet Take 500 mg by mouth daily.     MOUNJARO 5 MG/0.5ML Pen Inject 5 mg into the skin once a week.     pantoprazole  (PROTONIX ) 40 MG tablet Take 1 tablet (40 mg total) by mouth daily. 90 tablet 3   sodium chloride  1 g tablet Take 1 g by mouth 3 (three) times daily with meals.      spironolactone  (ALDACTONE ) 50 MG tablet Take 1 tablet (50 mg total) by mouth daily. 30 tablet 11   No current facility-administered medications for this visit.   Allergies:  Patient has no known allergies.   Social History: The patient  reports that she has never smoked. She has never used smokeless tobacco. She reports that she does not drink alcohol and does not use drugs.   Family History: The patient's family history includes Cancer in her sister; Colon cancer (age of onset: 54) in her brother; Diabetes in her brother; Heart Problems in her maternal grandmother; High blood pressure in her mother.   ROS:  Please see the history of present illness. Otherwise, complete review of systems is positive for none  All other systems are reviewed and negative.   Physical Exam: VS:  BP 122/70   Pulse (!) 52   Ht 5' 2 (1.575 m)   Wt 182 lb 9.6 oz (82.8 kg)   SpO2 98%   BMI 33.40 kg/m , BMI Body mass index is 33.4 kg/m.  Wt Readings from Last 3 Encounters:  03/22/24 182 lb 9.6 oz (82.8 kg)  03/14/24 184 lb (83.5 kg)  03/08/24 185 lb (83.9 kg)    General: Patient appears comfortable at rest. HEENT: Conjunctiva and lids normal, oropharynx clear with moist mucosa. Neck: Supple, no elevated JVP or carotid bruits, no thyromegaly. Lungs: Clear to auscultation, nonlabored breathing at rest. Cardiac: Regular rate and rhythm, no S3 or significant systolic murmur, no pericardial rub. Abdomen: Soft, nontender, no hepatomegaly, bowel sounds present, no guarding or rebound. Extremities: No pitting edema, distal pulses 2+. Skin: Warm and dry. Musculoskeletal: No kyphosis. Neuropsychiatric: Alert and oriented x3, affect grossly appropriate.  Recent Labwork: 07/16/2023: TSH 3.202 07/19/2023: Magnesium  1.7 03/08/2024: BUN 17; Creatinine, Ser 1.01; Hemoglobin 13.1; Platelets 153; Potassium 3.6; Sodium 135 03/14/2024: ALT 18; AST 26     Component Value Date/Time   CHOL 96 (L) 08/10/2023 1524   TRIG 154  (H) 08/10/2023 1524   HDL 51 07/15/2023 2024   CHOLHDL 1.5 07/15/2023 2024   VLDL 14 07/15/2023 2024   LDLCALC 13 07/15/2023 2024    Assessment and Plan:  Chest pain and DOE: Ongoing chest pain and DOE for many years.  Stable DOE, no recent worsening.  Chest pain/pressure occurs almost daily, varies between minutes and hours.  No relation to rest or exercise.  Low suspicion for ischemia however due to advanced age and risk factors, obtain Lexiscan.  Echocardiogram was unremarkable, no evidence of diastolic heart failure or systolic heart failure.  No valvular heart disease either.  Exertional dizziness and bradycardia: Dizziness is not positional but exertional.  Obtain 2-week event monitor, live to rule out any conduction system abnormalities.  Cannot walk on the treadmill, cannot evaluate chronotropic competence.  Has OSA, compliant with CPAP.  Syncope in March 2025 likely from severe hyponatremia: After taking salt tablets, her sodium improved and she did not have any recurrences of syncope.  Will obtain event monitor anyway for the exertional dizziness.       Medication Adjustments/Labs and Tests Ordered: Current medicines are reviewed at length with the patient today.  Concerns regarding medicines are outlined above.    Disposition:  Follow up pending results  Signed Cera Rorke Priya Kwaku Mostafa, MD, 03/22/2024 2:15 PM    [facility] [address] [place], [place], [place] [address]

## 2024-03-23 ENCOUNTER — Other Ambulatory Visit

## 2024-03-23 ENCOUNTER — Other Ambulatory Visit: Payer: Self-pay | Admitting: Internal Medicine

## 2024-03-23 ENCOUNTER — Telehealth: Payer: Self-pay | Admitting: Internal Medicine

## 2024-03-23 DIAGNOSIS — E119 Type 2 diabetes mellitus without complications: Secondary | ICD-10-CM | POA: Diagnosis not present

## 2024-03-23 DIAGNOSIS — E7800 Pure hypercholesterolemia, unspecified: Secondary | ICD-10-CM | POA: Diagnosis not present

## 2024-03-23 DIAGNOSIS — M71342 Other bursal cyst, left hand: Secondary | ICD-10-CM | POA: Diagnosis not present

## 2024-03-23 DIAGNOSIS — I10 Essential (primary) hypertension: Secondary | ICD-10-CM | POA: Diagnosis not present

## 2024-03-23 DIAGNOSIS — M67442 Ganglion, left hand: Secondary | ICD-10-CM | POA: Diagnosis not present

## 2024-03-23 DIAGNOSIS — R42 Dizziness and giddiness: Secondary | ICD-10-CM

## 2024-03-23 DIAGNOSIS — Z79899 Other long term (current) drug therapy: Secondary | ICD-10-CM | POA: Diagnosis not present

## 2024-03-23 NOTE — Telephone Encounter (Signed)
 Checking percert on the following enrolled 2 week AT monitor on 03/23/2024/[place]

## 2024-03-24 NOTE — Addendum Note (Signed)
 Addended by: Adell Koval G on: 03/24/2024 02:07 PM   Modules accepted: Orders

## 2024-03-28 ENCOUNTER — Encounter: Payer: Self-pay | Admitting: *Deleted

## 2024-03-28 ENCOUNTER — Other Ambulatory Visit: Payer: Self-pay | Admitting: *Deleted

## 2024-03-28 ENCOUNTER — Other Ambulatory Visit: Payer: Self-pay

## 2024-03-28 NOTE — Patient Instructions (Signed)
 Visit Information  Thank you for taking time to visit with me today. Please don't hesitate to contact me if I can be of assistance to you before our next scheduled appointment.  Your next care management appointment is by telephone on 05/23/24 at 3:45 pm  Keep up the good work. PROUD of YOU !!!!  Please call the care guide team at [phone] if you need to cancel, schedule, or reschedule an appointment.   Please call the Suicide and Crisis Lifeline: 988 call the [place]  National Suicide Prevention Lifeline: 4704942904 or TTY: 302-560-9957 TTY (703)348-6197) to talk to a trained counselor call 1-800-273-TALK (toll free, 24 hour hotline) call the [facility]: [phone] call 911 if you are experiencing a Mental Health or Behavioral Health Crisis or need someone to talk to.  Ebony Cubit L. Mcarthur Speedy, RN, BSN, CCM [facility]  [facility], [facility] RN Care Manager Direct Dial: [phone]  Fax: [phone]

## 2024-03-29 ENCOUNTER — Telehealth (HOSPITAL_COMMUNITY): Payer: Self-pay

## 2024-03-29 ENCOUNTER — Encounter: Payer: Self-pay | Admitting: *Deleted

## 2024-03-29 NOTE — Patient Outreach (Addendum)
 Complex Care Management   Visit Note  04/25/2024 updated of follow up date on 04/25/24 for this 03/28/24 note  Name:  Ebony Stevens MRN: [id number] DOB: [birth year]  Situation: Referral received for Complex Care Management related to Heart Failure I obtained verbal consent from Patient.  Visit completed with Aashritha D Eubanks  on the phone  Patient reports she is doing well. Her recent procedure for the cyst on her finger was tolerated well. She denies any pain, swelling, drainage. She has kept the dressing intact. She reports being pleased with the results  Patient continues to manage her other major medical diagnoses without voiced concern or reported worsening symptoms  Patient continues to decrease her weight, losing 2-3 pounds a week and is very satisfied with her progress She was commended for working diligently towards her goals   Pending mammogram & bone density on 04/26/24 Still pending shingles, eye exam, DTap Had covid vaccines All initial & boosters but no 2025 boosters Last Hg A1c done on 02/01/24 at 5.5 Last foot exam & urine DM kidney evaluation on 02/07/24 pcp, Unable to enter in EPIC   Background:   Past Medical History:  Diagnosis Date   Asthma    Chest pain 07/11/2015   Diabetes mellitus without complication (HCC)    Dyspnea 07/11/2015   Early cirrhosis (HCC) 2020   Excessive bleeding in the premenopausal period    Fibromyalgia    GERD (gastroesophageal reflux disease)    HTN (hypertension)    Hypertension    Localized edema    Mixed hyperlipidemia    PONV (postoperative nausea and vomiting)    Sleep apnea    Type 2 diabetes mellitus (HCC)     Assessment: Patient Reported Symptoms:  Cognitive Cognitive Status: Alert and oriented to person, place, and time, Insightful and able to interpret abstract concepts, Normal speech and language skills      Neurological Neurological Review of Symptoms: No symptoms reported Neurological Self-Management Outcome: 4  (good)  HEENT HEENT Symptoms Reported: No symptoms reported HEENT Self-Management Outcome: 4 (good)    Cardiovascular Cardiovascular Symptoms Reported: No symptoms reported Weight: 179 lb (81.2 kg) (correction KLG) Cardiovascular Self-Management Outcome: 4 (good)  Respiratory Respiratory Symptoms Reported: No symptoms reported Respiratory Self-Management Outcome: 4 (good)  Endocrine Endocrine Symptoms Reported: No symptoms reported Endocrine Self-Management Outcome: 5 (very good)  Gastrointestinal Gastrointestinal Symptoms Reported: Constipation, Obesity Gastrointestinal Management Strategies: Adequate rest, Medication therapy, Diet modification, Fluid modification Gastrointestinal Self-Management Outcome: 3 (uncertain) Nutrition Risk Screen (CP): No indicators present  Genitourinary Genitourinary Symptoms Reported: No symptoms reported Genitourinary Self-Management Outcome: 4 (good)  Integumentary Integumentary Symptoms Reported: Other Other Integumentary Symptoms: post surgery hand Additional Integumentary Details: cyst on left index finger excised & healed Skin Management Strategies: Adequate rest, Routine screening Skin Self-Management Outcome: 4 (good)  Musculoskeletal Musculoskelatal Symptoms Reviewed: No symptoms reported Musculoskeletal Self-Management Outcome: 4 (good) Falls in the past year?: No Number of falls in past year: 1 or less Was there an injury with Fall?: No Fall Risk Category Calculator: 0 Patient Fall Risk Level: Low Fall Risk Fall risk Follow up: Falls evaluation completed  Psychosocial Psychosocial Symptoms Reported: No symptoms reported Behavioral Health Self-Management Outcome: 4 (good)          03/29/2024   10:58 AM  Depression screen PHQ 2/9  Decreased Interest 0  Down, Depressed, Hopeless 0  PHQ - 2 Score 0    There were no vitals filed for this visit.  Medications Reviewed Today   Medications  were not reviewed in this encounter      Recommendation:   05/12/24 labs, 05/18/24 PCP Follow-up Continue Current Plan of Care  Follow Up Plan:   Telephone follow up appointment date/time:  05/23/24 3:45 pm   Suzen L. Ramonita, RN, BSN, CCM [place]  [facility], [facility] Health RN Care Manager Direct Dial: [phone]  Fax: [phone]

## 2024-03-30 ENCOUNTER — Encounter (HOSPITAL_COMMUNITY): Admission: RE | Admit: 2024-03-30 | Source: Ambulatory Visit

## 2024-03-30 ENCOUNTER — Encounter (HOSPITAL_COMMUNITY)

## 2024-04-05 DIAGNOSIS — R42 Dizziness and giddiness: Secondary | ICD-10-CM | POA: Diagnosis not present

## 2024-04-17 ENCOUNTER — Telehealth: Payer: Self-pay | Admitting: Internal Medicine

## 2024-04-17 NOTE — Telephone Encounter (Signed)
 [facility] calling with abnormal findings. Caller hung up before call was transferred to nurse.

## 2024-04-17 NOTE — Telephone Encounter (Signed)
 Caller Versie) is reporting additional findings

## 2024-04-18 NOTE — Telephone Encounter (Signed)
 Called [facility]  They received notice of 2 different patient notifications  Bradycardia 40 BPM- 1 Minute long June 13th 8:40 PM  Bradycardia 39 BPM- 1 BPM June 20th 5:52 Am  Monitor is being processed currently

## 2024-04-25 NOTE — Patient Instructions (Signed)
 Visit Information  Thank you for taking time to visit with me today. Please don't hesitate to contact me if I can be of assistance to you before our next scheduled appointment.  Your next care management appointment is by telephone on 03/28/24 at 3:45 pm    Please call the care guide team at [phone] if you need to cancel, schedule, or reschedule an appointment.   Please call the Suicide and Crisis Lifeline: 988 call the [place]  National Suicide Prevention Lifeline: 626 680 0662 or TTY: (440) 576-8033 TTY (567) 382-1407) to talk to a trained counselor call 1-800-273-TALK (toll free, 24 hour hotline) call the [facility]: [phone] call 911 if you are experiencing a Mental Health or Behavioral Health Crisis or need someone to talk to.  Janayah Zavada L. Ramonita, RN, BSN, CCM [place]  [facility], [facility] Health RN Care Manager Direct Dial: [phone]  Fax: [phone]

## 2024-04-25 NOTE — Patient Outreach (Signed)
 Complex Care Management   Visit Note  04/25/2024 late entry note for 03/14/24  Name:  Ebony Stevens MRN: [id number] DOB: 12/15/1950  Situation: Referral received for Complex Care Management related to Heart Failure I obtained verbal consent from Patient.  Visit completed with Ebony Stevens  on the phone  Background:   Past Medical History:  Diagnosis Date   Asthma    Chest pain 07/11/2015   Diabetes mellitus without complication (HCC)    Dyspnea 07/11/2015   Early cirrhosis (HCC) 2020   Excessive bleeding in the premenopausal period    Fibromyalgia    GERD (gastroesophageal reflux disease)    HTN (hypertension)    Hypertension    Localized edema    Mixed hyperlipidemia    PONV (postoperative nausea and vomiting)    Sleep apnea    Type 2 diabetes mellitus (HCC)     Assessment: Patient Reported Symptoms:  Cognitive Cognitive Status: Able to follow simple commands, Normal speech and language skills, Alert and oriented to person, place, and time Cognitive/Intellectual Conditions Management [RPT]: None reported or documented in medical history or problem list   Healing Pattern: Average Health Facilitated by: Pain control, Rest  Neurological Neurological Review of Symptoms: Headaches Neurological Self-Management Outcome: 3 (uncertain)  HEENT   HEENT Management Strategies: Adequate rest, Activity, Medication therapy, Routine screening    Cardiovascular Cardiovascular Symptoms Reported: No symptoms reported Weight: 184 lb (83.5 kg)  Respiratory Respiratory Symptoms Reported: No symptoms reported Respiratory Self-Management Outcome: 4 (good)  Endocrine Endocrine Symptoms Reported: No symptoms reported Endocrine Self-Management Outcome: 4 (good)  Gastrointestinal Gastrointestinal Symptoms Reported: No symptoms reported Gastrointestinal Self-Management Outcome: 4 (good)    Genitourinary Genitourinary Symptoms Reported: Other Other Genitourinary Symptoms: benign tumor of  right kidney Genitourinary Management Strategies: Activity, Adequate rest, Exercise  Integumentary Integumentary Symptoms Reported: Bruising Additional Integumentary Details: dark blue dots on a yellow background; cyst on finger Skin Management Strategies: Exercise, Routine screening Skin Self-Management Outcome: 3 (uncertain)  Musculoskeletal Musculoskelatal Symptoms Reviewed: No symptoms reported        Psychosocial Psychosocial Symptoms Reported: No symptoms reported Behavioral Health Self-Management Outcome: 4 (good)   Quality of Family Relationships: helpful, supportive Do you feel physically threatened by others?: No      03/29/2024   10:58 AM  Depression screen PHQ 2/9  Decreased Interest 0  Down, Depressed, Hopeless 0  PHQ - 2 Score 0    There were no vitals filed for this visit.  Medications Reviewed Today   Medications were not reviewed in this encounter     Recommendation:   PCP Follow-up Specialty provider follow-up 03/15/24 GI 03/22/24 cardiology Continue Current Plan of Care  Follow Up Plan:   Telephone follow up appointment date/time:  03/28/24 3:45 pm   Suzen L. Ramonita, RN, BSN, CCM [place]  [facility], [facility] Health RN Care Manager Direct Dial: [phone]  Fax: [phone]

## 2024-04-26 ENCOUNTER — Ambulatory Visit (HOSPITAL_COMMUNITY)
Admission: RE | Admit: 2024-04-26 | Discharge: 2024-04-26 | Disposition: A | Discharge status: Home / Self Care | Source: Ambulatory Visit | Attending: Nurse Practitioner | Admitting: Nurse Practitioner

## 2024-04-26 ENCOUNTER — Encounter (HOSPITAL_COMMUNITY): Payer: Self-pay

## 2024-04-26 DIAGNOSIS — Z1231 Encounter for screening mammogram for malignant neoplasm of breast: Secondary | ICD-10-CM | POA: Diagnosis not present

## 2024-04-26 DIAGNOSIS — Z780 Asymptomatic menopausal state: Secondary | ICD-10-CM | POA: Insufficient documentation

## 2024-04-26 DIAGNOSIS — M85832 Other specified disorders of bone density and structure, left forearm: Secondary | ICD-10-CM | POA: Diagnosis not present

## 2024-04-26 DIAGNOSIS — Z13820 Encounter for screening for osteoporosis: Secondary | ICD-10-CM | POA: Diagnosis not present

## 2024-05-12 DIAGNOSIS — I10 Essential (primary) hypertension: Secondary | ICD-10-CM | POA: Diagnosis not present

## 2024-05-12 DIAGNOSIS — E1165 Type 2 diabetes mellitus with hyperglycemia: Secondary | ICD-10-CM | POA: Diagnosis not present

## 2024-05-18 DIAGNOSIS — I130 Hypertensive heart and chronic kidney disease with heart failure and stage 1 through stage 4 chronic kidney disease, or unspecified chronic kidney disease: Secondary | ICD-10-CM | POA: Diagnosis not present

## 2024-05-18 DIAGNOSIS — R800 Isolated proteinuria: Secondary | ICD-10-CM | POA: Diagnosis not present

## 2024-05-18 DIAGNOSIS — E1121 Type 2 diabetes mellitus with diabetic nephropathy: Secondary | ICD-10-CM | POA: Diagnosis not present

## 2024-05-18 DIAGNOSIS — E1122 Type 2 diabetes mellitus with diabetic chronic kidney disease: Secondary | ICD-10-CM | POA: Diagnosis not present

## 2024-05-18 DIAGNOSIS — I5032 Chronic diastolic (congestive) heart failure: Secondary | ICD-10-CM | POA: Diagnosis not present

## 2024-05-18 DIAGNOSIS — R600 Localized edema: Secondary | ICD-10-CM | POA: Diagnosis not present

## 2024-05-18 DIAGNOSIS — N182 Chronic kidney disease, stage 2 (mild): Secondary | ICD-10-CM | POA: Diagnosis not present

## 2024-05-18 DIAGNOSIS — E782 Mixed hyperlipidemia: Secondary | ICD-10-CM | POA: Diagnosis not present

## 2024-05-18 DIAGNOSIS — E871 Hypo-osmolality and hyponatremia: Secondary | ICD-10-CM | POA: Diagnosis not present

## 2024-05-18 DIAGNOSIS — I509 Heart failure, unspecified: Secondary | ICD-10-CM | POA: Diagnosis not present

## 2024-05-18 DIAGNOSIS — R001 Bradycardia, unspecified: Secondary | ICD-10-CM | POA: Diagnosis not present

## 2024-05-18 DIAGNOSIS — I129 Hypertensive chronic kidney disease with stage 1 through stage 4 chronic kidney disease, or unspecified chronic kidney disease: Secondary | ICD-10-CM | POA: Diagnosis not present

## 2024-05-23 ENCOUNTER — Other Ambulatory Visit: Payer: Self-pay | Admitting: *Deleted

## 2024-05-23 NOTE — Patient Outreach (Signed)
 Complex Care Management   Visit Note  07/20/2024  Name:  Ebony Stevens MRN: [id number] DOB: September 13, 1951  Situation: Referral received for Complex Care Management related to Diabetes with Complications I obtained verbal consent from Patient.  Visit completed with [name]  on the phone   status post Left index finger mucous cyst excision. Doing well on 03/23/24  Loss 120 lbs goal weight is 150 lbs    Background:   Past Medical History:  Diagnosis Date   Asthma    Chest pain 07/11/2015   Diabetes mellitus without complication (HCC)    Dyspnea 07/11/2015   Early cirrhosis (HCC) 2020   Excessive bleeding in the premenopausal period    Fibromyalgia    GERD (gastroesophageal reflux disease)    HTN (hypertension)    Hypertension    Localized edema    Mixed hyperlipidemia    PONV (postoperative nausea and vomiting)    Sleep apnea    Type 2 diabetes mellitus (HCC)     Assessment: Patient Reported Symptoms:  Cognitive        Neurological      HEENT        Cardiovascular      Respiratory      Endocrine   Endocrine Comment: Last A1C 5.2 off metformin  Gastrointestinal        Genitourinary      Integumentary Integumentary Symptoms Reported: Other Other Integumentary Symptoms: status post Left index finger mucous cyst excision. Doing well.  Date of surgery: 03/23/24 Skin Self-Management Outcome: 4 (good)  Musculoskeletal          Psychosocial              03/29/2024   10:58 AM  Depression screen PHQ 2/9  Decreased Interest 0  Down, Depressed, Hopeless 0  PHQ - 2 Score 0    There were no vitals filed for this visit.  Medications Reviewed Today   Medications were not reviewed in this encounter     Recommendation:   PCP Follow-up Continue Current Plan of Care  Follow Up Plan:   Telephone follow up appointment date/time:  pending   Ruthellen Tippy L. Ramonita, RN, BSN, CCM [place]  [facility], [facility] Health RN Care  Manager Direct Dial: [phone]  Fax: [phone]

## 2024-05-23 NOTE — Patient Instructions (Signed)
 Visit Information  Thank you for taking time to visit with me today. Please don't hesitate to contact me if I can be of assistance to you before our next scheduled appointment.  Your next care management appointment is by telephone on   pending  Please call the care guide team at [phone] if you need to cancel, schedule, or reschedule an appointment.   Please call the Suicide and Crisis Lifeline: 988 call the [place]  National Suicide Prevention Lifeline: 469 841 8243 or TTY: 780-733-9679 TTY 9808592136) to talk to a trained counselor call 1-800-273-TALK (toll free, 24 hour hotline) call the [facility]: [phone] call 911 if you are experiencing a Mental Health or Behavioral Health Crisis or need someone to talk to.  Breindy Meadow L. Ramonita, RN, BSN, CCM [place]  [facility], [facility] Health RN Care Manager Direct Dial: [phone]  Fax: [phone]

## 2024-05-29 ENCOUNTER — Encounter: Payer: Self-pay | Admitting: Gastroenterology

## 2024-05-30 ENCOUNTER — Other Ambulatory Visit: Payer: Self-pay | Admitting: Physician Assistant

## 2024-05-30 ENCOUNTER — Ambulatory Visit: Payer: Self-pay | Admitting: Internal Medicine

## 2024-05-30 ENCOUNTER — Ambulatory Visit (HOSPITAL_COMMUNITY)
Admission: RE | Admit: 2024-05-30 | Discharge: 2024-05-30 | Disposition: A | Discharge status: Home / Self Care | Source: Ambulatory Visit | Attending: Internal Medicine | Admitting: Internal Medicine

## 2024-05-30 ENCOUNTER — Ambulatory Visit (HOSPITAL_BASED_OUTPATIENT_CLINIC_OR_DEPARTMENT_OTHER)
Admission: RE | Admit: 2024-05-30 | Discharge: 2024-05-30 | Disposition: A | Discharge status: Home / Self Care | Source: Ambulatory Visit | Attending: Internal Medicine | Admitting: Internal Medicine

## 2024-05-30 DIAGNOSIS — R0609 Other forms of dyspnea: Secondary | ICD-10-CM | POA: Insufficient documentation

## 2024-05-30 DIAGNOSIS — R079 Chest pain, unspecified: Secondary | ICD-10-CM | POA: Diagnosis not present

## 2024-05-30 DIAGNOSIS — R9439 Abnormal result of other cardiovascular function study: Secondary | ICD-10-CM

## 2024-05-30 DIAGNOSIS — Z01810 Encounter for preprocedural cardiovascular examination: Secondary | ICD-10-CM

## 2024-05-30 LAB — NM MYOCAR MULTI W/SPECT W/WALL MOTION / EF
Base ST Depression (mm): 0 mm
LV dias vol: 106 mL (ref 46–106)
LV sys vol: 36 mL (ref 3.8–5.2)
MPHR: 149 {beats}/min
Nuc Stress EF: 66 %
Peak HR: 84 {beats}/min
Percent HR: 56 %
RATE: 0.2
Rest HR: 47 {beats}/min
Rest Nuclear Isotope Dose: 10.6 mCi
SDS: 5
SRS: 1
SSS: 6
ST Depression (mm): 0 mm
Stress Nuclear Isotope Dose: 32.5 mCi
TID: 1.17

## 2024-05-30 MED ORDER — TECHNETIUM TC 99M TETROFOSMIN IV KIT
32.5000 | PACK | Freq: Once | INTRAVENOUS | Status: AC | PRN
  Administered 2024-05-30: 32.5 via INTRAVENOUS

## 2024-05-30 MED ORDER — TECHNETIUM TC 99M TETROFOSMIN IV KIT
10.6000 | PACK | Freq: Once | INTRAVENOUS | Status: AC | PRN
  Administered 2024-05-30: 10.6 via INTRAVENOUS

## 2024-05-30 MED ORDER — REGADENOSON 0.4 MG/5ML IV SOLN
INTRAVENOUS | Status: AC
  Administered 2024-05-30: 0.4 mg via INTRAVENOUS
  Filled 2024-05-30: qty 5

## 2024-05-30 MED ORDER — SODIUM CHLORIDE FLUSH 0.9 % IV SOLN
INTRAVENOUS | Status: AC
Start: 2024-05-30 — End: 2024-05-30
  Administered 2024-05-30: 10 mL via INTRAVENOUS
  Filled 2024-05-30: qty 10

## 2024-05-30 NOTE — Progress Notes (Signed)
     Ebony Stevens presented for a Lexiscan  nuclear stress test today.  I Lorette CINDERELLA Kapur, PA-C, provided direct supervision and was present during the stress portion of the study today, which was completed without significant symptoms, immediate complications, or acute ST/T changes on ECG.  Stress imaging is pending at this time.  Preliminary ECG findings may be listed in the chart, but the stress test result will not be finalized until perfusion imaging is complete.  Lorette CINDERELLA Kapur, PA-C  05/30/2024, 10:47 AM

## 2024-06-02 MED ORDER — METOPROLOL TARTRATE 100 MG PO TABS: 100.0000 mg | ORAL_TABLET | Freq: Once | ORAL | 0 refills | Status: DC

## 2024-06-02 NOTE — Telephone Encounter (Signed)
-----   Message from Vishnu P Mallipeddi sent at 05/30/2024  3:53 PM EDT ----- Stress test is abnormal. Ischemia versus false alarm. Start metoprolol  tartarate 25 mg BID. Hold for BP<100 mm Hg SBP, HR<60 bpm. Obtain CT cardiac and f/u 2 months. ----- Message ----- From: Stacia Diannah SQUIBB, MD Sent: 05/30/2024   2:47 PM EDT To: Vishnu P Mallipeddi, MD

## 2024-06-02 NOTE — Telephone Encounter (Signed)
 The patient has been notified of the result and verbalized understanding.  All questions (if any) were answered. Advise patient will send instructions in the mail and routed additional information.  Littie CHRISTELLA Croak, CMA 06/02/2024 10:10 AM

## 2024-06-08 ENCOUNTER — Telehealth (HOSPITAL_COMMUNITY): Payer: Self-pay | Admitting: *Deleted

## 2024-06-08 NOTE — Telephone Encounter (Signed)
 Reaching out to patient to offer assistance regarding upcoming cardiac imaging study; pt verbalizes understanding of appt date/time, parking situation and where to check in, pre-test NPO status and medications ordered, and verified current allergies; name and call back number provided for further questions should they arise Sid Seats RN Navigator Cardiac Imaging [facility] [phone] office [phone] cell

## 2024-06-09 ENCOUNTER — Ambulatory Visit: Payer: Self-pay | Admitting: Internal Medicine

## 2024-06-09 ENCOUNTER — Ambulatory Visit (HOSPITAL_COMMUNITY)
Admission: RE | Admit: 2024-06-09 | Discharge: 2024-06-09 | Disposition: A | Discharge status: Home / Self Care | Source: Ambulatory Visit | Attending: Internal Medicine | Admitting: Internal Medicine

## 2024-06-09 DIAGNOSIS — I2510 Atherosclerotic heart disease of native coronary artery without angina pectoris: Secondary | ICD-10-CM | POA: Insufficient documentation

## 2024-06-09 DIAGNOSIS — R9439 Abnormal result of other cardiovascular function study: Secondary | ICD-10-CM | POA: Diagnosis not present

## 2024-06-09 LAB — POCT I-STAT CREATININE: Creatinine, Ser: 1.1 mg/dL — ABNORMAL HIGH (ref 0.44–1.00)

## 2024-06-09 MED ORDER — NITROGLYCERIN 0.4 MG SL SUBL
0.8000 mg | SUBLINGUAL_TABLET | Freq: Once | SUBLINGUAL | Status: AC
  Administered 2024-06-09: 0.8 mg via SUBLINGUAL

## 2024-06-09 MED ORDER — IOHEXOL 350 MG/ML SOLN
75.0000 mL | Freq: Once | INTRAVENOUS | Status: AC | PRN
Start: 2024-06-09 — End: 2024-06-09
  Administered 2024-06-09: 75 mL via INTRAVENOUS

## 2024-06-13 DIAGNOSIS — Z01810 Encounter for preprocedural cardiovascular examination: Secondary | ICD-10-CM | POA: Diagnosis not present

## 2024-06-14 LAB — BASIC METABOLIC PANEL WITH GFR
BUN/Creatinine Ratio: 12 (ref 12–28)
BUN: 14 mg/dL (ref 8–27)
CO2: 21 mmol/L (ref 20–29)
Calcium: 9.5 mg/dL (ref 8.7–10.3)
Chloride: 98 mmol/L (ref 96–106)
Creatinine, Ser: 1.14 mg/dL — ABNORMAL HIGH (ref 0.57–1.00)
Glucose: 102 mg/dL — ABNORMAL HIGH (ref 70–99)
Potassium: 4.6 mmol/L (ref 3.5–5.2)
Sodium: 137 mmol/L (ref 134–144)
eGFR: 51 mL/min/1.73 — ABNORMAL LOW (ref >59)

## 2024-06-15 ENCOUNTER — Ambulatory Visit: Payer: Self-pay | Admitting: Internal Medicine

## 2024-06-16 NOTE — Telephone Encounter (Signed)
 Patient notified of result.  Please refer to phone note from today for complete details.   Littie CHRISTELLA Croak, CMA 06/16/2024 3:50 PM

## 2024-06-16 NOTE — Telephone Encounter (Signed)
-----   Message from Vishnu P Mallipeddi sent at 06/15/2024  4:16 PM EDT ----- Coronary calcium  score is 21, 50th percentile for age and sex matched control.  Mild nonobstructive CAD is present.  Chest pain and DOE unlikely from CAD. ----- Message ----- From: Interface, Rad Results In Sent: 06/11/2024   3:45 PM EDT To: Vishnu P Mallipeddi, MD

## 2024-06-19 ENCOUNTER — Ambulatory Visit (HOSPITAL_COMMUNITY)
Admission: RE | Admit: 2024-06-19 | Discharge: 2024-06-19 | Disposition: A | Discharge status: Home / Self Care | Source: Ambulatory Visit | Attending: Urology | Admitting: Urology

## 2024-06-19 DIAGNOSIS — N2889 Other specified disorders of kidney and ureter: Secondary | ICD-10-CM | POA: Insufficient documentation

## 2024-06-19 DIAGNOSIS — N1330 Unspecified hydronephrosis: Secondary | ICD-10-CM | POA: Diagnosis not present

## 2024-06-21 DIAGNOSIS — R42 Dizziness and giddiness: Secondary | ICD-10-CM

## 2024-06-22 ENCOUNTER — Ambulatory Visit: Payer: Self-pay | Admitting: Internal Medicine

## 2024-06-22 DIAGNOSIS — I4589 Other specified conduction disorders: Secondary | ICD-10-CM

## 2024-06-26 NOTE — Telephone Encounter (Signed)
 The patient has been notified of the result and verbalized understanding.  All questions (if any) were answered. Patient is aware that she will be called for an appointment  Littie CHRISTELLA Croak, CMA 06/26/2024 4:33 PM

## 2024-06-26 NOTE — Telephone Encounter (Signed)
-----   Message from Vishnu P Mallipeddi sent at 06/22/2024  2:44 PM EDT ----- Average HR 49 bpm, symptomatic with exertional dizziness during recent office visit. Place EP referral for chronotropic incompetence and PPM eval. She also has 9.3% PAC burden and 40 runs of  non-sustained SVT, although symptomatic, cannot start BB/CCB due to baseline bradycardia. ----- Message ----- From: Stacia Diannah SQUIBB, MD Sent: 06/21/2024  10:45 AM EDT To: Vishnu P Mallipeddi, MD

## 2024-06-28 ENCOUNTER — Ambulatory Visit (INDEPENDENT_AMBULATORY_CARE_PROVIDER_SITE_OTHER): Admitting: Urology

## 2024-06-28 VITALS — BP 154/57 | HR 43

## 2024-06-28 DIAGNOSIS — N2889 Other specified disorders of kidney and ureter: Secondary | ICD-10-CM

## 2024-06-28 LAB — URINALYSIS, ROUTINE W REFLEX MICROSCOPIC
Bilirubin, UA: NEGATIVE
Glucose, UA: NEGATIVE
Ketones, UA: NEGATIVE
Leukocytes,UA: NEGATIVE
Nitrite, UA: NEGATIVE
Protein,UA: NEGATIVE
RBC, UA: NEGATIVE
Specific Gravity, UA: <1.005 — ABNORMAL LOW (ref 1.005–1.030)
Urobilinogen, Ur: 0.2 mg/dL (ref 0.2–1.0)
pH, UA: 6 (ref 5.0–7.5)

## 2024-06-28 NOTE — Progress Notes (Signed)
 06/28/2024 2:47 PM   [name] [birth year] [id number]  Referring provider: Shona Norleen PEDLAR, MD [address] [place],  [place] [address]  Right renal mass   HPI: Ms Spanier is a 73yo here for followup for a right renal AML. Renal US  shows decrease in mas to 3.0 cm from 3.5cm after embolization. She denies any flank pain. She denies any hematuria. No significant LUTS. No other complaints today   PMH: Past Medical History:  Diagnosis Date   Asthma    Chest pain 07/11/2015   Diabetes mellitus without complication (HCC)    Dyspnea 07/11/2015   Early cirrhosis (HCC) 2020   Excessive bleeding in the premenopausal period    Fibromyalgia    GERD (gastroesophageal reflux disease)    HTN (hypertension)    Hypertension    Localized edema    Mixed hyperlipidemia    PONV (postoperative nausea and vomiting)    Sleep apnea    Type 2 diabetes mellitus (HCC)     Surgical History: Past Surgical History:  Procedure Laterality Date   ABDOMINAL HYSTERECTOMY     1975 partial hysterectomy,  1978 complete hysterctomy and retack bladder. states it didn't take.    BIOPSY  11/10/2018   Procedure: BIOPSY;  Surgeon: Shaaron Lamar HERO, MD;  [facility];  Service: Endoscopy;;  colon    CHOLECYSTECTOMY     COLONOSCOPY WITH PROPOFOL  N/A 11/10/2018   Surgeon: Shaaron Lamar HERO, MD;  1 hyperplastic polyp, random colon biopsies benign, due for repeat in 2025 due to family history of colon cancer.   DILATION AND CURETTAGE OF UTERUS     ESOPHAGOGASTRODUODENOSCOPY (EGD) WITH PROPOFOL  N/A 08/21/2021   Surgeon: Shaaron Lamar HERO, MD; Mild Schatzki's ring dilated, small hiatal hernia, no portal gastropathy, normal examined duodenum.   IR EMBO TUMOR ORGAN ISCHEMIA INFARCT INC GUIDE ROADMAPPING  03/08/2024   IR RENAL SUPRASEL UNI S&I MOD SED  03/08/2024   IR US  GUIDE VASC ACCESS RIGHT  03/08/2024   MALONEY DILATION  08/21/2021   Procedure: MALONEY DILATION;  Surgeon: Shaaron Lamar HERO, MD;   [facility]: [facility];  Service: Endoscopy;;   POLYPECTOMY  11/10/2018   Procedure: POLYPECTOMY;  Surgeon: Shaaron Lamar HERO, MD;  [facility]: [facility];  Service: Endoscopy;;  colon    TONSILLECTOMY      Home Medications:  Allergies as of 06/28/2024   No Known Allergies      Medication List        Accurate as of June 28, 2024  2:47 PM. If you have any questions, ask your nurse or doctor.          acetaminophen  500 MG tablet Commonly known as: TYLENOL  Take 500 mg by mouth every 6 (six) hours as needed for moderate pain.   aspirin  EC 325 MG tablet Take 325 mg by mouth daily.   aspirin  325 MG tablet Take 325 mg by mouth.   atorvastatin  40 MG tablet Commonly known as: LIPITOR Take 40 mg by mouth daily.   CALTRATE 600+D PO Take 1 tablet by mouth daily.   cetirizine 10 MG tablet Commonly known as: ZYRTEC Take 10 mg by mouth daily.   cetirizine 10 MG tablet Commonly known as: ZYRTEC Take 10 mg by mouth.   estradiol 1 MG tablet Commonly known as: ESTRACE Take 1 tablet by mouth at bedtime.   furosemide  40 MG tablet Commonly known as: LASIX  TAKE ONE TABLET BY MOUTH EVERY OTHER DAY What changed: when to  take this   loperamide 2 MG tablet Commonly known as: IMODIUM A-D Take 2 mg by mouth as needed for diarrhea or loose stools.   metFORMIN 500 MG 24 hr tablet Commonly known as: GLUCOPHAGE-XR Take 500 mg by mouth daily.   metoprolol  tartrate 100 MG tablet Commonly known as: LOPRESSOR  Take 1 tablet (100 mg total) by mouth once for 1 dose. 2 hours prior to procedure   Mounjaro 5 MG/0.5ML Pen Generic drug: tirzepatide Inject 5 mg into the skin once a week.   pantoprazole  40 MG tablet Commonly known as: PROTONIX  Take 40 mg by mouth.   pantoprazole  40 MG tablet Commonly known as: PROTONIX  Take 1 tablet (40 mg total) by mouth daily.   sodium chloride  1 g tablet Take 1 g by mouth 3 (three) times daily with meals.   sodium chloride  1 g tablet Take  1 g by mouth.   spironolactone  50 MG tablet Commonly known as: ALDACTONE  Take 50 mg by mouth.   spironolactone  50 MG tablet Commonly known as: Aldactone  Take 1 tablet (50 mg total) by mouth daily.        Allergies: No Known Allergies  Family History: Family History  Problem Relation Age of Onset   High blood pressure Mother    Cancer Sister        ovarian   Diabetes Brother    Colon cancer Brother 22       Passed from [facility] after 1 year   Heart Problems Maternal Grandmother     Social History:  reports that she has never smoked. She has never used smokeless tobacco. She reports that she does not drink alcohol and does not use drugs.  ROS: All other review of systems were reviewed and are negative except what is noted above in HPI  Physical Exam: BP (!) 154/57   Pulse (!) 43   Constitutional:  Alert and oriented, No acute distress. HEENT: [place] AT, moist mucus membranes.  Trachea midline, no masses. Cardiovascular: No clubbing, cyanosis, or edema. Respiratory: Normal respiratory effort, no increased work of breathing. GI: Abdomen is soft, nontender, nondistended, no abdominal masses GU: No CVA tenderness.  Lymph: No cervical or inguinal lymphadenopathy. Skin: No rashes, bruises or suspicious lesions. Neurologic: Grossly intact, no focal deficits, moving all 4 extremities. Psychiatric: Normal mood and affect.  Laboratory Data: Lab Results  Component Value Date   WBC 5.3 03/08/2024   HGB 13.1 03/08/2024   HCT 38.6 03/08/2024   MCV 91.9 03/08/2024   PLT 153 03/08/2024    Lab Results  Component Value Date   CREATININE 1.14 (H) 06/13/2024    No results found for: PSA  No results found for: TESTOSTERONE  Lab Results  Component Value Date   HGBA1C 5.9 (H) 07/15/2023    Urinalysis    Component Value Date/Time   COLORURINE STRAW (A) 07/15/2023 1956   APPEARANCEUR Clear 12/15/2023 1351   LABSPEC 1.004 (L) 07/15/2023 1956   PHURINE 5.0 07/15/2023 1956    GLUCOSEU Negative 12/15/2023 1351   HGBUR NEGATIVE 07/15/2023 1956   BILIRUBINUR Negative 12/15/2023 1351   KETONESUR NEGATIVE 07/15/2023 1956   PROTEINUR Negative 12/15/2023 1351   PROTEINUR NEGATIVE 07/15/2023 1956   NITRITE Negative 12/15/2023 1351   NITRITE NEGATIVE 07/15/2023 1956   LEUKOCYTESUR Negative 12/15/2023 1351   LEUKOCYTESUR NEGATIVE 07/15/2023 1956    Lab Results  Component Value Date   LABMICR Comment 12/15/2023    Pertinent Imaging: Renal US  06/19/24: Images reviewed and discussed with the patient  No results found for this or any previous visit.  No results found for this or any previous visit.  No results found for this or any previous visit.  No results found for this or any previous visit.  Results for orders placed during the hospital encounter of 06/19/24  Ultrasound renal complete  Narrative CLINICAL DATA:  Follow-up examination for right renal mass.  EXAM: RENAL / URINARY TRACT ULTRASOUND COMPLETE  COMPARISON:  Prior MRI from 11/12/2023 and ultrasound from 08/31/2023.  FINDINGS: Right Kidney:  Renal measurements: 9.8 x 4.2 x 5.6 cm = volume: 118.6 mL. Renal echogenicity within normal limits. No nephrolithiasis or hydronephrosis. Small extrarenal pelvis noted. Echogenic mass positioned at the upper pole measures 3.0 x 3.2 x 2.0 cm, similar to prior MRI (previously measuring up to 3.4 cm).  Left Kidney:  Renal measurements: 10.6 x 4.8 x 4.8 cm = volume: 128.0 mL. Renal echogenicity within normal limits. No nephrolithiasis. Minimal pelviectasis without hydronephrosis. No focal renal mass.  Bladder:  Appears normal for degree of bladder distention.  Other:  None.  IMPRESSION: 1. 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 This is relatively stable as compared to prior MRI from 11/12/2023. 2. Otherwise unremarkable renal ultrasound.   Electronically Signed By: Morene Hoard M.D. On: 06/22/2024 03:09  No results found for this or any previous visit.  No results found for this or any previous visit.  No results found for this or any previous visit.   Assessment & Plan:    1. Right renal mass (Primary) Followup 6 months with renal US  - Urinalysis, Routine w reflex microscopic   No follow-ups on file.  Belvie Clara, MD  [facility] [place]

## 2024-07-04 ENCOUNTER — Encounter: Payer: Self-pay | Admitting: Urology

## 2024-07-04 NOTE — Patient Instructions (Signed)
 A Growth in the Kidney (Renal Mass): What to Know  A renal mass is an abnormal growth in the kidney. It may be found during an MRI, CT scan, or ultrasound that's done to check for other problems in the belly. Some renal masses are cancerous, or malignant, and can grow or spread quickly. Others are benign, which means they're not cancer. Renal masses include: Tumors. These may be either cancerous or benign. The most common cancerous tumor in adults is called renal cell carcinoma. In children, the most common type is Wilms tumor. The most common kidney tumors that aren't cancer include renal adenomas, oncocytomas, and angiomyolipomas (AML). Cysts. These are pockets of fluid that form on or in the kidney. What are the causes? Certain types of cancers, infections, or injuries can cause a renal mass. It's not always known what causes a cyst to form in or on the kidney. What are the signs or symptoms? Often, a renal mass or kidney cyst doesn't cause any signs or symptoms. How is this diagnosed? Your health care provider may suggest tests to diagnose the cause of your renal mass. These tests may include: A physical exam. Blood tests. Pee (urine) tests. Imaging tests, such as: CT scan. MRI. Ultrasound. Chest X-ray or bone scan. These may be done if a tumor is cancerous to see if the cancer has spread outside the kidney. Biopsy. This is when a small piece of tissue is removed from the renal mass for testing. How is this treated? Treatment is not always needed for a renal mass. Treatment will depend on the cause of the mass and if it's causing any problems or symptoms. For a cancerous renal mass, treatment options may include: Surgery. This is done to remove the tumor and any affected tissue. Chemotherapy. This uses medicines to kill cancer cells. Radiation. High-energy X-rays or gamma rays are used to kill cancer cells. Ablation. This uses extreme hot or cold temperature to kill the cancer  cells. Immunotherapy. Medicines are used to help the body's defense system (immune system) fight the cancer cells. Taking part in clinical trials. This involves trying new or experimental treatments to see if they're effective. Most kidney cysts don't need to be treated. Follow these instructions at home: What you need to do at home will depend on the cause of the mass. Follow the instructions that your provider gives you. In general: Take medicines only as told. If you were given antibiotics, take them as told. Do not stop taking them even if you start to feel better. Follow any instructions from your provider about what you can and can't do. Do not smoke, vape, or use nicotine or tobacco. Keep all follow-up visits. Your provider will need to check if your renal mass has changed or grown. Contact a health care provider if: You have flank pain, which is pain in your side or back. You have a fever. You have a loss of appetite. You have pain or swelling in your belly. You lose weight. Get help right away if: Your pain gets worse. There's blood in your pee. You can't pee. This information is not intended to replace advice given to you by your health care provider. Make sure you discuss any questions you have with your health care provider. Document Revised: 03/26/2023 Document Reviewed: 03/26/2023 Elsevier Patient Education  2025 [employer].

## 2024-07-06 ENCOUNTER — Other Ambulatory Visit: Payer: Self-pay | Admitting: Interventional Radiology

## 2024-07-06 DIAGNOSIS — N2889 Other specified disorders of kidney and ureter: Secondary | ICD-10-CM

## 2024-07-06 DIAGNOSIS — C755 Malignant neoplasm of aortic body and other paraganglia: Secondary | ICD-10-CM | POA: Diagnosis not present

## 2024-07-24 ENCOUNTER — Encounter: Payer: Self-pay | Admitting: Gastroenterology

## 2024-07-24 ENCOUNTER — Ambulatory Visit: Admitting: Gastroenterology

## 2024-07-24 VITALS — BP 135/70 | HR 55 | Temp 98.3°F | Ht 62.0 in | Wt 163.8 lb

## 2024-07-24 DIAGNOSIS — K7460 Unspecified cirrhosis of liver: Secondary | ICD-10-CM

## 2024-07-24 DIAGNOSIS — N2889 Other specified disorders of kidney and ureter: Secondary | ICD-10-CM

## 2024-07-24 DIAGNOSIS — K219 Gastro-esophageal reflux disease without esophagitis: Secondary | ICD-10-CM

## 2024-07-24 DIAGNOSIS — Z860102 Personal history of hyperplastic colon polyps: Secondary | ICD-10-CM

## 2024-07-24 DIAGNOSIS — K5900 Constipation, unspecified: Secondary | ICD-10-CM

## 2024-07-24 DIAGNOSIS — K5903 Drug induced constipation: Secondary | ICD-10-CM

## 2024-07-24 NOTE — Patient Instructions (Addendum)
 We will do your liver labs in December and mail you the lab slips.  We will get you scheduled for your ultrasound after January per your request while working on getting insurance situated.  Cirrhosis Lifestyle Recommendations:  High-protein diet from a primarily plant-based diet. Avoid red meat.  No raw or undercooked meat, seafood, or shellfish. Low-fat/cholesterol/carbohydrate diet. Limit sodium to no more than 2000 mg/day including everything that you eat and drink. Recommend at least 30 minutes of aerobic and resistance exercise 3 days/week. Limit Tylenol  to 2000 mg daily.   Follow a GERD diet:  Avoid fried, fatty, greasy, spicy, citrus foods. Avoid caffeine and carbonated beverages. Avoid chocolate. Try eating 4-6 small meals a day rather than 3 large meals. Do not eat within 3 hours of laying down. Prop head of bed up on wood or bricks to create a 6 inch incline.  Continue taking your pantoprazole  40 mg once daily. Continue your spironolactone  50 mg daily and your Lasix  40 mg once every other day, or daily for a few days if needed.  Continue to follow-up with cardiology in regards to your bradycardia for consideration of a pacemaker if warranted.  Follow-up in the office in 6 months.  We will send you a triage questionnaire in January given you are due for your colonoscopy.  It was a pleasure to see you today. I want to create trusting relationships with patients. If you receive a survey regarding your visit,  I greatly appreciate you taking time to fill this out on paper or through your MyChart. I value your feedback.  Charmaine Melia, MSN, FNP-BC, AGACNP-BC [facility]

## 2024-07-24 NOTE — Progress Notes (Signed)
 GI Office Note    Referring Provider: Shona Norleen PEDLAR, MD Primary Care Physician:  Shona Norleen PEDLAR, MD Primary Gastroenterologist: Lamar HERO.Rourk, MD  Date:  07/24/2024  ID:  Ebony Stevens, DOB [birth year], MRN [id number]   Chief Complaint   Chief Complaint  Patient presents with   Follow-up    Follow up. No problems    History of Present Illness  Ebony Stevens is a 73 y.o. 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  CT in May 2019: -hepatic steatosis, hepatomegaly, and area of chronic volume loss in the left lobe of the liver with new calcification and pneumobilia likely due to evolution of remote insult related to cholecystectomy.  AFP normal in the past.  Chronic diarrhea previously managed with Questran  and antidiarrheals.  Has had benign random colon biopsies and celiac screening.   Colonoscopy in 2020 with 1 hyperplastic polyp, benign random colon biopsies.  Due to repeat in 2025 due to family history of colon cancer.   Ultrasound September 2022 with fatty liver, superimposed inflammation or fibrosis not excluded, patent portal vein, evidence of cholecystectomy, 4 cm right renal angiomyolipoma.    EGD 08/21/2021: Not Schatzki's ring s/p dilation, small to hernia, no portal gastropathy, normal duodenum.  Recommend starting on Protonix  40 mg daily.   OV 02/04/23. Dysphagia slightly improved since stretching.  Continues to have intermittent lower abdominal pain and alternating constipation with diarrhea, leaning more toward diarrhea.  Using Imodium as needed.  Not able to do a lot of physical therapy.  Reported a lot of nasal congestion Rehman allergies but no fevers or chills.  No overt abdominal pain but having back pain.  Having increased peripheral edema. Started on  spironolactone  to help with peripheral edema.  Continue Lasix .   RUQ US  with elastography 02/23/23: -CBD measuring 4 mm -Increased hepatic echogenicity -kPa 4.1   Last office visit 07/13/23.  GERD controlled as long as she follows diet.  Taking Imodium 2-4 every other day.  Has abdominal pain if she takes it every day.  Trying to stay eating her fiber.,  Occasionally no bowel movement at all but this is normal rare occasion.  Having back pain.  Taking her diuretics and denied any significant ascites.  Labs ordered including FibroSure.  Advised ultrasound in November.  Reinforced 2 g sodium diet.  Continue diuretics and pantoprazole . MELD 7 in April 2024   Fibrosure Oct 2024 with F2 bridging fibrosis, moderate to severe steatosis, severe NASH.  Mildly elevated bedsides to 154.   RUQ US  08/31/23: -Increase hepatic echogenicity suggestive of steatosis -4.5 cm echogenic mass off superior pole of right kidney compatible with angiomyolipoma -Recommended urology referral, patient requested to wait until the beginning of the following year   MR abdomen with and without contrast 11/12/2023: Macroscopic fat-containing mass arising from lateral midportion right kidney consistent with benign renal angiomyolipoma, advised to consider surgical referral for further management and surveillance consideration given risk of spontaneous hemorrhage.  She has seen urology and opted for surveillance of mass.  She will have follow-up renal ultrasound in 6 months.  Shortly after office visit patient decided she wanted to proceed with surgery.  She was made another office visit to discuss renal ablation versus nephrectomy.  Her follow-up visit 3/21 she opted for embolization, referred to IR for this.  Last office visit 01/11/2024.  Denied any hematemesis, variceal bleeding, abdominal distention, fever, chills.  Had been on Mounjaro and having some issues with constipation.  Tried smooth move tea but not helping her.  Taking  Aldactone  and Lasix  50 mg Aldactone  and 40 mg Lasix  daily.  Last EGD in November 2022 without varices.  Had had a ultrasound in November 2024 without hepatoma.  Noted to be nonimmune to hepatitis A.  Had some low blood pressures morning of visit.  Following low-fat low sugar diet.  Has been not discharged to rehab from having recent sickness.  Reflux controlled with diet, only taking pantoprazole  as needed.  MELD labs and ultrasound ordered..  Advise Lasix  every other day and Aldactone  daily.  Given referral to cardiology given reports of angina.   Today:  Discussed the use of AI scribe software for clinical note transcription with the patient, who gave verbal consent to proceed.  MELD 3.0: 9 as of June 2025  US : Nov 2024 without hepatoma AFP: 5 in Oct 2024 Hep A/B vaccination: Not immune to hepatitis A. EGD:  2022 without gastropathy or varices.  Platelets remain > 150 BB: not currently. Ascites/peripheral edema: controlled with diuretics Diuretics: lasix  40 every other day and aldactone  50 mg daily.  Paracentesis: none History of SBP: none Encephalopathy:  none   She experiences chronic constipation, using Miralax daily and also taking 3 Dulcolax tablets daily. She also uses Smooth Move tea containing Senna, which she finds effective. Despite these measures, she sometimes goes two to three days without bowel movements and feels incomplete evacuation. Bisacodyl has been ineffective, and she occasionally uses enemas, which are difficult to administer. Stools hard and straining.   She has cirrhosis. Her liver function tests were stable in June. She has not experienced significant swelling in her abdomen, legs, or feet, and manages minor ankle swelling with spironolactone  daily and Lasix  every other day, occasionally taking daily if having increased swelling.  Denies jaundice or pruritus. No mental status changes.   She underwent kidney ablation, resulting in a reduction in tumor size at most  recent renal US  in September.   She is not currently on metoprolol  due to low heart rate and is considering a pacemaker. An appointment with a cardiologist has been postponed due to insurance concerns.  She takes pantoprazole  daily for reflux, especially since she enjoys spicy foods. She has not noticed any blood or black stool.  She is on Mounjaro for diabetes management, with her A1c recently reported at 5.3 or 5.4. Her blood sugars are stable, and she has lost weight since May, dropping from 185 pounds to 163 pounds.  She experiences occasional itching but no regular or severe symptoms. Her breathing has improved with weight loss, although she continues to use a CPAP machine. She has a history of breathing difficulties even when she was younger.  She has been taking sodium chloride  tablets and is no longer on any blood pressure medications.       Wt Readings from Last 5 Encounters:  07/24/24 163 lb 12.8 oz (74.3 kg)  03/28/24 179 lb (81.2 kg)  03/22/24 182 lb 9.6 oz (82.8 kg)  03/14/24 184 lb (83.5 kg)  03/08/24 185 lb (83.9 kg)    Current Outpatient Medications  Medication Sig Dispense Refill   acetaminophen  (TYLENOL ) 500 MG tablet Take 500 mg by mouth every 6 (six) hours as needed for moderate pain.     atorvastatin  (LIPITOR) 40 MG tablet Take 40 mg by mouth daily.  Calcium  Carbonate-Vitamin D (CALTRATE 600+D PO) Take 1 tablet by mouth daily.     cetirizine (ZYRTEC) 10 MG tablet Take 10 mg by mouth daily.     estradiol (ESTRACE) 1 MG tablet Take 1 tablet by mouth at bedtime.     furosemide  (LASIX ) 40 MG tablet TAKE ONE TABLET BY MOUTH EVERY OTHER DAY 15 tablet 2   MOUNJARO 5 MG/0.5ML Pen Inject 5 mg into the skin once a week.     pantoprazole  (PROTONIX ) 40 MG tablet Take 1 tablet (40 mg total) by mouth daily. 90 tablet 3   pantoprazole  (PROTONIX ) 40 MG tablet Take 40 mg by mouth.     sodium chloride  1 g tablet Take 1 g by mouth 3 (three) times daily with meals.      spironolactone  (ALDACTONE ) 50 MG tablet Take 1 tablet (50 mg total) by mouth daily. 30 tablet 11   aspirin  325 MG tablet Take 325 mg by mouth. (Patient not taking: Reported on 07/24/2024)     aspirin  EC 325 MG tablet Take 325 mg by mouth daily.     cetirizine (ZYRTEC) 10 MG tablet Take 10 mg by mouth.     loperamide (IMODIUM A-D) 2 MG tablet Take 2 mg by mouth as needed for diarrhea or loose stools.     metFORMIN (GLUCOPHAGE-XR) 500 MG 24 hr tablet Take 500 mg by mouth daily. (Patient not taking: Reported on 07/24/2024)     metoprolol  tartrate (LOPRESSOR ) 100 MG tablet Take 1 tablet (100 mg total) by mouth once for 1 dose. 2 hours prior to procedure (Patient not taking: Reported on 07/24/2024) 1 tablet 0   sodium chloride  1 g tablet Take 1 g by mouth.     spironolactone  (ALDACTONE ) 50 MG tablet Take 50 mg by mouth.     No current facility-administered medications for this visit.    Past Medical History:  Diagnosis Date   Asthma    Chest pain 07/11/2015   Diabetes mellitus without complication (HCC)    Dyspnea 07/11/2015   Early cirrhosis (HCC) 2020   Excessive bleeding in the premenopausal period    Fibromyalgia    GERD (gastroesophageal reflux disease)    HTN (hypertension)    Hypertension    Localized edema    Mixed hyperlipidemia    PONV (postoperative nausea and vomiting)    Sleep apnea    Type 2 diabetes mellitus (HCC)     Past Surgical History:  Procedure Laterality Date   ABDOMINAL HYSTERECTOMY     1975 partial hysterectomy,  1978 complete hysterctomy and retack bladder. states it didn't take.    BIOPSY  11/10/2018   Procedure: BIOPSY;  Surgeon: Shaaron Lamar HERO, MD;  [facility]: [facility];  Service: Endoscopy;;  colon    CHOLECYSTECTOMY     COLONOSCOPY WITH PROPOFOL  N/A 11/10/2018   Surgeon: Shaaron Lamar HERO, MD;  1 hyperplastic polyp, random colon biopsies benign, due for repeat in 2025 due to family history of colon cancer.   DILATION AND CURETTAGE OF UTERUS      ESOPHAGOGASTRODUODENOSCOPY (EGD) WITH PROPOFOL  N/A 08/21/2021   Surgeon: Shaaron Lamar HERO, MD; Mild Schatzki's ring dilated, small hiatal hernia, no portal gastropathy, normal examined duodenum.   IR EMBO TUMOR ORGAN ISCHEMIA INFARCT INC GUIDE ROADMAPPING  03/08/2024   IR RENAL SUPRASEL UNI S&I MOD SED  03/08/2024   IR US  GUIDE VASC ACCESS RIGHT  03/08/2024   MALONEY DILATION  08/21/2021   Procedure: MALONEY DILATION;  Surgeon: Shaaron Lamar HERO, MD;  [facility]: [facility];  Service: Endoscopy;;   POLYPECTOMY  11/10/2018   Procedure: POLYPECTOMY;  Surgeon: Shaaron Lamar HERO, MD;  [facility]: [facility];  Service: Endoscopy;;  colon    TONSILLECTOMY      Family History  Problem Relation Age of Onset   High blood pressure Mother    Cancer Sister        ovarian   Diabetes Brother    Colon cancer Brother 26       Passed from [facility] after 1 year   Heart Problems Maternal Grandmother     Allergies as of 07/24/2024   (No Known Allergies)    Social History   Socioeconomic History   Marital status: Married    Spouse name: Not on file   Number of children: Not on file   Years of education: Not on file   Highest education level: Not on file  Occupational History   Not on file  Tobacco Use   Smoking status: Never   Smokeless tobacco: Never  Vaping Use   Vaping status: Never Used  Substance and Sexual Activity   Alcohol use: No   Drug use: No   Sexual activity: Not Currently  Other Topics Concern   Not on file  Social History Narrative   ** Merged History Encounter **       Social Drivers of Health   Financial Resource Strain: Low Risk  (09/01/2023)   Overall Financial Resource Strain (CARDIA)    Difficulty of Paying Living Expenses: Not very hard  Food Insecurity: No Food Insecurity (01/17/2024)   Hunger Vital Sign    Worried About Running Out of Food in the Last Year: Never true    Ran Out of Food in the Last Year: Never true  Transportation Needs: No Transportation Needs  (01/17/2024)   PRAPARE - Administrator, Civil Service (Medical): No    Lack of Transportation (Non-Medical): No  Physical Activity: Insufficiently Active (01/17/2024)   Exercise Vital Sign    Days of Exercise per Week: 3 days    Minutes of Exercise per Session: 30 min  Stress: No Stress Concern Present (01/17/2024)   [employer] of Occupational Health - Occupational Stress Questionnaire    Feeling of Stress : Only a little  Social Connections: Moderately Isolated (01/04/2024)   Social Connection and Isolation Panel    Frequency of Communication with Friends and Family: Once a week    Frequency of Social Gatherings with Friends and Family: Once a week    Attends Religious Services: 1 to 4 times per year    Active Member of [employer] or Organizations: No    Attends Banker Meetings: Never    Marital Status: Married     Review of Systems   Gen: + weight loss. Denies fever, chills, anorexia. Denies fatigue, weakness CV: + intermittent chest pain and Doe. Denies palpitations and claudication. Resp: Denies dyspnea at rest, cough, wheezing, coughing up blood, and pleurisy. GI: See HPI Derm: Denies rash, itching, dry skin Psych: + anxiety. Denies depression, memory loss, confusion. No homicidal or suicidal ideation.  Heme: Denies bruising, bleeding, and enlarged lymph nodes. + hair loss.   Physical Exam   BP 135/70 (BP Location: Right Arm, Patient Position: Sitting, Cuff Size: Normal)   Pulse (!) 55   Temp 98.3 F (36.8 C) (Temporal)   Ht 5' 2 (1.575 m)   Wt 163 lb 12.8 oz (74.3 kg)   BMI 29.96 kg/m  General:   Alert and oriented. No distress noted. Pleasant and cooperative.  Head:  Normocephalic and atraumatic. Eyes:  Conjuctiva clear without scleral icterus. Mouth:  Oral mucosa pink and moist. Good dentition. No lesions. Lungs:  Clear to auscultation bilaterally. No wheezes, rales, or rhonchi. No distress.  Heart:  S1, S2 present without murmurs  appreciated.  Bradycardia. Abdomen:  +BS, soft, non-tender and non-distended. No rebound or guarding. No HSM or masses noted. Rectal: deferred Msk:  Symmetrical without gross deformities. Normal posture. Extremities:  Without edema. Neurologic:  Alert and  oriented x4 Psych:  Alert and cooperative. Normal mood and affect.  Assessment & Plan  KIP KAUTZMAN is a 73 y.o. female presenting today for cirrhosis follow up.    Cirrhosis of liver Cirrhosis of the liver is well-managed with stable liver function tests. MELD score 9 in June 2025. Due for hepatoma screening. No recent imaging to assess for hepatic lesions. No jaundice, pruritus, HE.  Maintained on lasix  and Aldactone  without ascites.  - Order liver ultrasound after the first of the year for hepatoma screening - Order MELD labs - CBC, CMP, INR, AFP for December 2025 - Instruct to report any new symptoms such as appetite changes, worsening constipation, itching, or jaundice - 2g sodium diet - Continue Aldactone  50 mg daily and Lasix  40 mg every other day without occasional daily dosing if needed.  Constipation Chronic constipation persists with ineffective bisacodyl. Current regimen includes Miralax, bisacodyl, and Smooth Move tea containing Senna. Miralax can be increased, and Senna is effective and palatable. Discussed Senna or Senna-Colace tablets as needed. Prescription options available if over-the-counter measures fail. - Increase Miralax to twice daily - Discontinue bisacodyl - Consider Senna or Senna-Colace combination tablets daily.  - Use Smooth Move tea as needed - Discuss prescription options in the future if over-the-counter measures are ineffective  Gastroesophageal reflux disease Gastroesophageal reflux disease is managed with daily pantoprazole . No hematochezia or melena reported. - Continue pantoprazole  40 mg once daily - Monitor for any changes in symptoms - GERD diet  Renal mass status post ablation Renal  mass is stable and possibly reduced in size post-ablation. No significant edema reported. Ablation involved devascularization of the mass. - Monitor for any changes or symptoms - Continue to follow with Urology.     History of colon polyps, family history of colon polyps Last colonoscopy in 2020 with 1 hyperplastic polyp removed and with random colon biopsies negative.  Was recommended a 5-year repeat.  Currently due however undergoing possible change in insurance and given her bills would like to hold off until after the first of the year for any other imaging or procedures.  Currently with baseline constipation, no alarm symptoms present. -Placed on recall for triage for colonoscopy January 2026   Follow-up   Follow up 6 months.  Given she is considering changing insurance, we will send recall to triage for colonoscopy in January 2026.  Currently due for surveillance given history of hyperplastic colon polyp.   Ebony Melia, MSN, FNP-BC, AGACNP-BC [facility]

## 2024-09-12 ENCOUNTER — Encounter (INDEPENDENT_AMBULATORY_CARE_PROVIDER_SITE_OTHER): Payer: Self-pay | Admitting: *Deleted

## 2024-09-21 ENCOUNTER — Encounter: Payer: Self-pay | Admitting: Gastroenterology

## 2024-09-21 ENCOUNTER — Other Ambulatory Visit: Payer: Self-pay | Admitting: *Deleted

## 2024-09-21 DIAGNOSIS — K7460 Unspecified cirrhosis of liver: Secondary | ICD-10-CM

## 2024-09-22 ENCOUNTER — Other Ambulatory Visit: Payer: Self-pay

## 2024-09-22 ENCOUNTER — Telehealth: Admitting: *Deleted

## 2024-09-22 NOTE — Patient Outreach (Signed)
 Complex Care Management   Visit Note  09/22/2024  Name:  Ebony Stevens MRN: [id number] DOB: 11-Apr-1951  Situation: Referral received for Complex Care Management related to Diabetes with Complications I obtained verbal consent from Patient.  Visit completed with Patient  on the phone  Background:   Past Medical History:  Diagnosis Date   Asthma    Chest pain 07/11/2015   Diabetes mellitus without complication (HCC)    Dyspnea 07/11/2015   Early cirrhosis (HCC) 2020   Excessive bleeding in the premenopausal period    Fibromyalgia    GERD (gastroesophageal reflux disease)    HTN (hypertension)    Hypertension    Localized edema    Mixed hyperlipidemia    PONV (postoperative nausea and vomiting)    Sleep apnea    Type 2 diabetes mellitus (HCC)     Assessment: Patient Reported Symptoms:  Cognitive Cognitive Status: Alert and oriented to person, place, and time, Normal speech and language skills Cognitive/Intellectual Conditions Management [RPT]: None reported or documented in medical history or problem list   Health Maintenance Behaviors: Annual physical exam, Exercise, Healthy diet, Hobbies, Immunizations, Sleep adequate, Social activities Healing Pattern: Average Health Facilitated by: Pain control, Prayer/meditation  Neurological Neurological Review of Symptoms: Dizziness Neurological Management Strategies: Coping strategies, Routine screening Neurological Self-Management Outcome: 4 (good) Neurological Comment: Ardiac related-  On Heart Monitor  HEENT HEENT Symptoms Reported: No symptoms reported HEENT Management Strategies: Adequate rest, Coping strategies, Routine screening HEENT Self-Management Outcome: 4 (good)    Cardiovascular Cardiovascular Symptoms Reported: Dizziness, Other: Other Cardiovascular Symptoms: Heart Rate low  Heart Monitor  Cards appt 10-02-2024 Does patient have uncontrolled Hypertension?: No Cardiovascular Management Strategies: Routine  screening, Coping strategies Do You Have a Working Readable Scale?: Yes Weight: 158 lb (71.7 kg) Cardiovascular Self-Management Outcome: 4 (good)  Respiratory Respiratory Symptoms Reported: No symptoms reported Respiratory Management Strategies: Adequate rest, Routine screening Respiratory Self-Management Outcome: 4 (good)  Endocrine Is patient diabetic?: Yes Is patient checking blood sugars at home?: Yes List most recent blood sugar readings, include date and time of day: A1C 5.2 Endocrine Self-Management Outcome: 5 (very good) Endocrine Comment: LAst A1C Discontinued Metformin  Gastrointestinal Gastrointestinal Symptoms Reported: Reflux/heartburn, Constipation Gastrointestinal Management Strategies: Adequate rest, Coping strategies, Medication therapy Gastrointestinal Self-Management Outcome: 4 (good) Nutrition Risk Screen (CP): No indicators present  Genitourinary Genitourinary Symptoms Reported: No symptoms reported Genitourinary Management Strategies: Activity, Adequate rest, Coping strategies Genitourinary Self-Management Outcome: 4 (good)  Integumentary Integumentary Symptoms Reported: No symptoms reported Other Integumentary Symptoms: Left finger healed Skin Management Strategies: Adequate rest, Routine screening Skin Self-Management Outcome: 4 (good)  Musculoskeletal Musculoskelatal Symptoms Reviewed: No symptoms reported Musculoskeletal Management Strategies: Adequate rest, Coping strategies Musculoskeletal Self-Management Outcome: 4 (good) Falls in the past year?: No Number of falls in past year: 1 or less Was there an injury with Fall?: No Fall Risk Category Calculator: 0 Patient Fall Risk Level: Low Fall Risk Patient at Risk for Falls Due to: No Fall Risks Fall risk Follow up: Falls evaluation completed  Psychosocial Psychosocial Symptoms Reported: No symptoms reported Behavioral Management Strategies: Activity, Adequate rest Behavioral Health Self-Management Outcome:  4 (good)   Quality of Family Relationships: helpful, supportive Do you feel physically threatened by others?: No    09/22/2024    PHQ2-9 Depression Screening   Little interest or pleasure in doing things Not at all  Feeling down, depressed, or hopeless Not at all  PHQ-2 - Total Score 0  Trouble falling or staying asleep, or sleeping too  much    Feeling tired or having little energy    Poor appetite or overeating     Feeling bad about yourself - or that you are a failure or have let yourself or your family down    Trouble concentrating on things, such as reading the newspaper or watching television    Moving or speaking so slowly that other people could have noticed.  Or the opposite - being so fidgety or restless that you have been moving around a lot more than usual    Thoughts that you would be better off dead, or hurting yourself in some way    PHQ2-9 Total Score    If you checked off any problems, how difficult have these problems made it for you to do your work, take care of things at home, or get along with other people    Depression Interventions/Treatment      Today's Vitals   09/22/24 1508  Weight: 158 lb (71.7 kg)  Height: 5' 2 (1.575 m)   Pain Scale: 0-10 Pain Score: 0-No pain  Medications Reviewed Today     Reviewed by Kay Hendricks MATSU, RN (Case Manager) on 09/22/24 at 1503  Med List Status: <None>   Medication Order Taking? Sig Documenting Provider Last Dose Status Informant  acetaminophen  (TYLENOL ) 500 MG tablet [id number] Yes Take 500 mg by mouth every 6 (six) hours as needed for moderate pain. [provider]  Active Self, Pharmacy Records  aspirin  325 MG tablet [id number]  Take 325 mg by mouth.  Patient not taking: Reported on 09/22/2024   [provider]  Active   aspirin  EC 325 MG tablet [id number]  Take 325 mg by mouth daily.  Patient not taking: Reported on 09/22/2024   [provider]  Active Self, Pharmacy Records  atorvastatin   (LIPITOR) 40 MG tablet [id number] Yes Take 40 mg by mouth daily. [provider]  Active Self, Pharmacy Records  Calcium  Carbonate-Vitamin D (CALTRATE 600+D PO) [id number] Yes Take 1 tablet by mouth daily. [provider]  Active Self, Pharmacy Records  cetirizine (ZYRTEC) 10 MG tablet [id number] Yes Take 10 mg by mouth daily. [provider]  Active Self, Pharmacy Records  cetirizine (ZYRTEC) 10 MG tablet [id number]  Take 10 mg by mouth.  Patient not taking: Reported on 09/22/2024   [provider]  Active   estradiol (ESTRACE) 1 MG tablet [id number] Yes Take 1 tablet by mouth at bedtime. [provider]  Active Self, Pharmacy Records  furosemide  (LASIX ) 40 MG tablet [id number] Yes TAKE ONE TABLET BY MOUTH EVERY OTHER DAY Okey Vina GAILS, MD  Active Self, Pharmacy Records  loperamide (IMODIUM A-D) 2 MG tablet [id number]  Take 2 mg by mouth as needed for diarrhea or loose stools.  Patient not taking: Reported on 09/22/2024   [provider]  Active Self, Pharmacy Records  metFORMIN (GLUCOPHAGE-XR) 500 MG 24 hr tablet [id number]  Take 500 mg by mouth daily.  Patient not taking: Reported on 09/22/2024   [provider]  Consider Medication Status and Discontinue Self, Pharmacy Records  [facility] 5 MG/0.5ML Pen [id number] Yes Inject 5 mg into the skin once a week. [provider]  Active Self, Pharmacy Records  pantoprazole  (PROTONIX ) 40 MG tablet [id number] Yes Take 1 tablet (40 mg total) by mouth daily. Kennedy Charmaine CROME, NP  Active Self, Pharmacy Records  sodium chloride  1 g tablet [id number] Yes Take 1 g by mouth 3 (three) times daily with meals. [provider]  Active   sodium chloride  1 g tablet [id number]  Take 1 g by mouth.  Patient not taking: Reported on 09/22/2024   [provider]  Active   spironolactone  (ALDACTONE ) 50 MG tablet [id number] Yes Take 1 tablet (50 mg total) by mouth daily. Kennedy Charmaine CROME, NP   Active Self, Pharmacy Records              Follow Up Plan:   Patient has met all care management goals. Care Management case will be closed. Patient has been provided contact information should new needs arise.   Hendricks Her RN, BSN  [place] [facility] RN Case Information Systems Manager [phone]

## 2024-09-22 NOTE — Patient Instructions (Signed)
 Visit Information  Thank you for taking time to visit with me today. Please don't hesitate to contact me if I can be of assistance to you before our next scheduled appointment.   Patient has met all care management goals. Care Management case will be closed. Patient has been provided contact information should new needs arise.   Please call the care guide team at [phone] if you need to cancel, schedule, or reschedule an appointment.   Please call the Suicide and Crisis Lifeline: 988 call the [place]  National Suicide Prevention Lifeline: (669)065-5697 or TTY: 9088771526 TTY (941)443-7234) to talk to a trained counselor call 1-800-273-TALK (toll free, 24 hour hotline) call 911 if you are experiencing a Mental Health or Behavioral Health Crisis or need someone to talk to. Hendricks Her RN, BSN  [place] [facility] RN Case Information Systems Manager [phone]

## 2024-09-26 NOTE — Telephone Encounter (Signed)
 Left voicemail regarding patient stress test tomorrow. Instructed on to be nothing by mouth after midnight and what medicines to hold. Instructed to call back with any questions.  Outgoing call

## 2024-09-30 LAB — CBC
Hematocrit: 40.7 % (ref 34.0–46.6)
Hemoglobin: 13.3 g/dL (ref 11.1–15.9)
MCH: 30.8 pg (ref 26.6–33.0)
MCHC: 32.7 g/dL (ref 31.5–35.7)
MCV: 94 fL (ref 79–97)
Platelets: 202 x10E3/uL (ref 150–450)
RBC: 4.32 x10E6/uL (ref 3.77–5.28)
RDW: 11.8 % (ref 11.7–15.4)
WBC: 4.4 x10E3/uL (ref 3.4–10.8)

## 2024-09-30 LAB — COMPREHENSIVE METABOLIC PANEL WITH GFR
ALT: 20 IU/L (ref 0–32)
AST: 25 IU/L (ref 0–40)
Albumin: 4.1 g/dL (ref 3.8–4.8)
Alkaline Phosphatase: 133 IU/L (ref 49–135)
BUN/Creatinine Ratio: 17 (ref 12–28)
BUN: 18 mg/dL (ref 8–27)
Bilirubin Total: 0.5 mg/dL (ref 0.0–1.2)
CO2: 25 mmol/L (ref 20–29)
Calcium: 9.5 mg/dL (ref 8.7–10.3)
Chloride: 95 mmol/L — ABNORMAL LOW (ref 96–106)
Creatinine, Ser: 1.06 mg/dL — ABNORMAL HIGH (ref 0.57–1.00)
Globulin, Total: 2.6 g/dL (ref 1.5–4.5)
Glucose: 114 mg/dL — ABNORMAL HIGH (ref 70–99)
Potassium: 4 mmol/L (ref 3.5–5.2)
Sodium: 134 mmol/L (ref 134–144)
Total Protein: 6.7 g/dL (ref 6.0–8.5)
eGFR: 56 mL/min/1.73 — ABNORMAL LOW

## 2024-09-30 LAB — AFP TUMOR MARKER: AFP, Serum, Tumor Marker: 2.8 ng/mL (ref 0.0–9.2)

## 2024-09-30 LAB — PROTIME-INR
INR: 1 (ref 0.9–1.2)
Prothrombin Time: 10.9 s (ref 9.1–12.0)

## 2024-10-02 ENCOUNTER — Encounter: Payer: Self-pay | Admitting: Cardiovascular Disease

## 2024-10-02 ENCOUNTER — Ambulatory Visit: Attending: Cardiovascular Disease | Admitting: Cardiovascular Disease

## 2024-10-02 VITALS — BP 146/50 | HR 43 | Ht 62.0 in | Wt 154.0 lb

## 2024-10-02 DIAGNOSIS — I4589 Other specified conduction disorders: Secondary | ICD-10-CM | POA: Diagnosis not present

## 2024-10-02 DIAGNOSIS — R55 Syncope and collapse: Secondary | ICD-10-CM | POA: Diagnosis not present

## 2024-10-02 DIAGNOSIS — R001 Bradycardia, unspecified: Secondary | ICD-10-CM | POA: Diagnosis not present

## 2024-10-02 NOTE — Progress Notes (Signed)
 " Electrophysiology Office Note:    Date:  10/02/2024   ID:  Ebony Stevens, Ebony Stevens 07-29-1951, MRN [id number]  PCP:  Shona Norleen PEDLAR, MD   [facility] Providers Cardiologist:  Diannah SHAUNNA Maywood, MD Electrophysiologist:  Eulas FORBES Furbish, MD     Referring MD: Maywood Diannah SHAUNNA, MD   History of Present Illness:    Ebony Stevens is a 73 y.o. female with a medical history significant for sinus bradycardia, referred for arrhythmia management.     Discussed the use of AI scribe software for clinical note transcription with the patient, who gave verbal consent to proceed.  History of Present Illness Ebony Stevens is a 73 year old female who presents with slow heart rates for evaluation of potential pacemaker placement. She was referred by Dr. Cheril for evaluation of slow heart rates and consideration of pacemaker placement.  She experiences home-monitored bradycardia typically in the 40s. With walking her heart rate rises to the low 100s but quickly drops back to the 40s when she sits, and she has dizziness and staggering while walking.  Since May she has tracked heart rates as low as 36 bpm. When her heart rate falls into the 30s she feels fatigued and real bad.  Over the past year she has had significant weight loss from a drastic diet and use of Mounjaro and notes more overall energy despite the slow heart rate.           Today, she reports that she is at baseline and has no new complaints.  EKGs/Labs/Other Studies Reviewed Today:     Echocardiogram:  TTE October 2024 LVEF 60 to 65%.  Aortic sclerosis with no stenosis.   Monitors:  12 day monitor June 2025-- my interpretation Sinus rhythm, heart rate 35 to 96 bpm, average 49 bpm.  5% PAC burden.  Symptoms of shortness of breath, palpitations are associated with bradycardia and atrial ectopy  Stress testing:  Nuclear stress test August 2025  Maximum heart rate 84 bpm.    Advanced  imaging:  Cardiac CT August 2025 Mild nonobstructive coronary disease.    EKG:   EKG Interpretation Date/Time:  Monday October 02 2024 10:49:45 EST Ventricular Rate:  44 PR Interval:  164 QRS Duration:  92 QT Interval:  484 QTC Calculation: 413 R Axis:   89  Text Interpretation: Marked sinus bradycardia When compared with ECG of 02-Oct-2024 10:49, No significant change was found Confirmed by Furbish Eulas [phone]) on 10/02/2024 11:04:07 AM     Physical Exam:    VS:  BP (!) 146/50 (BP Location: Left Arm, Patient Position: Sitting, Cuff Size: Normal)   Pulse (!) 43   Ht 5' 2 (1.575 m)   Wt 154 lb (69.9 kg)   SpO2 97%   BMI 28.17 kg/m     Wt Readings from Last 3 Encounters:  10/02/24 154 lb (69.9 kg)  09/22/24 158 lb (71.7 kg)  07/24/24 163 lb 12.8 oz (74.3 kg)     GEN: Well nourished, well developed in no acute distress CARDIAC: RRR, no murmurs, rubs, gallops RESPIRATORY:  Normal work of breathing MUSCULOSKELETAL: no edema    ASSESSMENT & PLAN:     Bradycardia Resting heart rates in the low 40s Symptoms uncertain.  I suspect that she has some degree of fatigue No syncope, presyncope We discussed the indication for pacemaker.  I explained that I did not think that she was at imminent life-threatening risk, but she does have risk of needing  urgent hospitalization if her sinus node dysfunction progresses. I explained the pacemaker procedure including risks of infection, bleeding, and a low but nonzero risk of prolonged hospital stay, need for repeat procedure or cardiac surgery, and a very low risk of a life-threatening complication.  Obesity She reports she has had significant weight loss with Mounjaro and dietary changes    Signed, Eulas FORBES Furbish, MD  10/02/2024 11:27 AM    [facility] "

## 2024-10-02 NOTE — Patient Instructions (Signed)
 Medication Instructions:  Your physician recommends that you continue on your current medications as directed. Please refer to the Current Medication list given to you today.  *If you need a refill on your cardiac medications before your next appointment, please call your pharmacy*  Testing/Procedures: Pacemaker Your physician has recommended that you have a pacemaker inserted. A pacemaker is a small device that is placed under the skin of your chest or abdomen to help control abnormal heart rhythms. This device uses electrical pulses to prompt the heart to beat at a normal rate. Pacemakers are used to treat heart rhythms that are too slow. Wire (leads) are attached to the pacemaker that goes into the chambers of you heart. This is done in the hospital and usually requires and overnight stay.   Follow-Up: At [facility], you and your health needs are our priority.  As part of our continuing mission to provide you with exceptional heart care, our providers are all part of one team.  This team includes your primary Cardiologist (physician) and Advanced Practice Providers or APPs (Physician Assistants and Nurse Practitioners) who all work together to provide you with the care you need, when you need it.  Your next appointment:   As needed with Dr. Nancey - call us  if you would like to proceed with getting a Pacemaker

## 2024-10-13 ENCOUNTER — Telehealth: Payer: Self-pay | Admitting: Cardiovascular Disease

## 2024-10-13 ENCOUNTER — Ambulatory Visit: Payer: Self-pay | Admitting: Gastroenterology

## 2024-10-13 DIAGNOSIS — R42 Dizziness and giddiness: Secondary | ICD-10-CM

## 2024-10-13 DIAGNOSIS — Z01812 Encounter for preprocedural laboratory examination: Secondary | ICD-10-CM

## 2024-10-13 DIAGNOSIS — R55 Syncope and collapse: Secondary | ICD-10-CM

## 2024-10-13 DIAGNOSIS — R001 Bradycardia, unspecified: Secondary | ICD-10-CM

## 2024-10-13 NOTE — Telephone Encounter (Signed)
 Pt states she would like to start process for getting pacemaker, as discussed at last visit. Pt needed further clarification on what this procedure entails. Will send to Dr. Nancey for further assistance. Will also send to Dr. Mallipeddi as he has been seeing patient also. Pt verbalizes understanding of plan.

## 2024-10-13 NOTE — Telephone Encounter (Signed)
 Patient would like to discuss getting a PPM. She says this was discussed during last visit with Dr. Nancey.

## 2024-10-16 ENCOUNTER — Other Ambulatory Visit: Payer: Self-pay | Admitting: Gastroenterology

## 2024-10-16 DIAGNOSIS — K219 Gastro-esophageal reflux disease without esophagitis: Secondary | ICD-10-CM

## 2024-10-17 NOTE — Telephone Encounter (Signed)
 Spoke with pt who states she is ready to proceed with scheduling of pacemaker.  Procedure scheduled for 11/21/2024 with Dr Nancey.  Pt advised will complete instruction letter will be mailed to her as requested. Pt will be contacted to review instructions.  Pt verbalizes understanding and agrees with current plan.

## 2024-10-17 NOTE — Addendum Note (Signed)
 Addended by: CASIMIR ALDONA BRAVO on: 10/17/2024 09:46 AM   Modules accepted: Orders

## 2024-10-19 ENCOUNTER — Telehealth: Payer: Self-pay

## 2024-10-19 NOTE — Telephone Encounter (Signed)
-----   Message from Nurse Aldona SAUNDERS, RN sent at 10/17/2024  9:39 AM EST ----- Regarding: PPM -I Mealor 11/21/24 Important: list procedure date as first item in subject line, followed by procedure type (e.g., 06/23/24 PPM implant)  Precert:  MD: Mealor Type of implant: PPM Device manufacturer:  unknown Diagnosis: Bradycardia CPT code: PPM implant, dual - 66791 C-code(s), including quantity (if indicated):  Procedure scheduled (date/time): 11/21/24 130pm  Procedure:  Scrub given? No  Medication instructions: hold your Aspirin  x   _________ days??    CVRR? No Added to calendar? Yes Orders entered? No, >30 days before procedure Letter complete? No, >30 days before procedure Scheduled with cath lab? Yes Labs ordered (CBC, BMET, PT/INR if on warfarin)? Yes Dye allergy? No Pre-meds ordered and instructions given? N/A Letter method: Mail per pt request Special instructions: N/A H&P: 12/22  Follow-up:  Cassie/Angel, please schedule Routine.  Covering RN: Aldona  Please send this message to [employer], EP scheduler, EP Scheduling pool, and EP [employer].

## 2024-10-24 ENCOUNTER — Ambulatory Visit (HOSPITAL_COMMUNITY)
Admission: RE | Admit: 2024-10-24 | Discharge: 2024-10-24 | Disposition: A | Discharge status: Home / Self Care | Source: Ambulatory Visit | Attending: Gastroenterology | Admitting: Gastroenterology

## 2024-10-24 DIAGNOSIS — K7460 Unspecified cirrhosis of liver: Secondary | ICD-10-CM | POA: Diagnosis present

## 2024-10-26 ENCOUNTER — Telehealth (HOSPITAL_COMMUNITY): Payer: Self-pay

## 2024-10-26 ENCOUNTER — Other Ambulatory Visit (HOSPITAL_COMMUNITY): Payer: Self-pay

## 2024-10-26 DIAGNOSIS — R42 Dizziness and giddiness: Secondary | ICD-10-CM

## 2024-10-26 DIAGNOSIS — R001 Bradycardia, unspecified: Secondary | ICD-10-CM

## 2024-10-26 DIAGNOSIS — R55 Syncope and collapse: Secondary | ICD-10-CM

## 2024-10-26 DIAGNOSIS — Z01812 Encounter for preprocedural laboratory examination: Secondary | ICD-10-CM

## 2024-10-26 NOTE — Telephone Encounter (Signed)
 Spoke with patient to discuss upcoming procedure.   Confirmed patient is scheduled for a Permanent transvenous pacemaker (PPM) on Tuesday, February 10 with Dr. Dr. Nancey. Instructed patient to arrive at the Main Entrance A at [facility]: [address] [place], [place] [address] and check in at Admitting at 11:30 AM.   Labs to be completed  Any recent signs of acute illness or been started on antibiotics? No  Any new medications started? No Any medications to hold? Yes.  HOLD Tirzepatide (Mounjaro, Zepbound) for 7 days prior to your procedure. Last dose on or before February 2.  Medication instructions:  On the morning of your procedure HOLD Lasix , Spironolactone , Metformin and ASA. Take all other medications with sips of water.  You may have a LIGHT breakfast prior to 7:00am on the morning of your procedure. Nothing to eat or drink after 7:00am except for sips of water with your medications.   The night before your procedure and the morning of your procedure, wash thoroughly with the CHG surgical soap from the neck down, paying special attention to the area where your procedure will be performed.  Plan to go home the same day, you will only stay overnight if medically necessary. You MUST have a responsible adult to drive you home and MUST be with you the first 24 hours after you arrive home.  Informed patient a nurse will call a day before the procedure to confirm arrival time and ensure instructions are followed.  Patient verbalized understanding to all instructions provided and agreed to proceed with procedure.

## 2024-11-07 NOTE — Progress Notes (Signed)
 I NIC'd the US  abd for 6 months

## 2024-11-10 ENCOUNTER — Ambulatory Visit: Payer: Self-pay

## 2024-11-10 LAB — BASIC METABOLIC PANEL WITH GFR
BUN/Creatinine Ratio: 23 (ref 12–28)
BUN: 24 mg/dL (ref 8–27)
CO2: 23 mmol/L (ref 20–29)
Calcium: 9.2 mg/dL (ref 8.7–10.3)
Chloride: 99 mmol/L (ref 96–106)
Creatinine, Ser: 1.04 mg/dL — ABNORMAL HIGH (ref 0.57–1.00)
Glucose: 76 mg/dL (ref 70–99)
Potassium: 4.3 mmol/L (ref 3.5–5.2)
Sodium: 138 mmol/L (ref 134–144)
eGFR: 57 mL/min/{1.73_m2} — ABNORMAL LOW

## 2024-11-10 LAB — CBC
Hematocrit: 39.9 % (ref 34.0–46.6)
Hemoglobin: 13 g/dL (ref 11.1–15.9)
MCH: 30.6 pg (ref 26.6–33.0)
MCHC: 32.6 g/dL (ref 31.5–35.7)
MCV: 94 fL (ref 79–97)
Platelets: 150 10*3/uL (ref 150–450)
RBC: 4.25 x10E6/uL (ref 3.77–5.28)
RDW: 12.5 % (ref 11.7–15.4)
WBC: 4.6 10*3/uL (ref 3.4–10.8)

## 2024-11-21 ENCOUNTER — Encounter (HOSPITAL_COMMUNITY): Admission: RE | Payer: Self-pay | Source: Home / Self Care

## 2024-11-21 ENCOUNTER — Ambulatory Visit (HOSPITAL_COMMUNITY): Admission: RE | Admit: 2024-11-21 | Admitting: Cardiovascular Disease

## 2024-11-21 SURGERY — PACEMAKER IMPLANT
Anesthesia: LOCAL

## 2024-12-26 ENCOUNTER — Other Ambulatory Visit (HOSPITAL_COMMUNITY)

## 2025-01-03 ENCOUNTER — Ambulatory Visit: Admitting: Urology
